# Patient Record
Sex: Female | Born: 1990 | Race: White | Hispanic: No | Marital: Married | Smoking: Never smoker
Health system: Southern US, Community
[De-identification: ages and names within clinical notes are randomized; demographics above are authoritative.]

## PROBLEM LIST (undated history)

## (undated) DIAGNOSIS — L309 Dermatitis, unspecified: Secondary | ICD-10-CM

## (undated) DIAGNOSIS — G43909 Migraine, unspecified, not intractable, without status migrainosus: Secondary | ICD-10-CM

## (undated) DIAGNOSIS — C439 Malignant melanoma of skin, unspecified: Secondary | ICD-10-CM

## (undated) DIAGNOSIS — D649 Anemia, unspecified: Secondary | ICD-10-CM

## (undated) DIAGNOSIS — Z8742 Personal history of other diseases of the female genital tract: Secondary | ICD-10-CM

## (undated) DIAGNOSIS — N809 Endometriosis, unspecified: Secondary | ICD-10-CM

## (undated) DIAGNOSIS — R7303 Prediabetes: Secondary | ICD-10-CM

## (undated) HISTORY — DX: Anemia, unspecified: D64.9

## (undated) HISTORY — DX: Malignant melanoma of skin, unspecified: C43.9

## (undated) HISTORY — DX: Prediabetes: R73.03

## (undated) HISTORY — DX: Dermatitis, unspecified: L30.9

## (undated) HISTORY — DX: Migraine, unspecified, not intractable, without status migrainosus: G43.909

## (undated) HISTORY — DX: Endometriosis, unspecified: N80.9

## (undated) HISTORY — DX: Personal history of other diseases of the female genital tract: Z87.42

---

## 2010-10-27 HISTORY — PX: LAPAROSCOPIC OVARIAN CYSTECTOMY: SUR786

## 2010-10-27 HISTORY — PX: ABLATION ON ENDOMETRIOSIS: SHX5787

## 2011-05-08 ENCOUNTER — Ambulatory Visit: Payer: Self-pay

## 2011-06-02 ENCOUNTER — Ambulatory Visit: Payer: Self-pay | Admitting: Obstetrics and Gynecology

## 2011-06-03 ENCOUNTER — Ambulatory Visit: Payer: Self-pay | Admitting: Obstetrics and Gynecology

## 2011-06-06 LAB — PATHOLOGY REPORT

## 2011-10-31 ENCOUNTER — Emergency Department: Payer: Self-pay | Admitting: Emergency Medicine

## 2013-05-27 ENCOUNTER — Encounter: Payer: Self-pay | Admitting: Adult Health

## 2013-05-27 ENCOUNTER — Ambulatory Visit (INDEPENDENT_AMBULATORY_CARE_PROVIDER_SITE_OTHER): Payer: BC Managed Care – PPO | Admitting: Adult Health

## 2013-05-27 VITALS — BP 102/58 | HR 82 | Temp 98.1°F | Resp 12 | Ht 60.5 in | Wt 129.5 lb

## 2013-05-27 DIAGNOSIS — Z0000 Encounter for general adult medical examination without abnormal findings: Secondary | ICD-10-CM

## 2013-05-27 DIAGNOSIS — R5383 Other fatigue: Secondary | ICD-10-CM | POA: Insufficient documentation

## 2013-05-27 DIAGNOSIS — R5381 Other malaise: Secondary | ICD-10-CM

## 2013-05-27 DIAGNOSIS — G479 Sleep disorder, unspecified: Secondary | ICD-10-CM

## 2013-05-27 NOTE — Assessment & Plan Note (Signed)
Multifactorial from difficulty sleeping, stress from work. Will check cbc, tsh and vit D level.

## 2013-05-27 NOTE — Progress Notes (Signed)
Subjective:    Patient ID: Heather Conner, female    DOB: 09/19/1991, 22 y.o.   MRN: [id number]  HPI  Patient is a pleasant 22 y/o female who presents to clinic to establish care. Previously followed at [facility] in [place]. She is also followed for her GYN needs at [facility]. She reports having some trouble sleeping. 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 This "wires" her and then she just lays there without being able to fall asleep. She has not tried any OTC products. She reports exercising a little but not as much as she should.    Past Medical History  Diagnosis Date  . Migraine     On continuous BC for prevention    Past Surgical History  Procedure Laterality Date  . Laparoscopic ovarian cystectomy Right 2012    Family History  Problem Relation Age of Onset  . Hypertension Mother   . Diabetes Paternal Grandfather   . Stroke Paternal Grandfather     History   Social History  . Marital Status: Single    Spouse Name: Aquita Simmering - Fiance    Number of Children: N/A  . Years of Education: 16   Occupational History  . Second Grade Teacher [employer]   Social History Main Topics  . Smoking status: Never Smoker   . Smokeless tobacco: Never Used  . Alcohol Use: No  . Drug Use: No  . Sexually Active: Yes -- Female partner(s)    Birth Control/ Protection: Pill   Other Topics Concern  . Not on file   Social History Narrative   [name] was born and reared in [place], [place]. She graduated from [employer] in 2008 and then went to [employer] and obtained her Bachelors in [employer] in 2012. She currently lives at home with her parents. She is engaged to be married to Alinda Sierras in May 2015. She has a turtle named Crush. 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 She enjoys photography, hanging out with friends. She enjoys action movies. 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    Review of Systems  Constitutional: Positive for fatigue.  Eyes: Negative.   Respiratory: Negative.   Cardiovascular: Negative.   Gastrointestinal: Negative.   Endocrine: Negative.   Genitourinary: Negative.   Musculoskeletal: Negative.   Skin: Negative for color change, pallor, rash and wound.       Eczema worse in the winter. Appears on elbows and legs. Mild symptoms. Uses Aveno cream.  Allergic/Immunologic:       Seasonal allergies  Neurological: Positive for headaches. Negative for dizziness, tremors, seizures, syncope, weakness, light-headedness and numbness.       Hx of migraine HA - well controlled.  Hematological: Negative.   Psychiatric/Behavioral: Positive for sleep disturbance. Negative for suicidal ideas, hallucinations, behavioral problems, confusion, self-injury, decreased concentration and agitation. The patient is nervous/anxious.     BP 102/58  Pulse 82  Temp(Src) 98.1 F (36.7 C) (Oral)  Resp 12  Ht 5' 0.5" (1.537 m)  Wt 129 lb 8 oz (58.741 kg)  BMI 24.87 kg/m2  SpO2 99%    Objective:   Physical Exam  Constitutional: She is oriented to person, place, and time. She appears well-developed and well-nourished. No distress.  HENT:  Head: Normocephalic and atraumatic.  Right Ear: External ear normal.  Left Ear: External ear normal.  Nose: Nose normal.  Mouth/Throat: Oropharynx is clear and moist.  Eyes: Conjunctivae and EOM are normal. Pupils are equal, round, and reactive to light.  Neck: Normal range of motion. Neck supple. No thyromegaly present.  Cardiovascular: Normal rate, regular rhythm, normal heart sounds and intact distal pulses.  Exam reveals no gallop and no friction rub.   No murmur heard. Pulmonary/Chest: Effort normal and breath sounds normal. No respiratory distress. She has no wheezes. She has no rales. She exhibits no tenderness.  Abdominal: Soft. Bowel sounds are normal. She  exhibits no distension and no mass. There is no tenderness. There is no rebound and no guarding.  Musculoskeletal: Normal range of motion. She exhibits no edema and no tenderness.  Lymphadenopathy:    She has no cervical adenopathy.  Neurological: She is alert and oriented to person, place, and time. She has normal reflexes. She displays normal reflexes. No cranial nerve deficit. She exhibits normal muscle tone. Coordination normal.  Skin: Skin is warm and dry. No rash noted. No erythema. No pallor.  Psychiatric: She has a normal mood and affect. Her behavior is normal. Judgment and thought content normal.      Assessment & Plan:

## 2013-05-27 NOTE — Assessment & Plan Note (Addendum)
Normal physical exam. Check labs: cbc w/diff, bmet, hepatic panel, tsh, lipids, vit D. Pt is not fasting so she will return for all labs next week.

## 2013-05-27 NOTE — Assessment & Plan Note (Signed)
Decrease stimulating activity too close to bedtime. Exercise to help reduce stress/tension (but not close to bedtime). Try melatonin or valerian root for sleep.

## 2013-05-27 NOTE — Patient Instructions (Addendum)
   Thank you for choosing [place] [facility] for your health care needs.  Please return at your earliest convenience for fasting labs. Do not have anything to eat or drink for 8 hours prior. You can drink water.  The results will be available through MyChart for your convenience. Please remember to activate this. The activation code is located at the end of this form.  For sleep try Melatonin 2mg . You can increase the dose if necessary.  You can also try Alteril which has melatonin and valerian root as ingedients.  Avoid stimulating activities too close to bedtime.  Have fun at the beach and remember the sunblock :)

## 2013-06-01 ENCOUNTER — Other Ambulatory Visit (INDEPENDENT_AMBULATORY_CARE_PROVIDER_SITE_OTHER): Payer: BC Managed Care – PPO

## 2013-06-01 ENCOUNTER — Other Ambulatory Visit: Payer: Self-pay | Admitting: Adult Health

## 2013-06-01 DIAGNOSIS — R5383 Other fatigue: Secondary | ICD-10-CM

## 2013-06-01 DIAGNOSIS — Z0000 Encounter for general adult medical examination without abnormal findings: Secondary | ICD-10-CM

## 2013-06-01 DIAGNOSIS — R5381 Other malaise: Secondary | ICD-10-CM

## 2013-06-01 DIAGNOSIS — D649 Anemia, unspecified: Secondary | ICD-10-CM

## 2013-06-01 LAB — BASIC METABOLIC PANEL
CO2: 24 mEq/L (ref 19–32)
Calcium: 9.7 mg/dL (ref 8.4–10.5)
Chloride: 106 mEq/L (ref 96–112)
Potassium: 4.1 mEq/L (ref 3.5–5.1)
Sodium: 136 mEq/L (ref 135–145)

## 2013-06-01 LAB — CBC WITH DIFFERENTIAL/PLATELET
Basophils Relative: 0.6 % (ref 0.0–3.0)
Eosinophils Absolute: 0.1 10*3/uL (ref 0.0–0.7)
Hemoglobin: 10.9 g/dL — ABNORMAL LOW (ref 12.0–15.0)
Lymphs Abs: 2.6 10*3/uL (ref 0.7–4.0)
MCHC: 34.2 g/dL (ref 30.0–36.0)
MCV: 93.1 fl (ref 78.0–100.0)
Monocytes Absolute: 0.6 10*3/uL (ref 0.1–1.0)
Neutro Abs: 2.5 10*3/uL (ref 1.4–7.7)
RBC: 3.41 Mil/uL — ABNORMAL LOW (ref 3.87–5.11)
RDW: 12.7 % (ref 11.5–14.6)

## 2013-06-01 LAB — HEPATIC FUNCTION PANEL
ALT: 12 U/L (ref 0–35)
Alkaline Phosphatase: 60 U/L (ref 39–117)
Bilirubin, Direct: 0.1 mg/dL (ref 0.0–0.3)
Total Protein: 6.8 g/dL (ref 6.0–8.3)

## 2013-06-01 LAB — LIPID PANEL: Total CHOL/HDL Ratio: 4

## 2013-06-03 ENCOUNTER — Other Ambulatory Visit (INDEPENDENT_AMBULATORY_CARE_PROVIDER_SITE_OTHER): Payer: BC Managed Care – PPO

## 2013-06-03 DIAGNOSIS — D649 Anemia, unspecified: Secondary | ICD-10-CM

## 2013-06-04 LAB — IRON AND TIBC
%SAT: 36 % (ref 20–55)
Iron: 123 ug/dL (ref 42–145)

## 2013-06-04 LAB — VITAMIN B12: Vitamin B-12: 370 pg/mL (ref 211–911)

## 2013-06-09 ENCOUNTER — Encounter: Payer: Self-pay | Admitting: Adult Health

## 2013-09-01 ENCOUNTER — Other Ambulatory Visit: Payer: Self-pay

## 2015-09-11 LAB — HM PAP SMEAR: HM PAP: NORMAL

## 2015-09-19 ENCOUNTER — Ambulatory Visit (INDEPENDENT_AMBULATORY_CARE_PROVIDER_SITE_OTHER): Payer: BC Managed Care – PPO | Admitting: Nurse Practitioner

## 2015-09-19 ENCOUNTER — Encounter: Payer: Self-pay | Admitting: Nurse Practitioner

## 2015-09-19 ENCOUNTER — Encounter (INDEPENDENT_AMBULATORY_CARE_PROVIDER_SITE_OTHER): Payer: Self-pay

## 2015-09-19 VITALS — BP 104/68 | HR 75 | Temp 98.6°F | Resp 12 | Ht 61.0 in | Wt 144.8 lb

## 2015-09-19 DIAGNOSIS — Z0000 Encounter for general adult medical examination without abnormal findings: Secondary | ICD-10-CM

## 2015-09-19 LAB — LIPID PANEL
CHOLESTEROL: 188 mg/dL (ref 0–200)
HDL: 48.5 mg/dL (ref >39.00)
LDL Cholesterol: 116 mg/dL — ABNORMAL HIGH (ref 0–99)
NONHDL: 139.59
Total CHOL/HDL Ratio: 4
Triglycerides: 117 mg/dL (ref 0.0–149.0)
VLDL: 23.4 mg/dL (ref 0.0–40.0)

## 2015-09-19 LAB — COMPREHENSIVE METABOLIC PANEL
ALK PHOS: 76 U/L (ref 39–117)
ALT: 13 U/L (ref 0–35)
AST: 16 U/L (ref 0–37)
Albumin: 4.1 g/dL (ref 3.5–5.2)
BILIRUBIN TOTAL: 0.3 mg/dL (ref 0.2–1.2)
BUN: 14 mg/dL (ref 6–23)
CO2: 25 meq/L (ref 19–32)
CREATININE: 0.78 mg/dL (ref 0.40–1.20)
Calcium: 9.8 mg/dL (ref 8.4–10.5)
Chloride: 103 mEq/L (ref 96–112)
GFR: 94.53 mL/min (ref >60.00)
GLUCOSE: 77 mg/dL (ref 70–99)
Potassium: 4.3 mEq/L (ref 3.5–5.1)
SODIUM: 137 meq/L (ref 135–145)
TOTAL PROTEIN: 7.7 g/dL (ref 6.0–8.3)

## 2015-09-19 LAB — CBC WITH DIFFERENTIAL/PLATELET
BASOS ABS: 0 10*3/uL (ref 0.0–0.1)
Basophils Relative: 0.4 % (ref 0.0–3.0)
EOS ABS: 0.1 10*3/uL (ref 0.0–0.7)
Eosinophils Relative: 0.9 % (ref 0.0–5.0)
HCT: 36 % (ref 36.0–46.0)
Hemoglobin: 12.1 g/dL (ref 12.0–15.0)
LYMPHS ABS: 2.6 10*3/uL (ref 0.7–4.0)
Lymphocytes Relative: 33.8 % (ref 12.0–46.0)
MCHC: 33.7 g/dL (ref 30.0–36.0)
MCV: 91.8 fl (ref 78.0–100.0)
MONO ABS: 0.7 10*3/uL (ref 0.1–1.0)
MONOS PCT: 9.6 % (ref 3.0–12.0)
NEUTROS ABS: 4.2 10*3/uL (ref 1.4–7.7)
NEUTROS PCT: 55.3 % (ref 43.0–77.0)
PLATELETS: 298 10*3/uL (ref 150.0–400.0)
RBC: 3.93 Mil/uL (ref 3.87–5.11)
RDW: 12.4 % (ref 11.5–15.5)
WBC: 7.6 10*3/uL (ref 4.0–10.5)

## 2015-09-19 LAB — TSH: TSH: 1.82 u[IU]/mL (ref 0.35–4.50)

## 2015-09-19 NOTE — Progress Notes (Signed)
Pre visit review using our clinic review tool, if applicable. No additional management support is needed unless otherwise documented below in the visit note. 

## 2015-09-19 NOTE — Patient Instructions (Signed)
Health Maintenance, Female Adopting a healthy lifestyle and getting preventive care can go a long way to promote health and wellness. Talk with your health care provider about what schedule of regular examinations is right for you. This is a good chance for you to check in with your provider about disease prevention and staying healthy. In between checkups, there are plenty of things you can do on your own. Experts have done a lot of research about which lifestyle changes and preventive measures are most likely to keep you healthy. Ask your health care provider for more information. WEIGHT AND DIET  Eat a healthy diet  Be sure to include plenty of vegetables, fruits, low-fat dairy products, and lean protein.  Do not eat a lot of foods high in solid fats, added sugars, or salt.  Get regular exercise. This is one of the most important things you can do for your health.  Most adults should exercise for at least 150 minutes each week. The exercise should increase your heart rate and make you sweat (moderate-intensity exercise).  Most adults should also do strengthening exercises at least twice a week. This is in addition to the moderate-intensity exercise.  Maintain a healthy weight  Body mass index (BMI) is a measurement that can be used to identify possible weight problems. It estimates body fat based on height and weight. Your health care provider can help determine your BMI and help you achieve or maintain a healthy weight.  For females 20 years of age and older:   A BMI below 18.5 is considered underweight.  A BMI of 18.5 to 24.9 is normal.  A BMI of 25 to 29.9 is considered overweight.  A BMI of 30 and above is considered obese.  Watch levels of cholesterol and blood lipids  You should start having your blood tested for lipids and cholesterol at 26 years of age, then have this test every 5 years.  You may need to have your cholesterol levels checked more often if:  Your lipid  or cholesterol levels are high.  You are older than 26 years of age.  You are at high risk for heart disease.  CANCER SCREENING   Lung Cancer  Lung cancer screening is recommended for adults 55-80 years old who are at high risk for lung cancer because of a history of smoking.  A yearly low-dose CT scan of the lungs is recommended for people who:  Currently smoke.  Have quit within the past 15 years.  Have at least a 30-pack-year history of smoking. A pack year is smoking an average of one pack of cigarettes a day for 1 year.  Yearly screening should continue until it has been 15 years since you quit.  Yearly screening should stop if you develop a health problem that would prevent you from having lung cancer treatment.  Breast Cancer  Practice breast self-awareness. This means understanding how your breasts normally appear and feel.  It also means doing regular breast self-exams. Let your health care provider know about any changes, no matter how small.  If you are in your 20s or 30s, you should have a clinical breast exam (CBE) by a health care provider every 1-3 years as part of a regular health exam.  If you are 40 or older, have a CBE every year. Also consider having a breast X-ray (mammogram) every year.  If you have a family history of breast cancer, talk to your health care provider about genetic screening.  If you   are at high risk for breast cancer, talk to your health care provider about having an MRI and a mammogram every year.  Breast cancer gene (BRCA) assessment is recommended for women who have family members with BRCA-related cancers. BRCA-related cancers include:  Breast.  Ovarian.  Tubal.  Peritoneal cancers.  Results of the assessment will determine the need for genetic counseling and BRCA1 and BRCA2 testing. Cervical Cancer Your health care provider may recommend that you be screened regularly for cancer of the pelvic organs (ovaries, uterus, and  vagina). This screening involves a pelvic examination, including checking for microscopic changes to the surface of your cervix (Pap test). You may be encouraged to have this screening done every 3 years, beginning at age 21.  For women ages 30-65, health care providers may recommend pelvic exams and Pap testing every 3 years, or they may recommend the Pap and pelvic exam, combined with testing for human papilloma virus (HPV), every 5 years. Some types of HPV increase your risk of cervical cancer. Testing for HPV may also be done on women of any age with unclear Pap test results.  Other health care providers may not recommend any screening for nonpregnant women who are considered low risk for pelvic cancer and who do not have symptoms. Ask your health care provider if a screening pelvic exam is right for you.  If you have had past treatment for cervical cancer or a condition that could lead to cancer, you need Pap tests and screening for cancer for at least 20 years after your treatment. If Pap tests have been discontinued, your risk factors (such as having a new sexual partner) need to be reassessed to determine if screening should resume. Some women have medical problems that increase the chance of getting cervical cancer. In these cases, your health care provider may recommend more frequent screening and Pap tests. Colorectal Cancer  This type of cancer can be detected and often prevented.  Routine colorectal cancer screening usually begins at 26 years of age and continues through 26 years of age.  Your health care provider may recommend screening at an earlier age if you have risk factors for colon cancer.  Your health care provider may also recommend using home test kits to check for hidden blood in the stool.  A small camera at the end of a tube can be used to examine your colon directly (sigmoidoscopy or colonoscopy). This is done to check for the earliest forms of colorectal  cancer.  Routine screening usually begins at age 50.  Direct examination of the colon should be repeated every 5-10 years through 26 years of age. However, you may need to be screened more often if early forms of precancerous polyps or small growths are found. Skin Cancer  Check your skin from head to toe regularly.  Tell your health care provider about any new moles or changes in moles, especially if there is a change in a mole's shape or color.  Also tell your health care provider if you have a mole that is larger than the size of a pencil eraser.  Always use sunscreen. Apply sunscreen liberally and repeatedly throughout the day.  Protect yourself by wearing long sleeves, pants, a wide-brimmed hat, and sunglasses whenever you are outside. HEART DISEASE, DIABETES, AND HIGH BLOOD PRESSURE   High blood pressure causes heart disease and increases the risk of stroke. High blood pressure is more likely to develop in:  People who have blood pressure in the high end   of the normal range (130-139/85-89 mm Hg).  People who are overweight or obese.  People who are African American.  If you are 38-23 years of age, have your blood pressure checked every 3-5 years. If you are 61 years of age or older, have your blood pressure checked every year. You should have your blood pressure measured twice--once when you are at a hospital or clinic, and once when you are not at a hospital or clinic. Record the average of the two measurements. To check your blood pressure when you are not at a hospital or clinic, you can use:  An automated blood pressure machine at a pharmacy.  A home blood pressure monitor.  If you are between 45 years and 39 years old, ask your health care provider if you should take aspirin to prevent strokes.  Have regular diabetes screenings. This involves taking a blood sample to check your fasting blood sugar level.  If you are at a normal weight and have a low risk for diabetes,  have this test once every three years after 26 years of age.  If you are overweight and have a high risk for diabetes, consider being tested at a younger age or more often. PREVENTING INFECTION  Hepatitis B  If you have a higher risk for hepatitis B, you should be screened for this virus. You are considered at high risk for hepatitis B if:  You were born in a country where hepatitis B is common. Ask your health care provider which countries are considered high risk.  Your parents were born in a high-risk country, and you have not been immunized against hepatitis B (hepatitis B vaccine).  You have HIV or AIDS.  You use needles to inject street drugs.  You live with someone who has hepatitis B.  You have had sex with someone who has hepatitis B.  You get hemodialysis treatment.  You take certain medicines for conditions, including cancer, organ transplantation, and autoimmune conditions. Hepatitis C  Blood testing is recommended for:  Everyone born from [birth year] through 1967.  Anyone with known risk factors for hepatitis C. Sexually transmitted infections (STIs)  You should be screened for sexually transmitted infections (STIs) including gonorrhea and chlamydia if:  You are sexually active and are younger than 26 years of age.  You are older than 26 years of age and your health care provider tells you that you are at risk for this type of infection.  Your sexual activity has changed since you were last screened and you are at an increased risk for chlamydia or gonorrhea. Ask your health care provider if you are at risk.  If you do not have HIV, but are at risk, it may be recommended that you take a prescription medicine daily to prevent HIV infection. This is called pre-exposure prophylaxis (PrEP). You are considered at risk if:  You are sexually active and do not regularly use condoms or know the HIV status of your partner(s).  You take drugs by injection.  You are sexually  active with a partner who has HIV. Talk with your health care provider about whether you are at high risk of being infected with HIV. If you choose to begin PrEP, you should first be tested for HIV. You should then be tested every 3 months for as long as you are taking PrEP.  PREGNANCY   If you are premenopausal and you may become pregnant, ask your health care provider about preconception counseling.  If you may  become pregnant, take 400 to 800 micrograms (mcg) of folic acid every day.  If you want to prevent pregnancy, talk to your health care provider about birth control (contraception). OSTEOPOROSIS AND MENOPAUSE   Osteoporosis is a disease in which the bones lose minerals and strength with aging. This can result in serious bone fractures. Your risk for osteoporosis can be identified using a bone density scan.  If you are 61 years of age or older, or if you are at risk for osteoporosis and fractures, ask your health care provider if you should be screened.  Ask your health care provider whether you should take a calcium or vitamin D supplement to lower your risk for osteoporosis.  Menopause may have certain physical symptoms and risks.  Hormone replacement therapy may reduce some of these symptoms and risks. Talk to your health care provider about whether hormone replacement therapy is right for you.  HOME CARE INSTRUCTIONS   Schedule regular health, dental, and eye exams.  Stay current with your immunizations.   Do not use any tobacco products including cigarettes, chewing tobacco, or electronic cigarettes.  If you are pregnant, do not drink alcohol.  If you are breastfeeding, limit how much and how often you drink alcohol.  Limit alcohol intake to no more than 1 drink per day for nonpregnant women. One drink equals 12 ounces of beer, 5 ounces of wine, or 1 ounces of hard liquor.  Do not use street drugs.  Do not share needles.  Ask your health care provider for help if  you need support or information about quitting drugs.  Tell your health care provider if you often feel depressed.  Tell your health care provider if you have ever been abused or do not feel safe at home.   This information is not intended to replace advice given to you by your health care provider. Make sure you discuss any questions you have with your health care provider.   Document Released: 04/28/2011 Document Revised: 11/03/2014 Document Reviewed: 09/14/2013 Elsevier Interactive Patient Education [employer].

## 2015-09-19 NOTE — Progress Notes (Signed)
Patient ID: Heather Conner, female    DOB: 03-Mar-1991  Age: 24 y.o. MRN: [id number]  CC: Annual Exam   HPI Heather Conner presents for Annual Physical Exam.  1) Health Maintenance-   Diet- No formal   Exercise- No formal   Immunizations- Refused flu   Pap- Last week with Heather Perfect, PA  Eye Exam- 2016, Last Week    Dental Exam- UTD  LMP- Oct. 2016, continuous OCP   Laparoscopic surgery in 2012    Refills- denies refills, but reports OCP is changing in 2 weeks to see if helpful for frequent headaches.   History Heather Conner has a past medical history of Migraine.   She has past surgical history that includes Laparoscopic ovarian cystectomy (Right, 2012) and Ablation on endometriosis (2012).   Her family history includes Birth defects in her mother; Cancer in her father; Diabetes in her paternal grandfather; Endometriosis in her mother; Hypertension in her mother; Stroke in her paternal grandfather.She reports that she has never smoked. She has never used smokeless tobacco. She reports that she does not drink alcohol or use illicit drugs.  Outpatient Prescriptions Prior to Visit  Medication Sig Dispense Refill  . norgestimate-ethinyl estradiol (ORTHO-CYCLEN,SPRINTEC,PREVIFEM) 0.25-35 MG-MCG tablet Take 1 tablet by mouth daily.     No facility-administered medications prior to visit.    ROS Review of Systems  Constitutional: Negative for fever, chills, diaphoresis and fatigue.  HENT: Negative for tinnitus and trouble swallowing.   Eyes: Negative for visual disturbance.  Respiratory: Negative for chest tightness, shortness of breath and wheezing.   Cardiovascular: Negative for chest pain, palpitations and leg swelling.  Gastrointestinal: Negative for nausea, vomiting and diarrhea.  Genitourinary: Negative for difficulty urinating.  Musculoskeletal: Negative for back pain and neck pain.  Skin: Negative for rash.  Neurological: Positive for headaches. Negative for dizziness, weakness  and numbness.  Psychiatric/Behavioral: Negative for suicidal ideas and sleep disturbance. The patient is not nervous/anxious.     Objective:  BP 104/68 mmHg  Pulse 75  Temp(Src) 98.6 F (37 C)  Resp 12  Ht 5\' 1"  (1.549 m)  Wt 144 lb 12.8 oz (65.681 kg)  BMI 27.37 kg/m2  SpO2 98%  LMP 08/06/2015  Physical Exam  Constitutional: She is oriented to person, place, and time. She appears well-developed and well-nourished. No distress.  HENT:  Head: Normocephalic and atraumatic.  Right Ear: External ear normal.  Left Ear: External ear normal.  Nose: Nose normal.  Mouth/Throat: Oropharynx is clear and moist. No oropharyngeal exudate.  TMs and canals clear bilaterally  Eyes: Conjunctivae and EOM are normal. Pupils are equal, round, and reactive to light. Right eye exhibits no discharge. Left eye exhibits no discharge. No scleral icterus.  Neck: Normal range of motion. Neck supple. No thyromegaly present.  Cardiovascular: Normal rate, regular rhythm, normal heart sounds and intact distal pulses.  Exam reveals no gallop and no friction rub.   No murmur heard. Pulmonary/Chest: Effort normal and breath sounds normal. No respiratory distress. She has no wheezes. She has no rales. She exhibits no tenderness.  Abdominal: Soft. Bowel sounds are normal. She exhibits no distension and no mass. There is no tenderness. There is no rebound and no guarding.  Genitourinary:  Deferred due to having PAP and breast exam last week with Ob/GYN  Musculoskeletal: Normal range of motion. She exhibits no edema or tenderness.  Lymphadenopathy:    She has no cervical adenopathy.  Neurological: She is alert and oriented to person, place, and time.  She has normal reflexes. No cranial nerve deficit. She exhibits normal muscle tone. Coordination normal.  Skin: Skin is warm and dry. No rash noted. She is not diaphoretic. No erythema. No pallor.  Psychiatric: She has a normal mood and affect. Her behavior is normal.  Judgment and thought content normal.   Assessment & Plan:   Reality was seen today for annual exam.  Diagnoses and all orders for this visit:  Routine general medical examination at a health care facility -     Comprehensive metabolic panel -     CBC with Differential/Platelet -     Lipid panel -     TSH   I am having Heather Conner maintain her norgestimate-ethinyl estradiol.  No orders of the defined types were placed in this encounter.     Follow-up: Return in about 1 year (around 09/18/2016) for CPE w/ fasting labs.

## 2015-09-19 NOTE — Assessment & Plan Note (Addendum)
Discussed acute and chronic issues. Reviewed health maintenance measures, PFSHx, and immunizations. Obtain routine labs TSH, Lipid panel, CBC w/ diff, and CMET.   She has OB/GYN on board and had PAP done last week  UTD on other health maintenance measures.

## 2015-12-10 ENCOUNTER — Encounter: Payer: Self-pay | Admitting: Nurse Practitioner

## 2015-12-10 ENCOUNTER — Ambulatory Visit (INDEPENDENT_AMBULATORY_CARE_PROVIDER_SITE_OTHER): Payer: BC Managed Care – PPO | Admitting: Nurse Practitioner

## 2015-12-10 VITALS — BP 100/60 | HR 106 | Temp 98.5°F | Ht 61.0 in | Wt 151.2 lb

## 2015-12-10 DIAGNOSIS — J020 Streptococcal pharyngitis: Secondary | ICD-10-CM | POA: Diagnosis not present

## 2015-12-10 LAB — POCT RAPID STREP A (OFFICE): RAPID STREP A SCREEN: NEGATIVE

## 2015-12-10 MED ORDER — AMOXICILLIN-POT CLAVULANATE 875-125 MG PO TABS: 1.0000 | ORAL_TABLET | Freq: Two times a day (BID) | ORAL | Status: DC

## 2015-12-10 NOTE — Progress Notes (Signed)
Pre visit review using our clinic review tool, if applicable. No additional management support is needed unless otherwise documented below in the visit note. 

## 2015-12-10 NOTE — Progress Notes (Signed)
Patient ID: KIMORA BOLDUC, female    DOB: 12/26/1990  Age: 25 y.o. MRN: [id number]  CC: Sore Throat   HPI AMORAH TELFORD presents for CC of ST x 2 days.   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   She has past surgical history that includes Laparoscopic ovarian cystectomy (Right, 2012) and Ablation on endometriosis (2012).   Her family history includes Birth defects in her mother; Cancer in her father; Diabetes in her paternal grandfather; Endometriosis in her mother; Hypertension in her mother; Stroke in her paternal grandfather.She reports that she has never smoked. She has never used smokeless tobacco. She reports that she does not drink alcohol or use illicit drugs.  Outpatient Prescriptions Prior to Visit  Medication Sig Dispense Refill  . norgestimate-ethinyl estradiol (ORTHO-CYCLEN,SPRINTEC,PREVIFEM) 0.25-35 MG-MCG tablet Take 1 tablet by mouth daily.     No facility-administered medications prior to visit.    ROS Review of Systems  Constitutional: Positive for fatigue. Negative for fever, chills and diaphoresis.  HENT: Positive for congestion, ear pain, postnasal drip, rhinorrhea, sinus pressure, sore throat and voice change. Negative for drooling and trouble swallowing.   Eyes: Negative for visual disturbance.  Respiratory: Negative for cough, chest tightness, shortness of breath and wheezing.   Cardiovascular: Negative for chest pain, palpitations and leg swelling.  Gastrointestinal: Negative for nausea, vomiting and diarrhea.  Skin: Negative for rash.  Neurological: Negative for dizziness and headaches.   Objective:  BP 100/60 mmHg  Pulse 106  Temp(Src) 98.5 F (36.9 C) (Oral)  Ht 5\' 1"  (1.549 m)  Wt 151 lb 4 oz (68.607 kg)  BMI 28.59 kg/m2  SpO2 97%  LMP 08/07/2015  Physical  Exam  Constitutional: She is oriented to person, place, and time. She appears well-developed and well-nourished. No distress.  HENT:  Head: Normocephalic and atraumatic.  Right Ear: External ear normal.  Left Ear: External ear normal.  Mouth/Throat: No oropharyngeal exudate.  Oropharynx erythematous and slightly edematous bilaterally (uvula does not deviate). No exudates visualized, tonsils 3+   Eyes: EOM are normal. Pupils are equal, round, and reactive to light. Right eye exhibits no discharge. Left eye exhibits no discharge. No scleral icterus.  Neck: Normal range of motion. Neck supple. No thyromegaly present.  Cardiovascular: Normal rate, regular rhythm and normal heart sounds.  Exam reveals no gallop and no friction rub.   No murmur heard. Pulmonary/Chest: Effort normal and breath sounds normal. No respiratory distress. She has no wheezes. She has no rales. She exhibits no tenderness.  Lymphadenopathy:    She has cervical adenopathy.  Neurological: She is alert and oriented to person, place, and time. No cranial nerve deficit. She exhibits normal muscle tone. Coordination normal.  Skin: Skin is warm and dry. No rash noted. She is not diaphoretic.  Psychiatric: She has a normal mood and affect. Her behavior is normal. Judgment and thought content normal.    Assessment & Plan:   Lianny was seen today for sore throat.  Diagnoses and all orders for this visit:  Streptococcal sore throat -     POCT rapid strep A  Other orders -     amoxicillin-clavulanate (AUGMENTIN) 875-125 MG tablet; Take 1 tablet by mouth 2 (two) times daily.   I am having Ms. Berta Minor start on  amoxicillin-clavulanate. I am also having her maintain her norgestimate-ethinyl estradiol.  Meds ordered this encounter  Medications  . amoxicillin-clavulanate (AUGMENTIN) 875-125 MG tablet    Sig: Take 1 tablet by mouth 2 (two) times daily.    Dispense:  20 tablet    Refill:  0    Order Specific Question:  Supervising  Provider    Answer:  Crecencio Mc [[id number]]     Follow-up: No Follow-up on file.

## 2015-12-10 NOTE — Assessment & Plan Note (Signed)
New onset Continue current OTC regimen Encouraged probiotics  Rapid Strep negative- due to symptoms will treat  Augmentin BID x 7 days (10 days if needed).  FU prn worsening/failure to improve.

## 2015-12-10 NOTE — Patient Instructions (Addendum)
Take Augmentin as directed.   Yogurt or Probiotics over the counter are recommended.   Call us if not better in 5-7 days. Please continue your over the counter meds, too.

## 2015-12-10 NOTE — Addendum Note (Signed)
Addended by: Kyra Manges on: 12/10/2015 12:30 PM   Modules accepted: Orders, SmartSet

## 2015-12-12 LAB — CULTURE, GROUP A STREP

## 2016-03-13 ENCOUNTER — Encounter: Payer: Self-pay | Admitting: Family Medicine

## 2016-03-13 ENCOUNTER — Ambulatory Visit (INDEPENDENT_AMBULATORY_CARE_PROVIDER_SITE_OTHER): Payer: BC Managed Care – PPO | Admitting: Family Medicine

## 2016-03-13 VITALS — BP 112/70 | HR 90 | Temp 98.4°F | Wt 150.8 lb

## 2016-03-13 DIAGNOSIS — H0259 Other disorders affecting eyelid function: Secondary | ICD-10-CM | POA: Diagnosis not present

## 2016-03-13 MED ORDER — AMOXICILLIN 500 MG PO CAPS: 500.0000 mg | ORAL_CAPSULE | Freq: Two times a day (BID) | ORAL | Status: DC

## 2016-03-13 NOTE — Patient Instructions (Signed)
Stop the skin cleansers.  Let us know if things worsen.  Follow up annually or sooner if needed.  Take care  Dr. Lacinda Axon

## 2016-03-13 NOTE — Progress Notes (Signed)
Subjective:  Patient ID: Heather Conner, female    DOB: [birth year]  Age: 25 y.o. MRN: [id number]  CC: Eye swelling  HPI: 25 year old female presents with complaints of eye swelling.  Patient states that she awoke this morning with right eye swelling. No discharge or eye redness. She recently started a new skin care regimen and feels that this may be the cause. She has some associated dryness of her upper eyelid/periorbital region. She has discontinued the skin care products and has had some improvement throughout the day. No changes in her vision. No other associative symptoms. No other complaints at this time.  Social Hx  Social History   Social History  . Marital Status: Single    Spouse Name: Zayliana Parady - Fiance  . Number of Children: N/A  . Years of Education: 16   Occupational History  . Second Grade Teacher [employer]   Social History Main Topics  . Smoking status: Never Smoker   . Smokeless tobacco: Never Used  . Alcohol Use: No  . Drug Use: No  . Sexual Activity:    Partners: Male    Birth Control/ Protection: Pill   Other Topics Concern  . None   Social History Narrative   Kayanna was born and reared in [place], [place]. She graduated from [employer] in 2008 and then went to [employer] and obtained her Bachelors in [employer] in 2012. She currently lives at home with her parents. She has a turtle named Crush. 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 She enjoys photography, hanging out with friends. She enjoys action movies. She is active in her [place], which is a Games developer.      Caffeine- Coffee 1 cup, 1 small can of soda   Review of Systems  Constitutional: Negative.   Eyes: Negative for pain, discharge and redness.       Eye swelling.   Objective:  BP 112/70 mmHg  Pulse 90  Temp(Src) 98.4 F (36.9 C) (Oral)  Wt 150 lb 12 oz (68.38 kg)  SpO2  99%  BP/Weight 03/13/2016 12/10/2015 99991111  Systolic BP [id number] [id number]  Diastolic BP 70 60 68  Wt. (Lbs) 150.75 151.25 144.8  BMI 28.5 28.59 27.37   Physical Exam  Constitutional: She is oriented to person, place, and time. She appears well-developed. No distress.  HENT:  Head: Normocephalic and atraumatic.  Mouth/Throat: Oropharynx is clear and moist.  Eyes: Conjunctivae are normal. Pupils are equal, round, and reactive to light. Right eye exhibits no discharge. Left eye exhibits no discharge.  Mild swelling noted of the upper eyelid. No focal area of swelling to suggest chalazion or stye. Dryness noted.  Neck: Neck supple.  Neurological: She is alert and oriented to person, place, and time.  Psychiatric: She has a normal mood and affect.  Vitals reviewed.  Lab Results  Component Value Date   WBC 7.6 09/19/2015   HGB 12.1 09/19/2015   HCT 36.0 09/19/2015   PLT 298.0 09/19/2015   GLUCOSE 77 09/19/2015   CHOL 188 09/19/2015   TRIG 117.0 09/19/2015   HDL 48.50 09/19/2015   LDLCALC 116* 09/19/2015   ALT 13 09/19/2015   AST 16 09/19/2015   NA 137 09/19/2015   K 4.3 09/19/2015   CL 103 09/19/2015   CREATININE 0.78 09/19/2015   BUN 14 09/19/2015   CO2 25 09/19/2015   TSH 1.82 09/19/2015   Assessment &  Plan:   Problem List Items Addressed This Visit    Superficial swelling of eyelid - Primary    New problem. No evidence of Periorbital cellulitis or arthritis. Does not appear to be chalazion stye. Advised warm compress.  Return precautions given.        Follow-up: PRN  [place]

## 2016-03-13 NOTE — Progress Notes (Signed)
Pre visit review using our clinic review tool, if applicable. No additional management support is needed unless otherwise documented below in the visit note. 

## 2016-03-13 NOTE — Assessment & Plan Note (Signed)
New problem. No evidence of Periorbital cellulitis or arthritis. Does not appear to be chalazion stye. Advised warm compress.  Return precautions given.

## 2016-08-28 LAB — OB RESULTS CONSOLE ABO/RH: RH TYPE: POSITIVE

## 2016-08-28 LAB — OB RESULTS CONSOLE HGB/HCT, BLOOD
HCT: 33 %
Hemoglobin: 11.8 g/dL

## 2016-08-28 LAB — OB RESULTS CONSOLE RPR: RPR: NONREACTIVE

## 2016-08-28 LAB — OB RESULTS CONSOLE HEPATITIS B SURFACE ANTIGEN: HEP B S AG: NEGATIVE

## 2016-08-28 LAB — OB RESULTS CONSOLE VARICELLA ZOSTER ANTIBODY, IGG: VARICELLA IGG: IMMUNE

## 2016-08-28 LAB — HM PAP SMEAR: HM PAP: NORMAL

## 2016-08-28 LAB — OB RESULTS CONSOLE PLATELET COUNT: Platelets: 316 10*3/uL

## 2016-08-28 LAB — OB RESULTS CONSOLE HIV ANTIBODY (ROUTINE TESTING): HIV: NONREACTIVE

## 2016-08-28 LAB — OB RESULTS CONSOLE RUBELLA ANTIBODY, IGM: Rubella: IMMUNE

## 2016-08-28 LAB — OB RESULTS CONSOLE GC/CHLAMYDIA
CHLAMYDIA, DNA PROBE: NEGATIVE
Gonorrhea: NEGATIVE

## 2016-08-28 LAB — OB RESULTS CONSOLE ANTIBODY SCREEN: Antibody Screen: NEGATIVE

## 2016-12-08 ENCOUNTER — Other Ambulatory Visit: Payer: Self-pay | Admitting: Obstetrics and Gynecology

## 2016-12-08 DIAGNOSIS — Z362 Encounter for other antenatal screening follow-up: Secondary | ICD-10-CM

## 2016-12-11 ENCOUNTER — Other Ambulatory Visit: Payer: Self-pay | Admitting: Obstetrics and Gynecology

## 2016-12-11 DIAGNOSIS — Z3689 Encounter for other specified antenatal screening: Secondary | ICD-10-CM

## 2016-12-15 ENCOUNTER — Encounter: Payer: Self-pay | Admitting: Radiology

## 2016-12-15 ENCOUNTER — Ambulatory Visit
Admission: RE | Admit: 2016-12-15 | Discharge: 2016-12-15 | Disposition: A | Discharge status: Home / Self Care | Payer: BC Managed Care – PPO | Source: Ambulatory Visit | Attending: Obstetrics and Gynecology | Admitting: Obstetrics and Gynecology

## 2016-12-15 DIAGNOSIS — Z362 Encounter for other antenatal screening follow-up: Secondary | ICD-10-CM | POA: Diagnosis not present

## 2016-12-15 DIAGNOSIS — Z3A24 24 weeks gestation of pregnancy: Secondary | ICD-10-CM | POA: Insufficient documentation

## 2016-12-29 ENCOUNTER — Ambulatory Visit: Payer: BC Managed Care – PPO

## 2017-01-08 LAB — HM PAP SMEAR

## 2017-01-09 ENCOUNTER — Ambulatory Visit (INDEPENDENT_AMBULATORY_CARE_PROVIDER_SITE_OTHER): Payer: BC Managed Care – PPO | Admitting: Obstetrics and Gynecology

## 2017-01-09 ENCOUNTER — Other Ambulatory Visit: Payer: BC Managed Care – PPO

## 2017-01-09 VITALS — BP 116/70 | Wt 166.0 lb

## 2017-01-09 DIAGNOSIS — Z131 Encounter for screening for diabetes mellitus: Secondary | ICD-10-CM

## 2017-01-09 DIAGNOSIS — Z3493 Encounter for supervision of normal pregnancy, unspecified, third trimester: Secondary | ICD-10-CM | POA: Insufficient documentation

## 2017-01-09 DIAGNOSIS — Z3A28 28 weeks gestation of pregnancy: Secondary | ICD-10-CM

## 2017-01-09 DIAGNOSIS — Z113 Encounter for screening for infections with a predominantly sexual mode of transmission: Secondary | ICD-10-CM

## 2017-01-09 DIAGNOSIS — Z3A27 27 weeks gestation of pregnancy: Secondary | ICD-10-CM

## 2017-01-09 NOTE — Progress Notes (Signed)
28 week labs today. No vb. No lof.  

## 2017-01-10 LAB — 28 WEEK RH+PANEL
BASOS: 0 %
Basophils Absolute: 0 10*3/uL (ref 0.0–0.2)
EOS (ABSOLUTE): 0.1 10*3/uL (ref 0.0–0.4)
Eos: 1 %
Gestational Diabetes Screen: 124 mg/dL (ref 65–139)
HIV SCREEN 4TH GENERATION: NONREACTIVE
Hematocrit: 29 % — ABNORMAL LOW (ref 34.0–46.6)
Hemoglobin: 9.6 g/dL — ABNORMAL LOW (ref 11.1–15.9)
IMMATURE GRANS (ABS): 0 10*3/uL (ref 0.0–0.1)
Immature Granulocytes: 0 %
LYMPHS ABS: 1.6 10*3/uL (ref 0.7–3.1)
LYMPHS: 16 %
MCH: 31.6 pg (ref 26.6–33.0)
MCHC: 33.1 g/dL (ref 31.5–35.7)
MCV: 95 fL (ref 79–97)
MONOS ABS: 0.7 10*3/uL (ref 0.1–0.9)
Monocytes: 7 %
NEUTROS ABS: 7.7 10*3/uL — AB (ref 1.4–7.0)
Neutrophils: 76 %
Platelets: 211 10*3/uL (ref 150–379)
RBC: 3.04 x10E6/uL — AB (ref 3.77–5.28)
RDW: 14.2 % (ref 12.3–15.4)
RPR Ser Ql: NONREACTIVE
WBC: 10 10*3/uL (ref 3.4–10.8)

## 2017-01-16 ENCOUNTER — Telehealth: Payer: Self-pay | Admitting: Obstetrics & Gynecology

## 2017-01-16 NOTE — Telephone Encounter (Signed)
Pt aware.

## 2017-01-16 NOTE — Telephone Encounter (Signed)
Check on patient to see if any more pain.  Unlikely need for appointment as it does not sound like she had any significant abd trauma.  Heating pad to back and Tylenol as needed.

## 2017-01-16 NOTE — Telephone Encounter (Signed)
lmtrv

## 2017-01-16 NOTE — Telephone Encounter (Signed)
Pt is calling today wanting to know if she needs to be seen due to a Fall this morning. Pt report she fell on right back side. Pt reports fetal movement and no bleeding . Please advise. cb # [id number].  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

## 2017-01-27 ENCOUNTER — Ambulatory Visit (INDEPENDENT_AMBULATORY_CARE_PROVIDER_SITE_OTHER): Payer: BC Managed Care – PPO | Admitting: Obstetrics and Gynecology

## 2017-01-27 VITALS — BP 110/62 | Wt 172.0 lb

## 2017-01-27 DIAGNOSIS — Z3493 Encounter for supervision of normal pregnancy, unspecified, third trimester: Secondary | ICD-10-CM

## 2017-01-27 DIAGNOSIS — Z23 Encounter for immunization: Secondary | ICD-10-CM | POA: Diagnosis not present

## 2017-01-27 DIAGNOSIS — Z3A30 30 weeks gestation of pregnancy: Secondary | ICD-10-CM

## 2017-01-27 DIAGNOSIS — O99013 Anemia complicating pregnancy, third trimester: Secondary | ICD-10-CM | POA: Insufficient documentation

## 2017-01-27 MED ORDER — FUSION 65-65-25-30 MG PO CAPS: 1.0000 | ORAL_CAPSULE | Freq: Every day | ORAL | 11 refills | Status: DC

## 2017-01-27 NOTE — Patient Instructions (Signed)
Third Trimester of Pregnancy The third trimester is from week 28 through week 40 (months 7 through 9). The third trimester is a time when the unborn baby (fetus) is growing rapidly. At the end of the ninth month, the fetus is about 20 inches in length and weighs 6-10 pounds. Body changes during your third trimester Your body will continue to go through many changes during pregnancy. The changes vary from woman to woman. During the third trimester:  Your weight will continue to increase. You can expect to gain 25-35 pounds (11-16 kg) by the end of the pregnancy.  You may begin to get stretch marks on your hips, abdomen, and breasts.  You may urinate more often because the fetus is moving lower into your pelvis and pressing on your bladder.  You may develop or continue to have heartburn. This is caused by increased hormones that slow down muscles in the digestive tract.  You may develop or continue to have constipation because increased hormones slow digestion and cause the muscles that push waste through your intestines to relax.  You may develop hemorrhoids. These are swollen veins (varicose veins) in the rectum that can itch or be painful.  You may develop swollen, bulging veins (varicose veins) in your legs.  You may have increased body aches in the pelvis, back, or thighs. This is due to weight gain and increased hormones that are relaxing your joints.  You may have changes in your hair. These can include thickening of your hair, rapid growth, and changes in texture. Some women also have hair loss during or after pregnancy, or hair that feels dry or thin. Your hair will most likely return to normal after your baby is born.  Your breasts will continue to grow and they will continue to become tender. A yellow fluid (colostrum) may leak from your breasts. This is the first milk you are producing for your baby.  Your belly button may stick out.  You may notice more swelling in your hands,  face, or ankles.  You may have increased tingling or numbness in your hands, arms, and legs. The skin on your belly may also feel numb.  You may feel short of breath because of your expanding uterus.  You may have more problems sleeping. This can be caused by the size of your belly, increased need to urinate, and an increase in your body's metabolism.  You may notice the fetus "dropping," or moving lower in your abdomen (lightening).  You may have increased vaginal discharge.  You may notice your joints feel loose and you may have pain around your pelvic bone.  What to expect at prenatal visits You will have prenatal exams every 2 weeks until week 36. Then you will have weekly prenatal exams. During a routine prenatal visit:  You will be weighed to make sure you and the baby are growing normally.  Your blood pressure will be taken.  Your abdomen will be measured to track your baby's growth.  The fetal heartbeat will be listened to.  Any test results from the previous visit will be discussed.  You may have a cervical check near your due date to see if your cervix has softened or thinned (effaced).  You will be tested for Group B streptococcus. This happens between 35 and 37 weeks.  Your health care provider may ask you:  What your birth plan is.  How you are feeling.  If you are feeling the baby move.  If you have had   any abnormal symptoms, such as leaking fluid, bleeding, severe headaches, or abdominal cramping.  If you are using any tobacco products, including cigarettes, chewing tobacco, and electronic cigarettes.  If you have any questions.  Other tests or screenings that may be performed during your third trimester include:  Blood tests that check for low iron levels (anemia).  Fetal testing to check the health, activity level, and growth of the fetus. Testing is done if you have certain medical conditions or if there are problems during the  pregnancy.  Nonstress test (NST). This test checks the health of your baby to make sure there are no signs of problems, such as the baby not getting enough oxygen. During this test, a belt is placed around your belly. The baby is made to move, and its heart rate is monitored during movement.  What is false labor? False labor is a condition in which you feel small, irregular tightenings of the muscles in the womb (contractions) that usually go away with rest, changing position, or drinking water. These are called Braxton Hicks contractions. Contractions may last for hours, days, or even weeks before true labor sets in. If contractions come at regular intervals, become more frequent, increase in intensity, or become painful, you should see your health care provider. What are the signs of labor?  Abdominal cramps.  Regular contractions that start at 10 minutes apart and become stronger and more frequent with time.  Contractions that start on the top of the uterus and spread down to the lower abdomen and back.  Increased pelvic pressure and dull back pain.  A watery or bloody mucus discharge that comes from the vagina.  Leaking of amniotic fluid. This is also known as your "water breaking." It could be a slow trickle or a gush. Let your health care provider know if it has a color or strange odor. If you have any of these signs, call your health care provider right away, even if it is before your due date. Follow these instructions at home: Medicines  Follow your health care provider's instructions regarding medicine use. Specific medicines may be either safe or unsafe to take during pregnancy.  Take a prenatal vitamin that contains at least 600 micrograms (mcg) of folic acid.  If you develop constipation, try taking a stool softener if your health care provider approves. Eating and drinking  Eat a balanced diet that includes fresh fruits and vegetables, whole grains, good sources of protein  such as meat, eggs, or tofu, and low-fat dairy. Your health care provider will help you determine the amount of weight gain that is right for you.  Avoid raw meat and uncooked cheese. These carry germs that can cause birth defects in the baby.  If you have low calcium intake from food, talk to your health care provider about whether you should take a daily calcium supplement.  Eat four or five small meals rather than three large meals a day.  Limit foods that are high in fat and processed sugars, such as fried and sweet foods.  To prevent constipation: ? Drink enough fluid to keep your urine clear or pale yellow. ? Eat foods that are high in fiber, such as fresh fruits and vegetables, whole grains, and beans. Activity  Exercise only as directed by your health care provider. Most women can continue their usual exercise routine during pregnancy. Try to exercise for 30 minutes at least 5 days a week. Stop exercising if you experience uterine contractions.  Avoid heavy   lifting.  Do not exercise in extreme heat or humidity, or at high altitudes.  Wear low-heel, comfortable shoes.  Practice good posture.  You may continue to have sex unless your health care provider tells you otherwise. Relieving pain and discomfort  Take frequent breaks and rest with your legs elevated if you have leg cramps or low back pain.  Take warm sitz baths to soothe any pain or discomfort caused by hemorrhoids. Use hemorrhoid cream if your health care provider approves.  Wear a good support bra to prevent discomfort from breast tenderness.  If you develop varicose veins: ? Wear support pantyhose or compression stockings as told by your healthcare provider. ? Elevate your feet for 15 minutes, 3-4 times a day. Prenatal care  Write down your questions. Take them to your prenatal visits.  Keep all your prenatal visits as told by your health care provider. This is important. Safety  Wear your seat belt at  all times when driving.  Make a list of emergency phone numbers, including numbers for family, friends, the hospital, and police and fire departments. General instructions  Avoid cat litter boxes and soil used by cats. These carry germs that can cause birth defects in the baby. If you have a cat, ask someone to clean the litter box for you.  Do not travel far distances unless it is absolutely necessary and only with the approval of your health care provider.  Do not use hot tubs, steam rooms, or saunas.  Do not drink alcohol.  Do not use any products that contain nicotine or tobacco, such as cigarettes and e-cigarettes. If you need help quitting, ask your health care provider.  Do not use any medicinal herbs or unprescribed drugs. These chemicals affect the formation and growth of the baby.  Do not douche or use tampons or scented sanitary pads.  Do not cross your legs for long periods of time.  To prepare for the arrival of your baby: ? Take prenatal classes to understand, practice, and ask questions about labor and delivery. ? Make a trial run to the hospital. ? Visit the hospital and tour the maternity area. ? Arrange for maternity or paternity leave through employers. ? Arrange for family and friends to take care of pets while you are in the hospital. ? Purchase a rear-facing car seat and make sure you know how to install it in your car. ? Pack your hospital bag. ? Prepare the baby's nursery. Make sure to remove all pillows and stuffed animals from the baby's crib to prevent suffocation.  Visit your dentist if you have not gone during your pregnancy. Use a soft toothbrush to brush your teeth and be gentle when you floss. Contact a health care provider if:  You are unsure if you are in labor or if your water has broken.  You become dizzy.  You have mild pelvic cramps, pelvic pressure, or nagging pain in your abdominal area.  You have lower back pain.  You have persistent  nausea, vomiting, or diarrhea.  You have an unusual or bad smelling vaginal discharge.  You have pain when you urinate. Get help right away if:  Your water breaks before 37 weeks.  You have regular contractions less than 5 minutes apart before 37 weeks.  You have a fever.  You are leaking fluid from your vagina.  You have spotting or bleeding from your vagina.  You have severe abdominal pain or cramping.  You have rapid weight loss or weight gain.    You have shortness of breath with chest pain.  You notice sudden or extreme swelling of your face, hands, ankles, feet, or legs.  Your baby makes fewer than 10 movements in 2 hours.  You have severe headaches that do not go away when you take medicine.  You have vision changes. Summary  The third trimester is from week 28 through week 40, months 7 through 9. The third trimester is a time when the unborn baby (fetus) is growing rapidly.  During the third trimester, your discomfort may increase as you and your baby continue to gain weight. You may have abdominal, leg, and back pain, sleeping problems, and an increased need to urinate.  During the third trimester your breasts will keep growing and they will continue to become tender. A yellow fluid (colostrum) may leak from your breasts. This is the first milk you are producing for your baby.  False labor is a condition in which you feel small, irregular tightenings of the muscles in the womb (contractions) that eventually go away. These are called Braxton Hicks contractions. Contractions may last for hours, days, or even weeks before true labor sets in.  Signs of labor can include: abdominal cramps; regular contractions that start at 10 minutes apart and become stronger and more frequent with time; watery or bloody mucus discharge that comes from the vagina; increased pelvic pressure and dull back pain; and leaking of amniotic fluid. This information is not intended to replace advice  given to you by your health care provider. Make sure you discuss any questions you have with your health care provider. Document Released: 10/07/2001 Document Revised: 03/20/2016 Document Reviewed: 12/14/2012 Elsevier Interactive Patient Education  2017 Elsevier Inc.  

## 2017-01-27 NOTE — Progress Notes (Signed)
    Routine Prenatal Care Visit  Subjective  Fetal Movement? yes Contractions? no Leaking Fluid? no Vaginal Bleeding? no  Objective   Vitals:   01/27/17 1543  BP: 110/62    @WEIGHTCHANGE @ Urine dipstick shows negative for all components.  General: NAD Pumonary: no increased work of breathing Abdomen: gravid, non-tender, fundal height 29, fetal heart tones 135BPM Extremities: no edema Psychiatric: mood appropriate, affect full   Assessment   26 y.o. G1P0 at [redacted]w[redacted]d by  04/05/2017, by Last Menstrual Period presenting for routine prenatal visit  first pregnancy Problems (from 01/08/17 to present)    No problems associated with this episode.       Plan   Problem List Items Addressed This Visit      Other   Supervision of low-risk pregnancy, third trimester   Anemia complicating pregnancy in third trimester   Relevant Medications   Fe Fum-Fe Poly-Vit C-Lactobac (FUSION) 65-65-25-30 MG CAPS    Other Visit Diagnoses    [redacted] weeks gestation of pregnancy    -  Primary     - start iron supplementation - TDAP today

## 2017-01-27 NOTE — Progress Notes (Signed)
TDAP/blood transfusion form

## 2017-01-27 NOTE — Addendum Note (Signed)
Addended by: [place], Chizuko Trine B on: 01/27/2017 04:35 PM   Modules accepted: Orders

## 2017-01-28 ENCOUNTER — Encounter: Payer: Self-pay | Admitting: Obstetrics and Gynecology

## 2017-01-29 ENCOUNTER — Encounter: Payer: Self-pay | Admitting: Obstetrics and Gynecology

## 2017-02-05 ENCOUNTER — Encounter: Payer: BC Managed Care – PPO | Admitting: Obstetrics and Gynecology

## 2017-02-11 ENCOUNTER — Encounter: Payer: BC Managed Care – PPO | Admitting: Obstetrics and Gynecology

## 2017-02-13 ENCOUNTER — Ambulatory Visit (INDEPENDENT_AMBULATORY_CARE_PROVIDER_SITE_OTHER): Payer: BC Managed Care – PPO | Admitting: Advanced Practice Midwife

## 2017-02-13 VITALS — BP 120/60 | Wt 177.0 lb

## 2017-02-13 DIAGNOSIS — Z3A32 32 weeks gestation of pregnancy: Secondary | ICD-10-CM

## 2017-02-13 NOTE — Progress Notes (Signed)
c/o foot swelling and heartburn (taking OTC prilosec)- comfort measures discussed. Question about breast pump- will order Medela.

## 2017-02-13 NOTE — Patient Instructions (Signed)
Third Trimester of Pregnancy The third trimester is from week 28 through week 40 (months 7 through 9). The third trimester is a time when the unborn baby (fetus) is growing rapidly. At the end of the ninth month, the fetus is about 20 inches in length and weighs 6-10 pounds. Body changes during your third trimester Your body will continue to go through many changes during pregnancy. The changes vary from woman to woman. During the third trimester:  Your weight will continue to increase. You can expect to gain 25-35 pounds (11-16 kg) by the end of the pregnancy.  You may begin to get stretch marks on your hips, abdomen, and breasts.  You may urinate more often because the fetus is moving lower into your pelvis and pressing on your bladder.  You may develop or continue to have heartburn. This is caused by increased hormones that slow down muscles in the digestive tract.  You may develop or continue to have constipation because increased hormones slow digestion and cause the muscles that push waste through your intestines to relax.  You may develop hemorrhoids. These are swollen veins (varicose veins) in the rectum that can itch or be painful.  You may develop swollen, bulging veins (varicose veins) in your legs.  You may have increased body aches in the pelvis, back, or thighs. This is due to weight gain and increased hormones that are relaxing your joints.  You may have changes in your hair. These can include thickening of your hair, rapid growth, and changes in texture. Some women also have hair loss during or after pregnancy, or hair that feels dry or thin. Your hair will most likely return to normal after your baby is born.  Your breasts will continue to grow and they will continue to become tender. A yellow fluid (colostrum) may leak from your breasts. This is the first milk you are producing for your baby.  Your belly button may stick out.  You may notice more swelling in your hands,  face, or ankles.  You may have increased tingling or numbness in your hands, arms, and legs. The skin on your belly may also feel numb.  You may feel short of breath because of your expanding uterus.  You may have more problems sleeping. This can be caused by the size of your belly, increased need to urinate, and an increase in your body's metabolism.  You may notice the fetus "dropping," or moving lower in your abdomen (lightening).  You may have increased vaginal discharge.  You may notice your joints feel loose and you may have pain around your pelvic bone.  What to expect at prenatal visits You will have prenatal exams every 2 weeks until week 36. Then you will have weekly prenatal exams. During a routine prenatal visit:  You will be weighed to make sure you and the baby are growing normally.  Your blood pressure will be taken.  Your abdomen will be measured to track your baby's growth.  The fetal heartbeat will be listened to.  Any test results from the previous visit will be discussed.  You may have a cervical check near your due date to see if your cervix has softened or thinned (effaced).  You will be tested for Group B streptococcus. This happens between 35 and 37 weeks.  Your health care provider may ask you:  What your birth plan is.  How you are feeling.  If you are feeling the baby move.  If you have had   any abnormal symptoms, such as leaking fluid, bleeding, severe headaches, or abdominal cramping.  If you are using any tobacco products, including cigarettes, chewing tobacco, and electronic cigarettes.  If you have any questions.  Other tests or screenings that may be performed during your third trimester include:  Blood tests that check for low iron levels (anemia).  Fetal testing to check the health, activity level, and growth of the fetus. Testing is done if you have certain medical conditions or if there are problems during the  pregnancy.  Nonstress test (NST). This test checks the health of your baby to make sure there are no signs of problems, such as the baby not getting enough oxygen. During this test, a belt is placed around your belly. The baby is made to move, and its heart rate is monitored during movement.  What is false labor? False labor is a condition in which you feel small, irregular tightenings of the muscles in the womb (contractions) that usually go away with rest, changing position, or drinking water. These are called Braxton Hicks contractions. Contractions may last for hours, days, or even weeks before true labor sets in. If contractions come at regular intervals, become more frequent, increase in intensity, or become painful, you should see your health care provider. What are the signs of labor?  Abdominal cramps.  Regular contractions that start at 10 minutes apart and become stronger and more frequent with time.  Contractions that start on the top of the uterus and spread down to the lower abdomen and back.  Increased pelvic pressure and dull back pain.  A watery or bloody mucus discharge that comes from the vagina.  Leaking of amniotic fluid. This is also known as your "water breaking." It could be a slow trickle or a gush. Let your health care provider know if it has a color or strange odor. If you have any of these signs, call your health care provider right away, even if it is before your due date. Follow these instructions at home: Medicines  Follow your health care provider's instructions regarding medicine use. Specific medicines may be either safe or unsafe to take during pregnancy.  Take a prenatal vitamin that contains at least 600 micrograms (mcg) of folic acid.  If you develop constipation, try taking a stool softener if your health care provider approves. Eating and drinking  Eat a balanced diet that includes fresh fruits and vegetables, whole grains, good sources of protein  such as meat, eggs, or tofu, and low-fat dairy. Your health care provider will help you determine the amount of weight gain that is right for you.  Avoid raw meat and uncooked cheese. These carry germs that can cause birth defects in the baby.  If you have low calcium intake from food, talk to your health care provider about whether you should take a daily calcium supplement.  Eat four or five small meals rather than three large meals a day.  Limit foods that are high in fat and processed sugars, such as fried and sweet foods.  To prevent constipation: ? Drink enough fluid to keep your urine clear or pale yellow. ? Eat foods that are high in fiber, such as fresh fruits and vegetables, whole grains, and beans. Activity  Exercise only as directed by your health care provider. Most women can continue their usual exercise routine during pregnancy. Try to exercise for 30 minutes at least 5 days a week. Stop exercising if you experience uterine contractions.  Avoid heavy   lifting.  Do not exercise in extreme heat or humidity, or at high altitudes.  Wear low-heel, comfortable shoes.  Practice good posture.  You may continue to have sex unless your health care provider tells you otherwise. Relieving pain and discomfort  Take frequent breaks and rest with your legs elevated if you have leg cramps or low back pain.  Take warm sitz baths to soothe any pain or discomfort caused by hemorrhoids. Use hemorrhoid cream if your health care provider approves.  Wear a good support bra to prevent discomfort from breast tenderness.  If you develop varicose veins: ? Wear support pantyhose or compression stockings as told by your healthcare provider. ? Elevate your feet for 15 minutes, 3-4 times a day. Prenatal care  Write down your questions. Take them to your prenatal visits.  Keep all your prenatal visits as told by your health care provider. This is important. Safety  Wear your seat belt at  all times when driving.  Make a list of emergency phone numbers, including numbers for family, friends, the hospital, and police and fire departments. General instructions  Avoid cat litter boxes and soil used by cats. These carry germs that can cause birth defects in the baby. If you have a cat, ask someone to clean the litter box for you.  Do not travel far distances unless it is absolutely necessary and only with the approval of your health care provider.  Do not use hot tubs, steam rooms, or saunas.  Do not drink alcohol.  Do not use any products that contain nicotine or tobacco, such as cigarettes and e-cigarettes. If you need help quitting, ask your health care provider.  Do not use any medicinal herbs or unprescribed drugs. These chemicals affect the formation and growth of the baby.  Do not douche or use tampons or scented sanitary pads.  Do not cross your legs for long periods of time.  To prepare for the arrival of your baby: ? Take prenatal classes to understand, practice, and ask questions about labor and delivery. ? Make a trial run to the hospital. ? Visit the hospital and tour the maternity area. ? Arrange for maternity or paternity leave through employers. ? Arrange for family and friends to take care of pets while you are in the hospital. ? Purchase a rear-facing car seat and make sure you know how to install it in your car. ? Pack your hospital bag. ? Prepare the baby's nursery. Make sure to remove all pillows and stuffed animals from the baby's crib to prevent suffocation.  Visit your dentist if you have not gone during your pregnancy. Use a soft toothbrush to brush your teeth and be gentle when you floss. Contact a health care provider if:  You are unsure if you are in labor or if your water has broken.  You become dizzy.  You have mild pelvic cramps, pelvic pressure, or nagging pain in your abdominal area.  You have lower back pain.  You have persistent  nausea, vomiting, or diarrhea.  You have an unusual or bad smelling vaginal discharge.  You have pain when you urinate. Get help right away if:  Your water breaks before 37 weeks.  You have regular contractions less than 5 minutes apart before 37 weeks.  You have a fever.  You are leaking fluid from your vagina.  You have spotting or bleeding from your vagina.  You have severe abdominal pain or cramping.  You have rapid weight loss or weight gain.    You have shortness of breath with chest pain.  You notice sudden or extreme swelling of your face, hands, ankles, feet, or legs.  Your baby makes fewer than 10 movements in 2 hours.  You have severe headaches that do not go away when you take medicine.  You have vision changes. Summary  The third trimester is from week 28 through week 40, months 7 through 9. The third trimester is a time when the unborn baby (fetus) is growing rapidly.  During the third trimester, your discomfort may increase as you and your baby continue to gain weight. You may have abdominal, leg, and back pain, sleeping problems, and an increased need to urinate.  During the third trimester your breasts will keep growing and they will continue to become tender. A yellow fluid (colostrum) may leak from your breasts. This is the first milk you are producing for your baby.  False labor is a condition in which you feel small, irregular tightenings of the muscles in the womb (contractions) that eventually go away. These are called Braxton Hicks contractions. Contractions may last for hours, days, or even weeks before true labor sets in.  Signs of labor can include: abdominal cramps; regular contractions that start at 10 minutes apart and become stronger and more frequent with time; watery or bloody mucus discharge that comes from the vagina; increased pelvic pressure and dull back pain; and leaking of amniotic fluid. This information is not intended to replace advice  given to you by your health care provider. Make sure you discuss any questions you have with your health care provider. Document Released: 10/07/2001 Document Revised: 03/20/2016 Document Reviewed: 12/14/2012 Elsevier Interactive Patient Education  2017 Elsevier Inc.  

## 2017-02-13 NOTE — Progress Notes (Signed)
Feet swelling 

## 2017-02-27 ENCOUNTER — Ambulatory Visit (INDEPENDENT_AMBULATORY_CARE_PROVIDER_SITE_OTHER): Payer: BC Managed Care – PPO | Admitting: Obstetrics and Gynecology

## 2017-02-27 ENCOUNTER — Encounter: Payer: BC Managed Care – PPO | Admitting: Advanced Practice Midwife

## 2017-02-27 VITALS — BP 118/62 | Wt 182.0 lb

## 2017-02-27 DIAGNOSIS — Z3493 Encounter for supervision of normal pregnancy, unspecified, third trimester: Secondary | ICD-10-CM

## 2017-02-27 DIAGNOSIS — Z3A34 34 weeks gestation of pregnancy: Secondary | ICD-10-CM

## 2017-02-27 NOTE — Progress Notes (Signed)
Doing well. Pos PNVs. No VB, LOF. Feet swelling, improve with elevation. Increase water/decrease sodium. Reassurance--normal BP and urine. RTO 2 wks. GBS nv.

## 2017-02-27 NOTE — Progress Notes (Signed)
Feet swelling 

## 2017-03-05 ENCOUNTER — Encounter: Payer: Self-pay | Admitting: Obstetrics and Gynecology

## 2017-03-09 ENCOUNTER — Ambulatory Visit (INDEPENDENT_AMBULATORY_CARE_PROVIDER_SITE_OTHER): Payer: BC Managed Care – PPO | Admitting: Obstetrics and Gynecology

## 2017-03-09 VITALS — BP 118/74 | Wt 180.0 lb

## 2017-03-09 DIAGNOSIS — O99013 Anemia complicating pregnancy, third trimester: Secondary | ICD-10-CM

## 2017-03-09 DIAGNOSIS — Z3493 Encounter for supervision of normal pregnancy, unspecified, third trimester: Secondary | ICD-10-CM

## 2017-03-09 DIAGNOSIS — Z3A36 36 weeks gestation of pregnancy: Secondary | ICD-10-CM

## 2017-03-09 NOTE — Progress Notes (Signed)
Pt c/o a rash on stomach that has not gotten better. Has used oil/lotion on it with no relief. PEP. No itching on soles and palms.  Conservative measures discussed. May use topical benadryl, OTC cortisone bid.  May try oral 1st and 2nd gen antihistamines, prn.  May need rx topical corticosteroid. No vb . No lof.

## 2017-03-09 NOTE — Patient Instructions (Signed)
-   you may start with a topical antihistamine, such as benadryl. - take benadryl at bedtime (up to 50 mg, or 2 tablets) - may take zyrtec or claritin during the day time - you may use hydrocortisone 1% twice daily. - if no improvement in symptoms, a stronger topical steroid may be prescribed.

## 2017-03-11 LAB — STREP GP B NAA: Strep Gp B NAA: NEGATIVE

## 2017-03-18 ENCOUNTER — Ambulatory Visit (INDEPENDENT_AMBULATORY_CARE_PROVIDER_SITE_OTHER): Payer: BC Managed Care – PPO | Admitting: Obstetrics and Gynecology

## 2017-03-18 VITALS — BP 124/76 | Wt 182.0 lb

## 2017-03-18 DIAGNOSIS — O2686 Pruritic urticarial papules and plaques of pregnancy (PUPPP): Secondary | ICD-10-CM

## 2017-03-18 DIAGNOSIS — O99013 Anemia complicating pregnancy, third trimester: Secondary | ICD-10-CM

## 2017-03-18 DIAGNOSIS — Z3A37 37 weeks gestation of pregnancy: Secondary | ICD-10-CM

## 2017-03-18 DIAGNOSIS — Z3493 Encounter for supervision of normal pregnancy, unspecified, third trimester: Secondary | ICD-10-CM

## 2017-03-18 MED ORDER — TRIAMCINOLONE ACETONIDE 0.5 % EX CREA: TOPICAL_CREAM | Freq: Two times a day (BID) | CUTANEOUS | Status: DC

## 2017-03-18 NOTE — Progress Notes (Signed)
No vb. No lof. Worsening PEP, despite oral and topical antihistamines. Will start Kenalog 0.5% bid

## 2017-03-25 ENCOUNTER — Ambulatory Visit (INDEPENDENT_AMBULATORY_CARE_PROVIDER_SITE_OTHER): Payer: BC Managed Care – PPO | Admitting: Obstetrics and Gynecology

## 2017-03-25 VITALS — BP 124/78 | Wt 183.0 lb

## 2017-03-25 DIAGNOSIS — O99013 Anemia complicating pregnancy, third trimester: Secondary | ICD-10-CM

## 2017-03-25 DIAGNOSIS — O2686 Pruritic urticarial papules and plaques of pregnancy (PUPPP): Secondary | ICD-10-CM

## 2017-03-25 DIAGNOSIS — Z3A38 38 weeks gestation of pregnancy: Secondary | ICD-10-CM

## 2017-03-25 DIAGNOSIS — Z3493 Encounter for supervision of normal pregnancy, unspecified, third trimester: Secondary | ICD-10-CM

## 2017-03-25 NOTE — Progress Notes (Signed)
No vb. No lof.  

## 2017-04-01 ENCOUNTER — Ambulatory Visit (INDEPENDENT_AMBULATORY_CARE_PROVIDER_SITE_OTHER): Payer: BC Managed Care – PPO | Admitting: Obstetrics and Gynecology

## 2017-04-01 VITALS — BP 122/68 | Wt 184.0 lb

## 2017-04-01 DIAGNOSIS — Z3A39 39 weeks gestation of pregnancy: Secondary | ICD-10-CM

## 2017-04-01 DIAGNOSIS — Z3493 Encounter for supervision of normal pregnancy, unspecified, third trimester: Secondary | ICD-10-CM

## 2017-04-06 ENCOUNTER — Ambulatory Visit (INDEPENDENT_AMBULATORY_CARE_PROVIDER_SITE_OTHER): Payer: BC Managed Care – PPO | Admitting: Obstetrics & Gynecology

## 2017-04-06 VITALS — BP 136/78 | Wt 183.0 lb

## 2017-04-06 DIAGNOSIS — Z3493 Encounter for supervision of normal pregnancy, unspecified, third trimester: Secondary | ICD-10-CM

## 2017-04-06 DIAGNOSIS — Z3A40 40 weeks gestation of pregnancy: Secondary | ICD-10-CM

## 2017-04-06 NOTE — Progress Notes (Addendum)
No pain or bleeding.  Good FM. Plan NST and AFI Thurs as cervix still not favorable. Plan IOL this weekend (41 weeks).  Scheduled today for 6/16 at 0500. [facility], Labor precautions.

## 2017-04-06 NOTE — Addendum Note (Signed)
Addended by: Gae Dry on: 04/06/2017 10:44 AM   Modules accepted: Orders, SmartSet

## 2017-04-07 NOTE — Addendum Note (Signed)
Addended by: Gae Dry on: 04/07/2017 07:44 AM   Modules accepted: Orders

## 2017-04-09 ENCOUNTER — Ambulatory Visit (INDEPENDENT_AMBULATORY_CARE_PROVIDER_SITE_OTHER): Payer: BC Managed Care – PPO

## 2017-04-09 ENCOUNTER — Ambulatory Visit (INDEPENDENT_AMBULATORY_CARE_PROVIDER_SITE_OTHER): Payer: BC Managed Care – PPO | Admitting: Obstetrics and Gynecology

## 2017-04-09 VITALS — BP 138/82 | Wt 184.0 lb

## 2017-04-09 DIAGNOSIS — O99013 Anemia complicating pregnancy, third trimester: Secondary | ICD-10-CM

## 2017-04-09 DIAGNOSIS — Z362 Encounter for other antenatal screening follow-up: Secondary | ICD-10-CM | POA: Diagnosis not present

## 2017-04-09 DIAGNOSIS — O2686 Pruritic urticarial papules and plaques of pregnancy (PUPPP): Secondary | ICD-10-CM

## 2017-04-09 DIAGNOSIS — Z3A40 40 weeks gestation of pregnancy: Secondary | ICD-10-CM

## 2017-04-09 DIAGNOSIS — Z3493 Encounter for supervision of normal pregnancy, unspecified, third trimester: Secondary | ICD-10-CM | POA: Diagnosis not present

## 2017-04-09 LAB — FETAL NONSTRESS TEST

## 2017-04-09 NOTE — Progress Notes (Signed)
NST today reactive. No vb. No lof AFI 23 cm.   Baseline FHR: 135 beats/min Variability: moderate Accelerations: present Decelerations: absent Tocometry: not measured  Interpretation:  INDICATIONS: post-dates pregnancy RESULTS:  A NST procedure was performed with FHR monitoring and a normal baseline established, appropriate time of 20-40 minutes of evaluation, and accels >2 seen w 15x15 characteristics.  Results show a REACTIVE NST.

## 2017-04-11 ENCOUNTER — Ambulatory Visit: Payer: BC Managed Care – PPO

## 2017-04-11 ENCOUNTER — Inpatient Hospital Stay: Payer: BC Managed Care – PPO | Admitting: Anesthesiology

## 2017-04-11 ENCOUNTER — Inpatient Hospital Stay
Admission: EM | Admit: 2017-04-11 | Discharge: 2017-04-15 | DRG: 765 | Disposition: A | Discharge status: Home / Self Care | Payer: BC Managed Care – PPO | Attending: Obstetrics & Gynecology | Admitting: Obstetrics & Gynecology

## 2017-04-11 DIAGNOSIS — D62 Acute posthemorrhagic anemia: Secondary | ICD-10-CM | POA: Diagnosis not present

## 2017-04-11 DIAGNOSIS — O480 Post-term pregnancy: Secondary | ICD-10-CM | POA: Diagnosis present

## 2017-04-11 DIAGNOSIS — O2686 Pruritic urticarial papules and plaques of pregnancy (PUPPP): Secondary | ICD-10-CM

## 2017-04-11 DIAGNOSIS — O339 Maternal care for disproportion, unspecified: Secondary | ICD-10-CM | POA: Diagnosis present

## 2017-04-11 DIAGNOSIS — Z8249 Family history of ischemic heart disease and other diseases of the circulatory system: Secondary | ICD-10-CM

## 2017-04-11 DIAGNOSIS — Z3A40 40 weeks gestation of pregnancy: Secondary | ICD-10-CM

## 2017-04-11 DIAGNOSIS — Z98891 History of uterine scar from previous surgery: Secondary | ICD-10-CM

## 2017-04-11 DIAGNOSIS — D649 Anemia, unspecified: Secondary | ICD-10-CM | POA: Diagnosis not present

## 2017-04-11 DIAGNOSIS — O9081 Anemia of the puerperium: Secondary | ICD-10-CM | POA: Diagnosis not present

## 2017-04-11 LAB — CBC
HCT: 32.3 % — ABNORMAL LOW (ref 35.0–47.0)
HEMOGLOBIN: 11.2 g/dL — AB (ref 12.0–16.0)
MCH: 32.7 pg (ref 26.0–34.0)
MCHC: 34.6 g/dL (ref 32.0–36.0)
MCV: 94.7 fL (ref 80.0–100.0)
PLATELETS: 100 10*3/uL — AB (ref 150–440)
RBC: 3.41 MIL/uL — AB (ref 3.80–5.20)
RDW: 14.1 % (ref 11.5–14.5)
WBC: 9.5 10*3/uL (ref 3.6–11.0)

## 2017-04-11 LAB — PLATELET COUNT
Platelets: 100 10*3/uL — ABNORMAL LOW (ref 150–440)
Platelets: 103 10*3/uL — ABNORMAL LOW (ref 150–440)

## 2017-04-11 LAB — TYPE AND SCREEN
ABO/RH(D): O POS
ANTIBODY SCREEN: NEGATIVE

## 2017-04-11 MED ORDER — FENTANYL 2.5 MCG/ML W/ROPIVACAINE 0.15% IN NS 100 ML EPIDURAL (ARMC)
EPIDURAL | Status: DC | PRN
  Administered 2017-04-11: 12 mL/h via EPIDURAL

## 2017-04-11 MED ORDER — AMMONIA AROMATIC IN INHA
RESPIRATORY_TRACT | Status: AC
  Filled 2017-04-11: qty 10

## 2017-04-11 MED ORDER — LIDOCAINE-EPINEPHRINE (PF) 1.5 %-1:200000 IJ SOLN
INTRAMUSCULAR | Status: DC | PRN
  Administered 2017-04-11: 3 mL via EPIDURAL

## 2017-04-11 MED ORDER — MISOPROSTOL 200 MCG PO TABS
ORAL_TABLET | ORAL | Status: AC
  Filled 2017-04-11: qty 4

## 2017-04-11 MED ORDER — OXYTOCIN 40 UNITS IN LACTATED RINGERS INFUSION - SIMPLE MED
INTRAVENOUS | Status: AC
  Administered 2017-04-11: 1 m[IU]/min via INTRAVENOUS
  Filled 2017-04-11: qty 1000

## 2017-04-11 MED ORDER — OXYTOCIN 10 UNIT/ML IJ SOLN
INTRAMUSCULAR | Status: AC
  Filled 2017-04-11: qty 2

## 2017-04-11 MED ORDER — OXYTOCIN BOLUS FROM INFUSION: 500.0000 mL | Freq: Once | INTRAVENOUS | Status: DC

## 2017-04-11 MED ORDER — OXYTOCIN 40 UNITS IN LACTATED RINGERS INFUSION - SIMPLE MED
1.0000 m[IU]/min | INTRAVENOUS | Status: DC
  Administered 2017-04-11: 1 m[IU]/min via INTRAVENOUS

## 2017-04-11 MED ORDER — LACTATED RINGERS IV SOLN
INTRAVENOUS | Status: DC
  Administered 2017-04-11: 06:00:00 via INTRAVENOUS

## 2017-04-11 MED ORDER — BUTORPHANOL TARTRATE 2 MG/ML IJ SOLN
1.0000 mg | INTRAMUSCULAR | Status: DC | PRN
  Administered 2017-04-11 (×3): 1 mg via INTRAVENOUS
  Filled 2017-04-11 (×3): qty 2

## 2017-04-11 MED ORDER — LACTATED RINGERS IV SOLN: 500.0000 mL | INTRAVENOUS | Status: DC | PRN

## 2017-04-11 MED ORDER — FENTANYL 2.5 MCG/ML W/ROPIVACAINE 0.2% IN NS 100 ML EPIDURAL INFUSION (ARMC-ANES)
EPIDURAL | Status: AC
  Filled 2017-04-11: qty 100

## 2017-04-11 MED ORDER — TERBUTALINE SULFATE 1 MG/ML IJ SOLN: 0.2500 mg | Freq: Once | INTRAMUSCULAR | Status: DC | PRN

## 2017-04-11 MED ORDER — LIDOCAINE HCL (PF) 1 % IJ SOLN
INTRAMUSCULAR | Status: AC
  Filled 2017-04-11: qty 30

## 2017-04-11 MED ORDER — ONDANSETRON HCL 4 MG/2ML IJ SOLN: 4.0000 mg | Freq: Four times a day (QID) | INTRAMUSCULAR | Status: DC | PRN

## 2017-04-11 MED ORDER — ACETAMINOPHEN 325 MG PO TABS: 650.0000 mg | ORAL_TABLET | ORAL | Status: DC | PRN

## 2017-04-11 MED ORDER — MISOPROSTOL 25 MCG QUARTER TABLET
25.0000 ug | ORAL_TABLET | ORAL | Status: DC | PRN
  Administered 2017-04-11: 25 ug via VAGINAL
  Filled 2017-04-11 (×2): qty 1

## 2017-04-11 MED ORDER — TERBUTALINE SULFATE 1 MG/ML IJ SOLN
0.2500 mg | Freq: Once | INTRAMUSCULAR | Status: DC | PRN
Start: 2017-04-11 — End: 2017-04-12

## 2017-04-11 MED ORDER — LIDOCAINE HCL (PF) 1 % IJ SOLN
INTRAMUSCULAR | Status: DC | PRN
  Administered 2017-04-11: 1 mL via SUBCUTANEOUS

## 2017-04-11 MED ORDER — OXYTOCIN 40 UNITS IN LACTATED RINGERS INFUSION - SIMPLE MED
2.5000 [IU]/h | INTRAVENOUS | Status: DC
  Administered 2017-04-12: 500 mL via INTRAVENOUS
  Filled 2017-04-11: qty 1000

## 2017-04-11 NOTE — Anesthesia Procedure Notes (Signed)
Epidural Patient location during procedure: OB Start time: 04/11/2017 7:48 PM End time: 04/11/2017 8:14 PM  Staffing Performed: anesthesiologist   Preanesthetic Checklist Completed: patient identified, site marked, surgical consent, pre-op evaluation, timeout performed, IV checked, risks and benefits discussed and monitors and equipment checked  Epidural Patient position: sitting Prep: Betadine Patient monitoring: heart rate, continuous pulse ox and blood pressure Approach: midline Location: L4-L5 Injection technique: LOR saline  Needle:  Needle type: Tuohy  Needle gauge: 17 G Needle length: 9 cm and 9 Needle insertion depth: 8 cm Catheter type: closed end flexible Catheter size: 20 Guage Catheter at skin depth: 10 cm Test dose: negative and 1.5% lidocaine with Epi 1:200 K  Assessment Events: blood not aspirated, injection not painful, no injection resistance, negative IV test and no paresthesia  Additional Notes   Patient tolerated the insertion well without complications.Reason for block:procedure for pain

## 2017-04-11 NOTE — Progress Notes (Signed)
  Labor Progress Note   26 y.o. G1P0 @ [redacted]w[redacted]d , admitted for  Pregnancy, Labor Management. IOL POST DATES  Subjective:  Pain significant. Anesthesia refuses to do epidural due to plt count 100,000.  Objective:  BP 132/87   Pulse 87   Temp 97.9 F (36.6 C) (Oral)   Resp 16   Ht 5\' 1"  (1.549 m)   Wt 184 lb (83.5 kg)   LMP 06/29/2016   SpO2 98%   BMI 34.77 kg/m  Abd: moderate Extr: trace to 1+ bilateral pedal edema SVE: CERVIX: 4-5 cm dilated, 80 effaced, -2 station  EFM: FHR: 140 bpm, variability: moderate,  accelerations:  Present,  decelerations:  Absent Toco: Frequency: Every 2-4 minutes Labs:  Current lab results:  Recent Labs  04/11/17 0524  WBC 9.5  HGB 11.2*  HCT 32.3*  PLT      100  Assessment & Plan:  G1P0 @ [redacted]w[redacted]d, admitted for  Pregnancy and Labor/Delivery Management  1. Pain management: IV sedation. 2. FWB: FHT category 1.  3. ID: GBS negative 4. Labor management: Cont Pitocin and active mgt of labor  All discussed with patient, see orders  Barnett Applebaum, MD, [facility], [facility] 04/11/2017  4:33 PM

## 2017-04-11 NOTE — H&P (Signed)
History and Physical  Heather Conner is a 26 y.o. G1P0 [redacted]w[redacted]d  for Induction of Labor scheduled due to Postdates .   See labor record for pregnancy highlights.  No recent pain, bleeding, ruptured membranes, or other signs of progressing labor.  PMHx: She  has a past medical history of Anemia; Eczema; Endometriosis; History of ovarian cyst; and Migraine. Also,  has a past surgical history that includes Laparoscopic ovarian cystectomy (Right, 2012) and Ablation on endometriosis (2012)., family history includes Birth defects in her mother; Cancer in her father; Diabetes in her paternal grandfather; Endometriosis in her mother; Hypertension in her mother; Stroke in her paternal grandfather.,  reports that she has never smoked. She has never used smokeless tobacco. She reports that she does not drink alcohol or use drugs. Meds- PNV. Also, has No Known Allergies. OB History  Gravida Para Term Preterm AB Living  1            SAB TAB Ectopic Multiple Live Births               # Outcome Date GA Lbr Len/2nd Weight Sex Delivery Anes PTL Lv  1 Current             Patient denies any other pertinent gynecologic issues.   Review of Systems  Constitutional: Negative for chills, fever and malaise/fatigue.  HENT: Negative for congestion, sinus pain and sore throat.   Eyes: Negative for blurred vision and pain.  Respiratory: Negative for cough and wheezing.   Cardiovascular: Negative for chest pain and leg swelling.  Gastrointestinal: Negative for abdominal pain, constipation, diarrhea, heartburn, nausea and vomiting.  Genitourinary: Negative for dysuria, frequency, hematuria and urgency.  Musculoskeletal: Negative for back pain, joint pain, myalgias and neck pain.  Skin: Negative for itching and rash.  Neurological: Negative for dizziness, tremors and weakness.  Endo/Heme/Allergies: Does not bruise/bleed easily.  Psychiatric/Behavioral: Negative for depression. The patient is not nervous/anxious and does  not have insomnia.     Objective: BP 108/63   Pulse 92   Temp 97.7 F (36.5 C) (Oral)   Resp 16   Ht 5\' 1"  (1.549 m)   Wt 184 lb (83.5 kg)   LMP 06/29/2016   BMI 34.77 kg/m   Physical Exam  Constitutional: She appears well-developed and well-nourished. No distress.  HENT:  Head: Normocephalic and atraumatic.   Constitutional NAD, Conversant  Skin No rashes, lesions or ulceration. Normal palpated skin turgor. No nodularity.  Lungs: Clear to auscultation.No rales or wheezes. Normal Respiratory effort, no retractions.  Heart: NSR.  No murmurs or rubs appreciated. No periferal edema  Abdomen: Gravid.  Non-tender.  No masses.  No HSM. No hernia  Extremities: Moves all appropriately.  Normal ROM for age. No lymphadenopathy.  Neuro: Grossly intact  Psych: Oriented to PPT.  Normal mood. Normal affect.     Pelvic:   Vulva: Normal appearance.  No lesions.  Vagina: No lesions or abnormalities noted.  Urethra No masses tenderness or scarring.  Meatus Normal size without lesions or prolapse.  Cervix: FT/50/-3.   Perineum: Normal exam.  No lesions.        Bimanual   Uterus: Enlarged.  Non-tender.    Adnexae: Not palpated.  Cul-de-sac: Negative for abnormality.  GBS NEG TDaP UTD O+, RI, VI  Assessment: Term Pregnancy for Induction of Labor due to Postdates.  Plan: Patient will undergo induction of labor with cervical ripening agents.     Patient has been fully informed of the pros  and cons, risks and benefits of continued observation with fetal monitoring versus that of induction of labor.   She understands that there are uncommon risks to induction, which include but are not limited to : frequent or prolonged uterine contractions, fetal distress, uterine rupture, and lack of successful induction.  These risks include all methods including Pitocin and Misoprostol and Cervadil.  Patient understands that using Misoprostol for labor induction is an "off label" indication although it has  been studied extensively for this purpose and is an accepted method of induction.  She also has been informed of the increased risks for Cesarean with induction and should induction not be successful.  Patient consents to the induction plan of management.  Plans to breast feed TDaP UTD  Barnett Applebaum, MD, [facility], [facility] 04/11/2017  9:41 AM

## 2017-04-11 NOTE — Progress Notes (Signed)
  Labor Progress Note   26 y.o. G1P0 @ [redacted]w[redacted]d , admitted for  Pregnancy, Labor Management. IOL POST DATES  Subjective:  Pain significant. Anesthesia to do epidural now.  Objective:  BP 129/90   Pulse 91   Temp 97.9 F (36.6 C) (Oral)   Resp 16   Ht 5\' 1"  (1.549 m)   Wt 184 lb (83.5 kg)   LMP 06/29/2016   SpO2 97%   BMI 34.77 kg/m  Abd: moderate Extr: trace to 1+ bilateral pedal edema SVE: CERVIX: 4-5 cm dilated, 80 effaced, -2 station (no change)  EFM: FHR: 140 bpm, variability: moderate,  accelerations:  Present,  decelerations:  Absent Toco: Frequency: Every 2-4 minutes   Current lab results:  PLT      103  Assessment & Plan:  G1P0 @ [redacted]w[redacted]d, admitted for  Pregnancy and Labor/Delivery Management  1. Pain management: IV sedation.Epidural. 2. FWB: FHT category 1.  3. ID: GBS negative 4. Labor management: Cont Pitocin and active mgt of labor.  Protracted labor discussed.  Hopefully epidural will help.  All discussed with patient, see orders  Barnett Applebaum, MD, [facility], [facility] 04/11/2017  7:37 PM

## 2017-04-11 NOTE — Anesthesia Preprocedure Evaluation (Signed)
Anesthesia Evaluation  Patient identified by MRN, date of birth, ID band Patient awake    Reviewed: Allergy & Precautions, NPO status , Patient's Chart, lab work & pertinent test results  History of Anesthesia Complications Negative for: history of anesthetic complications  Airway Mallampati: II       Dental   Pulmonary neg pulmonary ROS,           Cardiovascular negative cardio ROS       Neuro/Psych negative neurological ROS     GI/Hepatic negative GI ROS, Neg liver ROS,   Endo/Other  negative endocrine ROS  Renal/GU negative Renal ROS     Musculoskeletal   Abdominal   Peds  Hematology  (+) anemia ,   Anesthesia Other Findings   Reproductive/Obstetrics                             Anesthesia Physical Anesthesia Plan  ASA: II  Anesthesia Plan: Epidural   Post-op Pain Management:    Induction:   PONV Risk Score and Plan:   Airway Management Planned:   Additional Equipment:   Intra-op Plan:   Post-operative Plan:   Informed Consent: I have reviewed the patients History and Physical, chart, labs and discussed the procedure including the risks, benefits and alternatives for the proposed anesthesia with the patient or authorized representative who has indicated his/her understanding and acceptance.     Plan Discussed with:   Anesthesia Plan Comments:         Anesthesia Quick Evaluation

## 2017-04-12 ENCOUNTER — Encounter: Payer: Self-pay | Admitting: Obstetrics & Gynecology

## 2017-04-12 ENCOUNTER — Encounter
Admission: EM | Disposition: A | Discharge status: Home / Self Care | Payer: Self-pay | Source: Home / Self Care | Attending: Obstetrics & Gynecology

## 2017-04-12 DIAGNOSIS — Z3A40 40 weeks gestation of pregnancy: Secondary | ICD-10-CM

## 2017-04-12 DIAGNOSIS — O480 Post-term pregnancy: Secondary | ICD-10-CM

## 2017-04-12 LAB — CBC
HEMATOCRIT: 28.1 % — AB (ref 35.0–47.0)
Hemoglobin: 9.7 g/dL — ABNORMAL LOW (ref 12.0–16.0)
MCH: 32.4 pg (ref 26.0–34.0)
MCHC: 34.4 g/dL (ref 32.0–36.0)
MCV: 94.1 fL (ref 80.0–100.0)
PLATELETS: 93 10*3/uL — AB (ref 150–440)
RBC: 2.99 MIL/uL — ABNORMAL LOW (ref 3.80–5.20)
RDW: 13.9 % (ref 11.5–14.5)
WBC: 13.3 10*3/uL — AB (ref 3.6–11.0)

## 2017-04-12 LAB — RPR: RPR Ser Ql: NONREACTIVE

## 2017-04-12 SURGERY — Surgical Case
Anesthesia: Epidural | Wound class: Clean Contaminated

## 2017-04-12 MED ORDER — NALBUPHINE HCL 10 MG/ML IJ SOLN: 5.0000 mg | INTRAMUSCULAR | Status: DC | PRN

## 2017-04-12 MED ORDER — EPHEDRINE 5 MG/ML INJ: 10.0000 mg | INTRAVENOUS | Status: DC | PRN

## 2017-04-12 MED ORDER — PROPOFOL 10 MG/ML IV BOLUS
INTRAVENOUS | Status: AC
  Filled 2017-04-12: qty 20

## 2017-04-12 MED ORDER — NALOXONE HCL 0.4 MG/ML IJ SOLN: 0.4000 mg | INTRAMUSCULAR | Status: DC | PRN

## 2017-04-12 MED ORDER — SIMETHICONE 80 MG PO CHEW: 80.0000 mg | CHEWABLE_TABLET | ORAL | Status: DC | PRN

## 2017-04-12 MED ORDER — DIPHENHYDRAMINE HCL 50 MG/ML IJ SOLN: 12.5000 mg | INTRAMUSCULAR | Status: DC | PRN

## 2017-04-12 MED ORDER — OXYCODONE-ACETAMINOPHEN 5-325 MG PO TABS
1.0000 | ORAL_TABLET | ORAL | Status: DC | PRN
  Administered 2017-04-12 – 2017-04-15 (×5): 1 via ORAL
  Filled 2017-04-12 (×6): qty 1

## 2017-04-12 MED ORDER — PHENYLEPHRINE 40 MCG/ML (10ML) SYRINGE FOR IV PUSH (FOR BLOOD PRESSURE SUPPORT): 80.0000 ug | PREFILLED_SYRINGE | INTRAVENOUS | Status: DC | PRN

## 2017-04-12 MED ORDER — SIMETHICONE 80 MG PO CHEW
80.0000 mg | CHEWABLE_TABLET | Freq: Three times a day (TID) | ORAL | Status: DC
  Administered 2017-04-12 – 2017-04-15 (×8): 80 mg via ORAL
  Filled 2017-04-12 (×9): qty 1

## 2017-04-12 MED ORDER — NALBUPHINE HCL 10 MG/ML IJ SOLN: 5.0000 mg | Freq: Once | INTRAMUSCULAR | Status: DC | PRN

## 2017-04-12 MED ORDER — WITCH HAZEL-GLYCERIN EX PADS: 1.0000 "application " | MEDICATED_PAD | CUTANEOUS | Status: DC | PRN

## 2017-04-12 MED ORDER — KETOROLAC TROMETHAMINE 30 MG/ML IJ SOLN
30.0000 mg | Freq: Four times a day (QID) | INTRAMUSCULAR | Status: AC
  Administered 2017-04-12 – 2017-04-13 (×4): 30 mg via INTRAVENOUS
  Filled 2017-04-12 (×4): qty 1

## 2017-04-12 MED ORDER — ONDANSETRON HCL 4 MG/2ML IJ SOLN
INTRAMUSCULAR | Status: AC
  Filled 2017-04-12: qty 2

## 2017-04-12 MED ORDER — LIDOCAINE HCL (PF) 2 % IJ SOLN
INTRAMUSCULAR | Status: AC
  Filled 2017-04-12: qty 8

## 2017-04-12 MED ORDER — LIDOCAINE HCL (PF) 2 % IJ SOLN
INTRAMUSCULAR | Status: DC | PRN
  Administered 2017-04-12 (×4): 5 mg via EPIDURAL

## 2017-04-12 MED ORDER — BUPIVACAINE 0.25 % ON-Q PUMP DUAL CATH 400 ML
400.0000 mL | INJECTION | Status: DC
  Filled 2017-04-12: qty 400

## 2017-04-12 MED ORDER — FENTANYL 2.5 MCG/ML W/ROPIVACAINE 0.15% IN NS 100 ML EPIDURAL (ARMC): 12.0000 mL/h | EPIDURAL | Status: DC

## 2017-04-12 MED ORDER — ZOLPIDEM TARTRATE 5 MG PO TABS: 5.0000 mg | ORAL_TABLET | Freq: Every evening | ORAL | Status: DC | PRN

## 2017-04-12 MED ORDER — CEFOXITIN SODIUM-DEXTROSE 2-2.2 GM-% IV SOLR (PREMIX)
2.0000 g | INTRAVENOUS | Status: AC
  Administered 2017-04-12: 2 g via INTRAVENOUS
  Filled 2017-04-12: qty 50

## 2017-04-12 MED ORDER — MENTHOL 3 MG MT LOZG
1.0000 | LOZENGE | OROMUCOSAL | Status: DC | PRN
  Filled 2017-04-12: qty 9

## 2017-04-12 MED ORDER — PHENYLEPHRINE HCL 10 MG/ML IJ SOLN
INTRAMUSCULAR | Status: AC
  Filled 2017-04-12: qty 1

## 2017-04-12 MED ORDER — DIPHENHYDRAMINE HCL 25 MG PO CAPS: 25.0000 mg | ORAL_CAPSULE | ORAL | Status: DC | PRN

## 2017-04-12 MED ORDER — LACTATED RINGERS IV SOLN: INTRAVENOUS | Status: DC

## 2017-04-12 MED ORDER — BUPIVACAINE HCL (PF) 0.5 % IJ SOLN
INTRAMUSCULAR | Status: AC
  Filled 2017-04-12: qty 30

## 2017-04-12 MED ORDER — ACETAMINOPHEN 325 MG PO TABS: 650.0000 mg | ORAL_TABLET | ORAL | Status: DC | PRN

## 2017-04-12 MED ORDER — SCOPOLAMINE 1 MG/3DAYS TD PT72: 1.0000 | MEDICATED_PATCH | Freq: Once | TRANSDERMAL | Status: DC

## 2017-04-12 MED ORDER — MORPHINE SULFATE (PF) 0.5 MG/ML IJ SOLN
INTRAMUSCULAR | Status: DC | PRN
  Administered 2017-04-12: 1 mg via EPIDURAL

## 2017-04-12 MED ORDER — COCONUT OIL OIL
1.0000 "application " | TOPICAL_OIL | Status: DC | PRN
  Administered 2017-04-12: 1 via TOPICAL
  Filled 2017-04-12: qty 120

## 2017-04-12 MED ORDER — MEPERIDINE HCL 25 MG/ML IJ SOLN: 6.2500 mg | INTRAMUSCULAR | Status: DC | PRN

## 2017-04-12 MED ORDER — ONDANSETRON HCL 4 MG/2ML IJ SOLN
4.0000 mg | Freq: Once | INTRAMUSCULAR | Status: AC | PRN
  Administered 2017-04-12: 4 mg via INTRAVENOUS
  Filled 2017-04-12: qty 2

## 2017-04-12 MED ORDER — SIMETHICONE 80 MG PO CHEW
80.0000 mg | CHEWABLE_TABLET | ORAL | Status: DC
  Administered 2017-04-13 – 2017-04-15 (×3): 80 mg via ORAL
  Filled 2017-04-12 (×2): qty 1

## 2017-04-12 MED ORDER — FENTANYL CITRATE (PF) 100 MCG/2ML IJ SOLN
INTRAMUSCULAR | Status: AC
  Administered 2017-04-12: 25 ug via INTRAVENOUS
  Filled 2017-04-12: qty 2

## 2017-04-12 MED ORDER — IBUPROFEN 600 MG PO TABS
600.0000 mg | ORAL_TABLET | Freq: Four times a day (QID) | ORAL | Status: DC | PRN
  Administered 2017-04-13 – 2017-04-15 (×8): 600 mg via ORAL
  Filled 2017-04-12 (×9): qty 1

## 2017-04-12 MED ORDER — DIBUCAINE 1 % RE OINT: 1.0000 "application " | TOPICAL_OINTMENT | RECTAL | Status: DC | PRN

## 2017-04-12 MED ORDER — MORPHINE SULFATE (PF) 0.5 MG/ML IJ SOLN
INTRAMUSCULAR | Status: AC
  Filled 2017-04-12: qty 10

## 2017-04-12 MED ORDER — BUPIVACAINE HCL 0.25 % IJ SOLN
INTRAMUSCULAR | Status: DC | PRN
  Administered 2017-04-12: 10 mL

## 2017-04-12 MED ORDER — MORPHINE SULFATE (PF) 2 MG/ML IV SOLN: 1.0000 mg | INTRAVENOUS | Status: DC | PRN

## 2017-04-12 MED ORDER — LACTATED RINGERS IV SOLN: 500.0000 mL | Freq: Once | INTRAVENOUS | Status: DC

## 2017-04-12 MED ORDER — SENNOSIDES-DOCUSATE SODIUM 8.6-50 MG PO TABS
2.0000 | ORAL_TABLET | ORAL | Status: DC
  Administered 2017-04-13 – 2017-04-15 (×3): 2 via ORAL
  Filled 2017-04-12 (×3): qty 2

## 2017-04-12 MED ORDER — ONDANSETRON HCL 4 MG/2ML IJ SOLN
INTRAMUSCULAR | Status: DC | PRN
Start: 2017-04-12 — End: 2017-04-12
  Administered 2017-04-12: 4 mg via INTRAVENOUS

## 2017-04-12 MED ORDER — SODIUM CHLORIDE 0.9% FLUSH: 3.0000 mL | INTRAVENOUS | Status: DC | PRN

## 2017-04-12 MED ORDER — SOD CITRATE-CITRIC ACID 500-334 MG/5ML PO SOLN
30.0000 mL | ORAL | Status: AC
  Administered 2017-04-12: 30 mL via ORAL
  Filled 2017-04-12: qty 15

## 2017-04-12 MED ORDER — PRENATAL MULTIVITAMIN CH
1.0000 | ORAL_TABLET | Freq: Every day | ORAL | Status: DC
  Administered 2017-04-12 – 2017-04-14 (×3): 1 via ORAL
  Filled 2017-04-12 (×3): qty 1

## 2017-04-12 MED ORDER — DIPHENHYDRAMINE HCL 25 MG PO CAPS: 25.0000 mg | ORAL_CAPSULE | Freq: Four times a day (QID) | ORAL | Status: DC | PRN

## 2017-04-12 MED ORDER — FENTANYL CITRATE (PF) 100 MCG/2ML IJ SOLN
25.0000 ug | INTRAMUSCULAR | Status: AC | PRN
  Administered 2017-04-12 (×6): 25 ug via INTRAVENOUS
  Filled 2017-04-12: qty 2

## 2017-04-12 MED ORDER — ONDANSETRON HCL 4 MG/2ML IJ SOLN: 4.0000 mg | Freq: Three times a day (TID) | INTRAMUSCULAR | Status: DC | PRN

## 2017-04-12 MED ORDER — LACTATED RINGERS IV SOLN
INTRAVENOUS | Status: DC
  Administered 2017-04-12 – 2017-04-13 (×3): via INTRAVENOUS

## 2017-04-12 MED ORDER — BUPIVACAINE HCL 0.5 % IJ SOLN: 10.0000 mL | Freq: Once | INTRAMUSCULAR | Status: DC

## 2017-04-12 MED ORDER — OXYCODONE-ACETAMINOPHEN 5-325 MG PO TABS
2.0000 | ORAL_TABLET | ORAL | Status: DC | PRN
  Administered 2017-04-13 – 2017-04-15 (×7): 2 via ORAL
  Filled 2017-04-12 (×7): qty 2

## 2017-04-12 MED ORDER — OXYTOCIN 40 UNITS IN LACTATED RINGERS INFUSION - SIMPLE MED: 2.5000 [IU]/h | INTRAVENOUS | Status: AC

## 2017-04-12 MED ORDER — NALOXONE HCL 2 MG/2ML IJ SOSY: 1.0000 ug/kg/h | PREFILLED_SYRINGE | INTRAVENOUS | Status: DC | PRN

## 2017-04-12 MED ORDER — SUCCINYLCHOLINE CHLORIDE 200 MG/10ML IV SOSY
PREFILLED_SYRINGE | INTRAVENOUS | Status: AC
Start: 2017-04-12 — End: 2017-04-12
  Filled 2017-04-12: qty 10

## 2017-04-12 MED ORDER — EPHEDRINE SULFATE 50 MG/ML IJ SOLN
INTRAMUSCULAR | Status: AC
  Filled 2017-04-12: qty 1

## 2017-04-12 SURGICAL SUPPLY — 25 items
BARRIER ADHS 3X4 INTERCEED (GAUZE/BANDAGES/DRESSINGS) ×3 IMPLANT
CANISTER SUCT 3000ML PPV (MISCELLANEOUS) ×3 IMPLANT
CATH KIT ON-Q SILVERSOAK 5IN (CATHETERS) ×6 IMPLANT
CHLORAPREP W/TINT 26ML (MISCELLANEOUS) ×6 IMPLANT
DERMABOND ADVANCED (GAUZE/BANDAGES/DRESSINGS) ×4
DERMABOND ADVANCED .7 DNX12 (GAUZE/BANDAGES/DRESSINGS) ×2 IMPLANT
DRSG OPSITE POSTOP 4X10 (GAUZE/BANDAGES/DRESSINGS) ×3 IMPLANT
ELECT CAUTERY BLADE 6.4 (BLADE) ×3 IMPLANT
ELECT REM PT RETURN 9FT ADLT (ELECTROSURGICAL) ×3
ELECTRODE REM PT RTRN 9FT ADLT (ELECTROSURGICAL) ×1 IMPLANT
GLOVE SKINSENSE NS SZ8.0 LF (GLOVE) ×2
GLOVE SKINSENSE STRL SZ8.0 LF (GLOVE) ×1 IMPLANT
GOWN STRL REUS W/ TWL LRG LVL3 (GOWN DISPOSABLE) ×1 IMPLANT
GOWN STRL REUS W/ TWL XL LVL3 (GOWN DISPOSABLE) ×2 IMPLANT
GOWN STRL REUS W/TWL LRG LVL3 (GOWN DISPOSABLE) ×2
GOWN STRL REUS W/TWL XL LVL3 (GOWN DISPOSABLE) ×4
NS IRRIG 1000ML POUR BTL (IV SOLUTION) ×3 IMPLANT
PACK C SECTION AR (MISCELLANEOUS) ×3 IMPLANT
PAD OB MATERNITY 4.3X12.25 (PERSONAL CARE ITEMS) ×3 IMPLANT
PAD PREP 24X41 OB/GYN DISP (PERSONAL CARE ITEMS) ×3 IMPLANT
SPONGE LAP 18X18 5 PK (GAUZE/BANDAGES/DRESSINGS) ×3 IMPLANT
SUT MAXON ABS #0 GS21 30IN (SUTURE) ×6 IMPLANT
SUT VIC AB 1 CT1 36 (SUTURE) ×12 IMPLANT
SUT VIC AB 2-0 CT1 36 (SUTURE) ×3 IMPLANT
SUT VIC AB 4-0 FS2 27 (SUTURE) ×3 IMPLANT

## 2017-04-12 NOTE — Anesthesia Post-op Follow-up Note (Cosign Needed)
Anesthesia QCDR form completed.        

## 2017-04-12 NOTE — Progress Notes (Addendum)
Admit Date: 04/11/2017 Today's Date: 04/12/2017  Subjective: Postpartum Day 1/2: Cesarean Delivery Patient reports incisional pain and tolerating PO.    Objective: Vital signs in last 24 hours: Temp:  [97.7 F (36.5 C)-98.4 F (36.9 C)] 98 F (36.7 C) (06/17 0746) Pulse Rate:  [78-122] 78 (06/17 0746) Resp:  [16-30] 18 (06/17 0746) BP: (112-140)/(68-96) 132/68 (06/17 0746) SpO2:  [95 %-100 %] 98 % (06/17 0746)  Physical Exam:  General: alert, cooperative and no distress Lochia: appropriate Uterine Fundus: firm Incision: healing well DVT Evaluation: No evidence of DVT seen on physical exam. Negative Homan's sign.   Recent Labs  04/11/17 0524 04/12/17 0522  HGB 11.2* 9.7*  HCT 32.3* 28.1*    Assessment/Plan: Status post Cesarean section.  Anemia following surgery. PPD1.   Doing well postoperatively.  Continue current care. Encourage diet, amb.  Remove foley later today. Fe for anemia, monitor for sx's. Breast feeding  Heather Conner 04/12/2017, 9:53 AM

## 2017-04-12 NOTE — Progress Notes (Signed)
  Labor Progress Note   26 y.o. G1P0 @ [redacted]w[redacted]d , admitted for  Pregnancy, Labor Management. IOL POST DATES  Subjective:  Pain relieved with epidural.  Inadequate dilation with relaxation, Pitocin, SROM earlier.  Objective:  BP 123/86   Pulse (!) 122   Temp 98.2 F (36.8 C) (Oral)   Resp 16   Ht 5\' 1"  (1.549 m)   Wt 184 lb (83.5 kg)   LMP 06/29/2016   SpO2 99%   BMI 34.77 kg/m  Abd: moderate Extr: trace to 1+ bilateral pedal edema SVE: CERVIX: 5 cm dilated, 80 effaced, -2 station (no change over several hours)  EFM: FHR: 140 bpm, variability: moderate,  accelerations:  Present,  decelerations:  Absent     Occas decel with or after ctxs Toco: Frequency: Every 2-4 minutes   Assessment & Plan:  G1P0 @ [redacted]w[redacted]d, admitted for  Pregnancy and Labor/Delivery Management Arrest of Dilation, CPD  1. Pain management: IV sedation.Epidural. 2. FWB: FHT category 1.  3. ID: GBS negative 4. Labor management: CS vs continued labor mgt discussed The risks of cesarean section discussed with the patient included but were not limited to: bleeding which may require transfusion or reoperation; infection which may require antibiotics; injury to bowel, bladder, ureters or other surrounding organs; injury to the fetus; need for additional procedures including hysterectomy in the event of a life-threatening hemorrhage; placental abnormalities wth subsequent pregnancies, incisional problems, thromboembolic phenomenon and other postoperative/anesthesia complications. The patient concurred with the proposed plan, giving informed written consent for the procedure.   All discussed with patient, see orders  Barnett Applebaum, MD, [facility], [facility] 04/12/2017  12:12 AM

## 2017-04-12 NOTE — Discharge Instructions (Signed)

## 2017-04-12 NOTE — Transfer of Care (Signed)
Immediate Anesthesia Transfer of Care Note  Patient: Heather Conner  Procedure(s) Performed: Procedure(s): CESAREAN SECTION (N/A)  Patient Location: PACU  Anesthesia Type:Epidural  Level of Consciousness: awake, alert  and oriented  Airway & Oxygen Therapy: Patient Spontanous Breathing  Post-op Assessment: Report given to RN and Post -op Vital signs reviewed and stable  Post vital signs: Reviewed and stable  Last Vitals:  Vitals:   04/11/17 2348 04/12/17 0154  BP: 123/86 (!) 134/92  Pulse: (!) 122 (!) 112  Resp:  18  Temp:  36.7 C    Last Pain:  Vitals:   04/12/17 0154  TempSrc: Tympanic  PainSc:          Complications: No apparent anesthesia complications

## 2017-04-12 NOTE — OR Nursing (Signed)
Sex: Boy Born 0110 Weight 9lb 12 oz  Apgar 8   9

## 2017-04-12 NOTE — Discharge Summary (Signed)
OB Discharge Summary     Patient Name: Heather Conner DOB: 03-19-1991 MRN: [id number]  Date of admission: 04/11/2017 Delivering MD: Barnett Applebaum MD Date of Delivery: 04/15/2017  Date of discharge: 04/15/2017  Admitting diagnosis: contractions Failure to progress  Intrauterine pregnancy: [redacted]w[redacted]d     Secondary diagnosis: Post Dates     Discharge diagnosis: Term Pregnancy Delivered and CPD/Arrest of dilation                                                                                                Post partum procedures:none  Augmentation: Pitocin and Cytotec  Complications: None  Hospital course:  Induction of Labor With Cesarean Section  26 y.o. yo G1P0 at [redacted]w[redacted]d was admitted to the hospital 04/11/2017 for induction of labor. Patient had a labor course significant for SROM, Cytotec and Pitocin use.  Epidural.  Arrest of dilation at 5 cm. The patient went for cesarean section due to Arrest of Dilation, and delivered a Viable infant, Membrane Rupture Time/Date: )12:22 PM ,04/11/2017   Details of operation can be found in separate operative Note.  Patient had an uncomplicated postpartum course. She is ambulating, tolerating a regular diet, passing flatus, and urinating well.  Patient is discharged home in stable condition on 04/15/17.                                    Physical exam  Vitals:   04/14/17 1944 04/14/17 2316 04/15/17 0300 04/15/17 0842  BP: 118/81 117/80  125/80  Pulse: (!) 102 (!) 105  92  Resp: 18 18  18   Temp: 97.7 F (36.5 C) 98.4 F (36.9 C) 98.6 F (37 C) 98.2 F (36.8 C)  TempSrc: Oral Oral Oral Oral  SpO2: 98% 98%  99%  Weight:      Height:       General: alert, cooperative and no distress Lochia: appropriate Uterine Fundus: firm Incision: Healing well with no significant drainage DVT Evaluation: No evidence of DVT seen on physical exam.  Labs: Lab Results  Component Value Date   WBC 9.4 04/14/2017   HGB 9.2 (L) 04/14/2017   HCT 26.6 (L) 04/14/2017   MCV 95.8 04/14/2017   PLT 130 (L) 04/14/2017   CMP Latest Ref Rng & Units 09/19/2015  Glucose 70 - 99 mg/dL 77  BUN 6 - 23 mg/dL 14  Creatinine 0.40 - 1.20 mg/dL 0.78  Sodium 135 - 145 mEq/L 137  Potassium 3.5 - 5.1 mEq/L 4.3  Chloride 96 - 112 mEq/L 103  CO2 19 - 32 mEq/L 25  Calcium 8.4 - 10.5 mg/dL 9.8  Total Protein 6.0 - 8.3 g/dL 7.7  Total Bilirubin 0.2 - 1.2 mg/dL 0.3  Alkaline Phos 39 - 117 U/L 76  AST 0 - 37 U/L 16  ALT 0 - 35 U/L 13    Discharge instruction: per After Visit Summary.  Medications:  Allergies as of 04/15/2017   No Known Allergies     Medication List    TAKE these medications   FUSION  65-65-25-30 MG Caps Take 1 tablet by mouth daily.   ibuprofen 600 MG tablet Commonly known as:  ADVIL,MOTRIN Take 1 tablet (600 mg total) by mouth every 6 (six) hours as needed for mild pain.   oxyCODONE-acetaminophen 5-325 MG tablet Commonly known as:  PERCOCET/ROXICET Take 1-2 tablets by mouth every 4 (four) hours as needed (pain scale > 7).   PRENATAL VITAMIN PO Take by mouth.       Diet: routine diet  Activity: Advance as tolerated. Pelvic rest for 6 weeks.   Outpatient follow up: Follow-up Information    Gae Dry, MD. Schedule an appointment as soon as possible for a visit in 2 week(s).   Specialty:  Obstetrics and Gynecology Contact information: [address] [place] [address] [phone]             Postpartum contraception: Not Discussed Rhogam Given postpartum: no Rubella vaccine given postpartum: no Varicella vaccine given postpartum: no TDaP given antepartum or postpartum: yes  Newborn Data: Live born unspecified sex  Birth Weight: 9 lb 11.6 oz (4410 g) APGAR: 8, 9   Baby Feeding: Breast  Disposition:home with mother  SIGNED:  [name], MD 04/15/2017 9:21 AM

## 2017-04-12 NOTE — Op Note (Signed)
Cesarean Section Procedure Note Indications: cephalo-pelvic disproportion, failure to progress: arrest of dilation and term intrauterine pregnancy  Pre-operative Diagnosis: Intrauterine pregnancy [redacted]w[redacted]d ;  cephalo-pelvic disproportion, failure to progress: arrest of dilation and term intrauterine pregnancy Post-operative Diagnosis: same, delivered. Procedure: Low Transverse Cesarean Section Surgeon: Barnett Applebaum, MD, FACOG Assistant(s): Rodena Medin Anesthesia: Epidural anesthesia Estimated Blood [id number] Complications: None; patient tolerated the procedure well. Disposition: PACU - hemodynamically stable. Condition: stable  Findings: A female infant in the cephalic presentation. Amniotic fluid - Clear  Birth weight 9-12 lbs.  Apgars of 8 and 9.  Intact placenta with a three-vessel cord. Grossly normal uterus, tubes and ovaries bilaterally. No intraabdominal adhesions were noted.  Procedure Details   The patient was taken to Operating Room, identified as the correct patient and the procedure verified as C-Section Delivery. A Time Out was held and the above information confirmed. After induction of anesthesia, the patient was draped and prepped in the usual sterile manner. A Pfannenstiel incision was made and carried down through the subcutaneous tissue to the fascia. Fascial incision was made and extended transversely with the Mayo scissors. The fascia was separated from the underlying rectus tissue superiorly and inferiorly. The peritoneum was identified and entered bluntly. Peritoneal incision was extended longitudinally. The utero-vesical peritoneal reflection was incised transversely and a bladder flap was created digitally.  A low transverse hysterotomy was made. The fetus was delivered atraumatically. The umbilical cord was clamped x2 and cut and the infant was handed to the awaiting pediatricians. The placenta was removed intact and appeared normal with a 3-vessel cord.  The uterus was  exteriorized and cleared of all clot and debris. The hysterotomy was closed with running sutures of 0 Vicryl suture. A second imbricating layer was placed with the same suture. Excellent hemostasis was observed. The uterus was returned to the abdomen. The pelvis was irrigated and again, excellent hemostasis was noted.  The On Q Pain pump System was then placed.  Trocars were placed through the abdominal wall into the subfascial space and these were used to thread the silver soaker cathaters into place.The rectus fascia was then reapproximated with running sutures of Maxon, with careful placement not to incorporate the cathaters. Subcutaneous tissues are then irrigated with saline and hemostasis assured.  Skin is then closed with 4-0 vicryl suture in a subcuticular fashion followed by skin adhesive. The cathaters are flushed each with 5 mL of Bupivicaine and stabilized into place with dressing. Instrument, sponge, and needle counts were correct prior to the abdominal closure and at the conclusion of the case.  The patient tolerated the procedure well and was transferred to the recovery room in stable condition.

## 2017-04-13 DIAGNOSIS — D649 Anemia, unspecified: Secondary | ICD-10-CM | POA: Diagnosis not present

## 2017-04-13 MED ORDER — FERROUS FUMARATE 324 (106 FE) MG PO TABS
1.0000 | ORAL_TABLET | Freq: Every day | ORAL | Status: DC
  Administered 2017-04-13 – 2017-04-14 (×2): 106 mg via ORAL
  Filled 2017-04-13 (×3): qty 1

## 2017-04-13 NOTE — Anesthesia Postprocedure Evaluation (Signed)
Anesthesia Post Note  Patient: Heather Conner  Procedure(s) Performed: Procedure(s) (LRB): CESAREAN SECTION (N/A)  Patient location during evaluation: Mother Baby Anesthesia Type: Epidural Level of consciousness: oriented and awake and alert Pain management: pain level controlled Vital Signs Assessment: post-procedure vital signs reviewed and stable Respiratory status: spontaneous breathing, respiratory function stable and patient connected to nasal cannula oxygen Cardiovascular status: blood pressure returned to baseline and stable Postop Assessment: no headache and no backache Anesthetic complications: no     Last Vitals:  Vitals:   04/13/17 0310 04/13/17 0734  BP: 124/83 115/83  Pulse: 99 90  Resp: 18 18  Temp: 36.7 C 36.9 C    Last Pain:  Vitals:   04/13/17 0800  TempSrc:   PainSc: 0-No pain                 Rolla Plate P

## 2017-04-13 NOTE — Progress Notes (Signed)
POD #1 LTCS for CPD Subjective:   Doing well. Denies lightheadedness when OOB. Tolerating regular diet and starting to pass gas. Voiding without difficulty. Baby having some  difficulty latching- using nipple shield  Objective:  Blood pressure 115/83, pulse 90, temperature 98.4 F (36.9 C), temperature source Oral, resp. rate 18, height 5\' 1"  (1.549 m), weight 83.5 kg (184 lb), last menstrual period 06/29/2016, SpO2 100 %.  General: NAD Heart: RRR without murmur Pulmonary: no increased work of breathing/ CTAB Abdomen: soft, but distended and tympanic, non-tender,  BS active Incision: Honey comb dressing C+D+I, ON Q intact Extremities:  no erythema, no tenderness Lochia: appropriate serosa Intake: 3373 ml/ UO: 4750 ml Results for orders placed or performed during the hospital encounter of 04/11/17 (from the past 72 hour(s))  CBC     Status: Abnormal   Collection Time: 04/11/17  5:24 AM  Result Value Ref Range   WBC 9.5 3.6 - 11.0 K/uL   RBC 3.41 (L) 3.80 - 5.20 MIL/uL   Hemoglobin 11.2 (L) 12.0 - 16.0 g/dL   HCT 32.3 (L) 35.0 - 47.0 %   MCV 94.7 80.0 - 100.0 fL   MCH 32.7 26.0 - 34.0 pg   MCHC 34.6 32.0 - 36.0 g/dL   RDW 14.1 11.5 - 14.5 %   Platelets 100 (L) 150 - 440 K/uL  RPR     Status: None   Collection Time: 04/11/17  5:24 AM  Result Value Ref Range   RPR Ser Ql Non Reactive Non Reactive    Comment: (NOTE) Performed At: [facility] [place], [place] [id number] Lindon Romp MD PQ:[id number]   Type and screen     Status: None   Collection Time: 04/11/17  5:24 AM  Result Value Ref Range   ABO/RH(D) O POS    Antibody Screen NEG    Sample Expiration 04/14/2017   Platelet count     Status: Abnormal   Collection Time: 04/11/17  2:20 PM  Result Value Ref Range   Platelets 100 (L) 150 - 440 K/uL  Platelet count     Status: Abnormal   Collection Time: 04/11/17  6:47 PM  Result Value Ref Range   Platelets 103 (L) 150 - 440 K/uL  CBC      Status: Abnormal   Collection Time: 04/12/17  5:22 AM  Result Value Ref Range   WBC 13.3 (H) 3.6 - 11.0 K/uL   RBC 2.99 (L) 3.80 - 5.20 MIL/uL   Hemoglobin 9.7 (L) 12.0 - 16.0 g/dL   HCT 28.1 (L) 35.0 - 47.0 %   MCV 94.1 80.0 - 100.0 fL   MCH 32.4 26.0 - 34.0 pg   MCHC 34.4 32.0 - 36.0 g/dL   RDW 13.9 11.5 - 14.5 %   Platelets 93 (L) 150 - 440 K/uL     Assessment:   26 y.o. G1P0 postoperative day # 1-stable  Ambulate to help stimulate bowel function  DC IV     Plan:  1) Acute blood loss anemia - hemodynamically stable and asymptomatic - po iron and vitamins  2) O POS/ RI/ VI  3) TDAP 01/27/2017  4) Breast/ contraception-undecided  Dalia Heading, CNM  5) Disposition

## 2017-04-13 NOTE — Lactation Note (Signed)
This note was copied from a baby's chart. Lactation Consultation Note  Patient Name: DEVINN VOSHELL [id number] Date: 04/13/2017 Reason for consult: Follow-up assessment   Maternal Data    Feeding Feeding Type: Breast Fed Length of feed: 6 min  LATCH Score/Interventions Latch: Repeated attempts needed to sustain latch, nipple held in mouth throughout feeding, stimulation needed to elicit sucking reflex. Intervention(s): Assist with latch;Adjust position;Breast compression  Audible Swallowing: A few with stimulation Intervention(s): Hand expression (no colostrum seen)  Type of Nipple: Everted at rest and after stimulation  Comfort (Breast/Nipple): Soft / non-tender     Hold (Positioning): Full assist, staff holds infant at breast Intervention(s): Breastfeeding basics reviewed;Support Pillows;Position options;Skin to skin  LATCH Score: 6  Lactation Tools Discussed/Used Tools: Nipple [employer]   Consult Status Consult Status: Follow-up Date: 04/14/17 Follow-up type: In-patient Parents can use syringe with formula in nipple shield at beginning of feeding if needed. If parents do not hear swallows, they are to f/u with up to 69mL of formula. Mom massaged and hand expressed and no colostrum was seen at the last feeding.    Marnee Spring 04/13/2017, 6:00 PM

## 2017-04-13 NOTE — Lactation Note (Signed)
This note was copied from a baby's chart. Lactation Consultation Note  Patient Name: Heather Conner [id number] Date: 04/13/2017 Reason for consult: Follow-up assessment   Maternal Data    Feeding Feeding Type: Breast Fed Length of feed: 20 min  LATCH Score/Interventions Latch: Grasps breast easily, tongue down, lips flanged, rhythmical sucking. Intervention(s): Adjust position;Assist with latch  Audible Swallowing: A few with stimulation Intervention(s): Hand expression;Skin to skin  Type of Nipple: Everted at rest and after stimulation  Comfort (Breast/Nipple): Soft / non-tender     Hold (Positioning): Assistance needed to correctly position infant at breast and maintain latch. Intervention(s): Breastfeeding basics reviewed;Support Pillows;Position options;Skin to skin  LATCH Score: 8  Lactation Tools Discussed/Used     Consult Status Consult Status: Follow-up Date: 04/13/17 Follow-up type: In-patient  Baby is feeding well w/o the assistance from a nipple shield and curved tip syringe. This was implemented do to swelling in mom's breast and low blood sgrs after delivery on 04/12/17. LC discontinued use of nipple shield and supplementation IF baby doesn't have good swallows and isn't satisfied after feedings.   Marnee Spring 04/13/2017, 4:54 PM

## 2017-04-13 NOTE — Anesthesia Post-op Follow-up Note (Signed)
  Anesthesia Pain Follow-up Note  Patient: Heather Conner  Day #: 1  Date of Follow-up: 04/13/2017 Time: 10:16 AM  Last Vitals:  Vitals:   04/13/17 0310 04/13/17 0734  BP: 124/83 115/83  Pulse: 99 90  Resp: 18 18  Temp: 36.7 C 36.9 C    Level of Consciousness: alert  Pain: mild   Side Effects:None  Catheter Site Exam:clean, dry     Plan: D/C from anesthesia care at surgeon's request  Brantley Fling

## 2017-04-14 LAB — CBC
HEMATOCRIT: 26.6 % — AB (ref 35.0–47.0)
HEMOGLOBIN: 9.2 g/dL — AB (ref 12.0–16.0)
MCH: 33.3 pg (ref 26.0–34.0)
MCHC: 34.7 g/dL (ref 32.0–36.0)
MCV: 95.8 fL (ref 80.0–100.0)
Platelets: 130 10*3/uL — ABNORMAL LOW (ref 150–440)
RBC: 2.78 MIL/uL — ABNORMAL LOW (ref 3.80–5.20)
RDW: 14.5 % (ref 11.5–14.5)
WBC: 9.4 10*3/uL (ref 3.6–11.0)

## 2017-04-14 MED ORDER — ONDANSETRON 4 MG PO TBDP
4.0000 mg | ORAL_TABLET | Freq: Four times a day (QID) | ORAL | Status: DC | PRN
  Administered 2017-04-14: 4 mg via ORAL
  Filled 2017-04-14: qty 1

## 2017-04-14 NOTE — Progress Notes (Signed)
  Subjective:   Post Op Day 2 Doing well tolerating PO intake and pain is controlled with PO medication and On Q pump. Ambulating and voiding without difficulty. Breastfeeding is going well. Patient is considering discharging to home later today pending progress today and discharge order for baby.   Objective:  Blood pressure 129/87, pulse (!) 115, temperature 98 F (36.7 C), temperature source Oral, resp. rate 18, height 5\' 1"  (1.549 m), weight 184 lb (83.5 kg), last menstrual period 06/29/2016, SpO2 100 %.  General: NAD Pulmonary: no increased work of breathing Abdomen: non-distended, non-tender, fundus firm at level of umbilicus Incision: C/D/I with waffle dressing and On Q pump in place Extremities: no edema, no erythema, no tenderness   Assessment:   26 y.o. G1P0 postoperativeday # 2   Plan:  1) Acute blood loss anemia - hemodynamically stable and asymptomatic - po ferrous sulfate  2) CBC  3) O positive, Rubella Immune, Varicella Immune  4) TDAP status UTD  5) Breast/Contraception: undecided  6) Disposition: possible discharge to home later today or tomorrow  Rod Can, CNM

## 2017-04-14 NOTE — Lactation Note (Signed)
This note was copied from a baby's chart. Lactation Consultation Note  Patient Name: Heather Conner Date: 04/14/2017 Reason for consult: Follow-up assessment;Difficult latch  Baby was sleepy this morning and refusing feeds despite trying many waking techniques. I had Mom use DEBP for 15 minutes while massaging breast. She obtained 5 ml colostrum. Baby then woke and nursed for 10 minutes on the other (right) breast, then let go. Nipple intact. Audible swallows. I did need to show Mom how to flange lips out a bit to make latch better. I also demonstrated how to unlatch baby from poor latch. Both parents verbalized and demonstrated use of pump and cleaning supplies. Th e5 ml of pumped milk will be used within next 4 hours. He did not seem to need it this time. Mom needs more practice on obtaining proper latch  Maternal Data    Feeding Feeding Type: Breast Fed Length of feed: 10 min  LATCH Score/Interventions Latch: Grasps breast easily, tongue down, lips flanged, rhythmical sucking. Intervention(s): Skin to skin;Teach feeding cues;Waking techniques Intervention(s): Assist with latch;Breast massage  Audible Swallowing: Spontaneous and intermittent  Type of Nipple: Everted at rest and after stimulation  Comfort (Breast/Nipple): Filling, red/small blisters or bruises, mild/mod discomfort  Problem noted: Cracked, bleeding, blisters, bruises (some bruising) Interventions  (Cracked/bleeding/bruising/blister): Expressed breast milk to nipple  Hold (Positioning): Assistance needed to correctly position infant at breast and maintain latch.  LATCH Score: 8  Lactation Tools Discussed/Used     Consult Status Consult Status: Follow-up Date: 04/15/17 Follow-up type: In-patient    Roque Cash 04/14/2017, 1:25 PM

## 2017-04-15 MED ORDER — IBUPROFEN 600 MG PO TABS: 600.0000 mg | ORAL_TABLET | Freq: Four times a day (QID) | ORAL | 1 refills | Status: DC | PRN

## 2017-04-15 MED ORDER — OXYCODONE-ACETAMINOPHEN 5-325 MG PO TABS: 1.0000 | ORAL_TABLET | ORAL | 0 refills | Status: DC | PRN

## 2017-04-15 NOTE — Lactation Note (Addendum)
This note was copied from a baby's chart. Lactation Consultation Note  Patient Name: Heather Conner Date: 04/15/2017 Reason for consult: Follow-up assessment  Heather Conner is at 8% weight loss but having more voids and stools. They have only offered him formula a couple times since yesterday as he has been breastfeeding much better. Mom's breasts heavier today, so I did a pre/post weight check. 27 ml intake after nursing 45 minutes total on both breasts. Mom still tweaking latch skills as she gets a little pinching of nipple near end of feeding, so I had her work on that. She is still wearing shells. She has F/U with MD in the a.m.  She has [facility] contact info and info on [facility] that her milk supply has increased and he takes full volumes at breast, no more formula needed and is best to avoid to prevent engorgement and milk supply issues.  Maternal Data    Feeding Feeding Type: Breast Fed Length of feed: 20 min  LATCH Score/Interventions Latch: Grasps breast easily, tongue down, lips flanged, rhythmical sucking.  Audible Swallowing: Spontaneous and intermittent  Type of Nipple: Everted at rest and after stimulation  Comfort (Breast/Nipple): Filling, red/small blisters or bruises, mild/mod discomfort  Problem noted: Cracked, bleeding, blisters, bruises Interventions  (Cracked/bleeding/bruising/blister): Expressed breast milk to nipple (adjusted latch ; coconut oil)  Hold (Positioning): No assistance needed to correctly position infant at breast.  LATCH Score: 9  Lactation Tools Discussed/Used     Consult Status Consult Status: PRN Follow-up type: Call as needed    Roque Cash 04/15/2017, 12:20 PM

## 2017-04-15 NOTE — Progress Notes (Signed)
Discharge instructions reviewed with patient and spouse.  Gave RX.  Reviewed book on removing On-Q pump.  Questions answered.  Surgical wound cleaning kit given.  Mom wheeled down with baby by volunteer.

## 2017-04-16 ENCOUNTER — Telehealth: Payer: Self-pay

## 2017-04-16 NOTE — Telephone Encounter (Signed)
FMLA/DISABILITY form for [employer] Filled out and given to [place] for processing

## 2017-04-21 ENCOUNTER — Telehealth: Payer: Self-pay | Admitting: Obstetrics & Gynecology

## 2017-04-21 ENCOUNTER — Encounter: Payer: Self-pay | Admitting: Obstetrics & Gynecology

## 2017-04-21 ENCOUNTER — Ambulatory Visit (INDEPENDENT_AMBULATORY_CARE_PROVIDER_SITE_OTHER): Payer: BC Managed Care – PPO | Admitting: Obstetrics & Gynecology

## 2017-04-21 VITALS — BP 120/80 | HR 120 | Ht 61.0 in | Wt 157.0 lb

## 2017-04-21 DIAGNOSIS — O34219 Maternal care for unspecified type scar from previous cesarean delivery: Secondary | ICD-10-CM

## 2017-04-21 DIAGNOSIS — Z0289 Encounter for other administrative examinations: Secondary | ICD-10-CM

## 2017-04-21 DIAGNOSIS — N61 Mastitis without abscess: Secondary | ICD-10-CM

## 2017-04-21 DIAGNOSIS — O9122 Nonpurulent mastitis associated with the puerperium: Secondary | ICD-10-CM | POA: Insufficient documentation

## 2017-04-21 MED ORDER — AMOXICILLIN 500 MG PO CAPS: 500.0000 mg | ORAL_CAPSULE | Freq: Three times a day (TID) | ORAL | 2 refills | Status: DC

## 2017-04-21 NOTE — Telephone Encounter (Signed)
Pt is calling to see if she can receive an Anabiotic for poss mastitis. Pt is seeing Dr. Kenton Kingfisher today at 1:30 and would like to start on medication before being seen. Please advise. [facility] [employer] on [address]

## 2017-04-21 NOTE — Patient Instructions (Signed)
Breastfeeding and Mastitis  Mastitis is inflammation of the breast tissue. It can occur in women who are breastfeeding. This can make breastfeeding painful. Mastitis will sometimes go away on its own, especially if it is not caused by an infection (non-infectious mastitis). Your health care provider will help determine if medical treatment is needed. Treatment may be needed if the condition is caused by a bacterial infection (infectious mastitis).  What are the causes?  This condition is often associated with a blocked milkduct, which can happen when too much milk builds up in the breast. Causes of excess milk in the breast can include:  · Poor latch-on. If your baby is not latched onto the breast properly, he or she may not empty your breast completely while breastfeeding.  · Allowing too much time to pass between feedings.  · Wearing a bra or other clothing that is too tight. This puts extra pressure on the milk ducts so milk does not flow through them as it should.  · Milk remaining in the breast because it is overfilled (engorged).  · Stress and fatigue.    Mastitis can also be caused by a bacterial infection. Bacteria may enter the breast tissue through cuts, cracks, or openings in the skin near the nipple area. Cracks in the skin are often caused when your baby does not latch on properly to the breast.  What are the signs or symptoms?  Symptoms of this condition include:  · Swelling, redness, tenderness, and pain in an area of the breast. This usually affects the upper part of the breast, toward the armpit region. In most cases, it affects only one breast. In some cases, it may occur on both breasts at the same time and affect a larger portion of breast tissue.  · Swelling of the glands under the arm on the same side.  · Fatigue, headache, and flu-like muscle aches.  · Fever.  · Rapid pulse.    Symptoms usually last 2 to 5 days. Breast pain and redness are at their worst on day 2 and day 3, and they usually go  away by day 5. If an infection is left to progress, a collection of pus (abscess) may develop.  How is this diagnosed?  This condition can be diagnosed based on your symptoms and a physical exam. You may also have tests, such as:  · Blood tests to determine if your body is fighting a bacterial infection.  · Mammogram or ultrasound tests to rule out other problems or diseases.  · Fluid tests. If an abscess has developed, the fluid in the abscess may be removed with a needle. The fluid may be analyzed to determine if bacteria are present.  · Breast milk may be cultured and tested for bacteria.    How is this treated?  This condition will sometimes go away on its own. Your health care provider may choose to wait 24 hours after first seeing you to decide whether treatment is needed. If treatment is needed, it may include:  · Strategies to manage breastfeeding. This includes continuing to breastfeed or pump in order to allow adequate milk flow, using breast massage, and applying heat or cold to the affected area.  · Self-care such as rest and increased fluid intake.  · Medicine for pain.  · Antibiotic medicine to treat a bacterial infection. This is usually taken by mouth.  · If an abscess has developed, it may be treated by removing fluid with a needle.      Follow these instructions at home:  Medicines  · Take over-the-counter and prescription medicines only as told by your health care provider.  · If you were prescribed an antibiotic medicine, take it as told by your health care provider. Do not stop taking the antibiotic even if you start to feel better.  General instructions  · Do not wear a tight or underwire bra. Wear a soft, supportive bra.  · Increase your fluid intake, especially if you have a fever.  · Get plenty of rest.  For breastfeeding:  · Continue to empty your breasts as often as possible, either by breastfeeding or using an electric breast pump. This will lower the pressure and the pain that comes with  it. Ask your health care provider if changes need to be made to your breastfeeding or pumping routine.  · Keep your nipples clean and dry.  · During breastfeeding, empty the first breast completely before going to the other breast. If your baby is not emptying your breasts completely, use a breast pump to empty your breasts.  · Use breast massage during feeding or pumping sessions.  · If directed, apply moist heat to the affected area of your breast right before breastfeeding or pumping. Use the heat source that your health care provider recommends.  · If directed, put ice on the affected area of your breast right after breastfeeding or pumping:  ? Put ice in a plastic bag.  ? Place a towel between your skin and the bag.  ? Leave the ice on for 20 minutes.  · If you go back to work, pump your breasts while at work to stay in time with your nursing schedule.  · Do not allow your breasts to become engorged.  Contact a health care provider if:  · You have pus-like discharge from the breast.  · You have a fever.  · Your symptoms do not improve within 2 days of starting treatment.  · Your symptoms return after you have recovered from a breast infection.  Get help right away if:  · Your pain and swelling are getting worse.  · You have pain that is not controlled with medicine.  · You have a red line extending from the breast toward your armpit.  Summary  · Mastitis is inflammation of the breast tissue. It is often caused by a blocked milk duct or bacteria.  · This condition may be treated with hot and cold compresses, medicines, self-care, and certain breastfeeding strategies.  · If you were prescribed an antibiotic medicine, take it as told by your health care provider. Do not stop taking the antibiotic even if you start to feel better.  · Continue to empty your breasts as often as possible either by breastfeeding or using an electric breast pump.  This information is not intended to replace advice given to you by your  health care provider. Make sure you discuss any questions you have with your health care provider.  Document Released: 02/07/2005 Document Revised: 10/14/2016 Document Reviewed: 10/14/2016  Elsevier Interactive Patient Education © 2017 Elsevier Inc.

## 2017-04-21 NOTE — Telephone Encounter (Signed)
Pt aware will need to be seen prior to antibiotic being sent in. Pt is using tylenol and ibuprofen and warm compresses to help with relief.

## 2017-04-21 NOTE — Progress Notes (Signed)
  Postoperative Follow-up Patient presents post op from CS for FTP, 1 week ago.  Subjective: Patient reports marked improvement in her preop symptoms. Eating a regular diet without difficulty. Pain is controlled without any medications.  Activity: normal activities of daily living. Patient reports vaginal sx's of Irregular bleeding/lochia that is improving.  Right breast redness x 2 days, fever one time yesterday.  Objective: BP 120/80   Pulse (!) 120   Ht 5\' 1"  (1.549 m)   Wt 187 lb (84.8 kg)   LMP 06/29/2016   BMI 35.33 kg/m  Physical Exam  Constitutional: She is oriented to person, place, and time. She appears well-developed and well-nourished. No distress.  Cardiovascular: Normal rate.   Pulmonary/Chest: Effort normal. Right breast exhibits skin change.  R BR erythema in UIQ suggestive of mastiitis, no induration or mass  Abdominal: Soft. She exhibits no distension. There is no tenderness.  Incision Healing Well   Musculoskeletal: Normal range of motion.  Neurological: She is alert and oriented to person, place, and time. No cranial nerve deficit.  Skin: Skin is warm and dry.  Psychiatric: She has a normal mood and affect.    Assessment: s/p :  cesarean section stable  Mastitis  Plan: Patient has done well after surgery with no apparent complications.  I have discussed the post-operative course to date, and the expected progress moving forward.  The patient understands what complications to be concerned about.  I will see the patient in routine follow up, or sooner if needed.    Activity plan: No heavy lifting.  Mastitis- amoxicillin, monitor for worsening signs, cont to breastfeed  [name] 04/21/2017, 2:00 PM

## 2017-04-23 ENCOUNTER — Other Ambulatory Visit: Payer: Self-pay | Admitting: Obstetrics and Gynecology

## 2017-04-23 ENCOUNTER — Telehealth: Payer: Self-pay | Admitting: Obstetrics & Gynecology

## 2017-04-23 MED ORDER — CLINDAMYCIN HCL 300 MG PO CAPS: 300.0000 mg | ORAL_CAPSULE | Freq: Three times a day (TID) | ORAL | 0 refills | Status: AC

## 2017-04-23 NOTE — Progress Notes (Signed)
Spoke with patient, feels that symptoms have not improved since starting antibiotics with further induration and erythema.  No fevers.  Discussed inpatient treatment with Vancomycin vs switching to MRSA coverage outpatient.  Patient opts to switch po at this time.  Given within a month of delivery bactrim contra-indicated given risk of kernicterus.  14 day course of clindamycin called in will arrange follow up in the next week to assess response

## 2017-04-23 NOTE — Telephone Encounter (Signed)
Pt is calling needing to speak to a nurse about her symptoms from her antibiotic that was prescribe for mastitis. Please call

## 2017-04-23 NOTE — Telephone Encounter (Signed)
Pt saw [facility] on 6/26 for mastitis and states that her breast was feeling somewhat better yesterday after starting antibiotic but today it is much worse and feels like most of right breast is hard including the nipple. Per pt she has been using warm compresses and ibuprofen as well as trying to express milk through pumping and nursing but she is getting significantly less milk from her right side. Denies fever or chills but she is worried that she is getting worse. Please advise. Thank you.

## 2017-04-24 ENCOUNTER — Inpatient Hospital Stay
Admission: AD | Admit: 2017-04-24 | Discharge: 2017-04-30 | DRG: 776 | Disposition: A | Discharge status: Home / Self Care | Payer: BC Managed Care – PPO | Source: Ambulatory Visit | Attending: Obstetrics & Gynecology | Admitting: Obstetrics & Gynecology

## 2017-04-24 ENCOUNTER — Encounter: Payer: Self-pay | Admitting: Certified Nurse Midwife

## 2017-04-24 ENCOUNTER — Telehealth: Payer: Self-pay | Admitting: Obstetrics and Gynecology

## 2017-04-24 ENCOUNTER — Ambulatory Visit (INDEPENDENT_AMBULATORY_CARE_PROVIDER_SITE_OTHER): Payer: BC Managed Care – PPO | Admitting: Certified Nurse Midwife

## 2017-04-24 ENCOUNTER — Observation Stay: Payer: BC Managed Care – PPO

## 2017-04-24 VITALS — BP 126/78 | HR 129 | Temp 101.6°F | Wt 158.0 lb

## 2017-04-24 DIAGNOSIS — O9122 Nonpurulent mastitis associated with the puerperium: Principal | ICD-10-CM | POA: Diagnosis present

## 2017-04-24 DIAGNOSIS — O9123 Nonpurulent mastitis associated with lactation: Secondary | ICD-10-CM | POA: Diagnosis not present

## 2017-04-24 DIAGNOSIS — R509 Fever, unspecified: Secondary | ICD-10-CM

## 2017-04-24 DIAGNOSIS — N61 Mastitis without abscess: Secondary | ICD-10-CM

## 2017-04-24 LAB — CBC
HCT: 26.9 % — ABNORMAL LOW (ref 35.0–47.0)
Hemoglobin: 9.2 g/dL — ABNORMAL LOW (ref 12.0–16.0)
MCH: 32.2 pg (ref 26.0–34.0)
MCHC: 34.1 g/dL (ref 32.0–36.0)
MCV: 94.5 fL (ref 80.0–100.0)
PLATELETS: 471 10*3/uL — AB (ref 150–440)
RBC: 2.84 MIL/uL — ABNORMAL LOW (ref 3.80–5.20)
RDW: 14.5 % (ref 11.5–14.5)
WBC: 24 10*3/uL — AB (ref 3.6–11.0)

## 2017-04-24 LAB — BASIC METABOLIC PANEL
Anion gap: 8 (ref 5–15)
BUN: 13 mg/dL (ref 6–20)
CO2: 22 mmol/L (ref 22–32)
CREATININE: 0.77 mg/dL (ref 0.44–1.00)
Calcium: 8.8 mg/dL — ABNORMAL LOW (ref 8.9–10.3)
Chloride: 105 mmol/L (ref 101–111)
GFR calc Af Amer: >60 mL/min (ref >60)
Glucose, Bld: 95 mg/dL (ref 65–99)
Potassium: 3.6 mmol/L (ref 3.5–5.1)
SODIUM: 135 mmol/L (ref 135–145)

## 2017-04-24 MED ORDER — PRENATAL PLUS 27-1 MG PO TABS
1.0000 | ORAL_TABLET | Freq: Every day | ORAL | Status: DC
  Administered 2017-04-25 – 2017-04-29 (×5): 1 via ORAL
  Filled 2017-04-24 (×6): qty 1

## 2017-04-24 MED ORDER — SIMETHICONE 80 MG PO CHEW
80.0000 mg | CHEWABLE_TABLET | Freq: Four times a day (QID) | ORAL | Status: DC | PRN
  Administered 2017-04-25: 80 mg via ORAL
  Filled 2017-04-24: qty 1

## 2017-04-24 MED ORDER — ONDANSETRON HCL 4 MG PO TABS
4.0000 mg | ORAL_TABLET | Freq: Four times a day (QID) | ORAL | Status: DC | PRN
  Administered 2017-04-27: 4 mg via ORAL
  Filled 2017-04-24: qty 1

## 2017-04-24 MED ORDER — SODIUM CHLORIDE 0.9 % IV SOLN
INTRAVENOUS | Status: DC
  Administered 2017-04-24 – 2017-04-26 (×4): via INTRAVENOUS

## 2017-04-24 MED ORDER — IBUPROFEN 600 MG PO TABS
600.0000 mg | ORAL_TABLET | Freq: Four times a day (QID) | ORAL | Status: DC | PRN
  Administered 2017-04-24 – 2017-04-29 (×12): 600 mg via ORAL
  Filled 2017-04-24 (×12): qty 1

## 2017-04-24 MED ORDER — BREAST MILK
ORAL | Status: DC
  Filled 2017-04-24: qty 1

## 2017-04-24 MED ORDER — ONDANSETRON HCL 4 MG/2ML IJ SOLN: 4.0000 mg | Freq: Four times a day (QID) | INTRAMUSCULAR | Status: DC | PRN

## 2017-04-24 MED ORDER — OXYCODONE-ACETAMINOPHEN 5-325 MG PO TABS
1.0000 | ORAL_TABLET | ORAL | Status: DC | PRN
  Administered 2017-04-24 (×2): 2 via ORAL
  Administered 2017-04-25: 1 via ORAL
  Administered 2017-04-25 – 2017-04-26 (×2): 2 via ORAL
  Administered 2017-04-26: 1 via ORAL
  Administered 2017-04-26 – 2017-04-28 (×6): 2 via ORAL
  Administered 2017-04-29: 1 via ORAL
  Filled 2017-04-24: qty 2
  Filled 2017-04-24: qty 1
  Filled 2017-04-24: qty 2
  Filled 2017-04-24: qty 1
  Filled 2017-04-24: qty 2
  Filled 2017-04-24: qty 1
  Filled 2017-04-24 (×7): qty 2

## 2017-04-24 MED ORDER — VANCOMYCIN HCL IN DEXTROSE 1-5 GM/200ML-% IV SOLN
1000.0000 mg | Freq: Two times a day (BID) | INTRAVENOUS | Status: DC
  Administered 2017-04-24 – 2017-04-26 (×5): 1000 mg via INTRAVENOUS
  Filled 2017-04-24 (×6): qty 200

## 2017-04-24 MED ORDER — PRENATAL VITAMIN 27-0.8 MG PO TABS: 1.0000 | ORAL_TABLET | Freq: Every day | ORAL | Status: DC

## 2017-04-24 NOTE — Telephone Encounter (Signed)
Pt is asking if she could be seen today due to the anitbotic not seeming to up. Please advise work in for today for Mastitis

## 2017-04-24 NOTE — Telephone Encounter (Signed)
-----   Message from [name], MD sent at 04/23/2017 12:25 PM EDT ----- Regarding: Appointment Needs appointment in the next week to assess response to antibiotics for mastitis, any provider

## 2017-04-24 NOTE — Progress Notes (Addendum)
Obstetrics & Gynecology Office Visit   Chief Complaint:  Chief Complaint  Patient presents with  . mastitis    Right breast pain/redness/hard  C/S 6/17    History of Present Illness: 26 year old G13 P1001 s/p CS on 04/12/2017 had a postoperative check on 6/26 and was diagnosed with right mastitis. She was begun on Amoxicillin, but the area of inflammation and pain grew and last night had a temperature of 103 degrees. She was switched to clindamycin 300 mgm tid last night. Last took analgesic last night: Tylenol 1350 mgm. Has been trying to pump milk, but little milk coming from right breast. She has been getting milk from the left breast, but now there is redness in the left upper outer quadrant on that breast.   Review of Systems:  Review of Systems  Constitutional: Positive for fever. Negative for chills.  Respiratory: Negative for cough.        Breasts: see HPI     Past Medical History:  Past Medical History:  Diagnosis Date  . Anemia   . Eczema   . Endometriosis   . History of ovarian cyst   . Migraine    On continuous BC for prevention    Past Surgical History:  Past Surgical History:  Procedure Laterality Date  . ABLATION ON ENDOMETRIOSIS  2012  . CESAREAN SECTION N/A 04/12/2017   Procedure: CESAREAN SECTION;  Surgeon: Gae Dry, MD;  Location: [facility] ORS;  Service: Obstetrics;  Laterality: N/A;  . LAPAROSCOPIC OVARIAN CYSTECTOMY Right 2012     Obstetric History: OB History  Gravida Para Term Preterm AB Living  1 1 1     1   SAB TAB Ectopic Multiple Live Births          1    # Outcome Date GA Lbr Len/2nd Weight Sex Delivery Anes PTL Lv  1 Term 04/12/17 [redacted]w[redacted]d  9 lb 11.6 oz (4.41 kg) M CS-LVertical EPI  LIV      Family History:  Family History  Problem Relation Age of Onset  . Hypertension Mother   . Birth defects Mother        Congential heart condition   . Endometriosis Mother   . Cancer Father        Prostate  . Diabetes Paternal Grandfather     . Stroke Paternal Grandfather     Social History:  Social History   Social History  . Marital status: Married    Spouse name: Jhoana Upham - Fiance  . Number of children: N/A  . Years of education: 71   Occupational History  . Second Grade Teacher [employer]   Social History Main Topics  . Smoking status: Never Smoker  . Smokeless tobacco: Never Used  . Alcohol use No  . Drug use: No  . Sexual activity: Yes    Partners: Male    Birth control/ protection: Pill   Other Topics Concern  . Not on file   Social History Narrative   Kymiah was born and reared in [place], [place]. She graduated from [employer] in 2008 and then went to [employer] and obtained her Bachelors in [employer] in 2012. She currently lives at home with her parents. She has a turtle named Crush. 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 She enjoys photography, hanging out with friends. She enjoys action movies. She is active in her [employer], which is a  non-denominational church.      Caffeine- Coffee 1 cup, 1 small can of soda    Allergies:  No Known Allergies  Medications: Prior to Admission medications   Medication Sig Start Date End Date Taking? Authorizing Provider  clindamycin (CLEOCIN) 300 MG capsule Take 1 capsule (300 mg total) by mouth 3 (three) times daily. 04/23/17 05/07/17 Yes [name], MD  ibuprofen (ADVIL,MOTRIN) 600 MG tablet Take 1 tablet (600 mg total) by mouth every 6 (six) hours as needed for mild pain. 04/15/17  Yes [name], MD  oxyCODONE-acetaminophen (PERCOCET/ROXICET) 5-325 MG tablet Take 1-2 tablets by mouth every 4 (four) hours as needed (pain scale > 7). 04/15/17  Yes [name], MD  Prenatal Vit-Fe Fumarate-FA (PRENATAL VITAMIN PO) Take by mouth.   Yes [provider]    Physical Exam Vitals:  Vitals:   04/24/17 1512  BP: 126/78  Pulse: (!) 129   Temp: (!) 101.6 F (38.7 C)    Physical Exam  Constitutional: She appears well-developed and well-nourished. She appears distressed (tearful).  Respiratory: Effort normal. No respiratory distress.    Large area of Induration and inflammation of the upper inner right breast extending past nipple. Golf ball size firm area in upper inner right breast? Abscess formation. Right breast tender. Left breast in upper outer quadrant inflamed and tender.   Skin: Skin is warm and dry. She is not diaphoretic.     Assessment: 26 y.o. G1P1001 with bilateral mastitis and possible right breast abscess  Plan: Discussed plan of management with Dr Georgianne Fick. Will admit for further evaluation and treatment.  Dalia Heading, CNM

## 2017-04-24 NOTE — Progress Notes (Signed)
Pharmacy Antibiotic Note  Heather Conner is a 26 y.o. female admitted on 04/24/2017 with lactational mastitis.  Pharmacy has been consulted for vancomycin dosing.  Plan: Continue MD dosing for Vancomycin 1000 mg  IV every 12 hours.  Goal trough 15-20 mcg/mL. Trough before 4th dose.  Ke 0.070, half life 9.9, Vd 40     Temp (24hrs), Avg:100 F (37.8 C), Min:98.4 F (36.9 C), Max:101.6 F (38.7 C)   Recent Labs Lab 04/24/17 1716  WBC 24.0*  CREATININE 0.77    Estimated Creatinine Clearance: 94.9 mL/min (by C-G formula based on SCr of 0.77 mg/dL).    No Known Allergies  Antimicrobials this admission: Vancomycin 6/29 >>  Dose adjustments this admission:   Microbiology results:   Thank you for allowing pharmacy to be a part of this patient's care.  Rocky Morel 04/24/2017 6:50 PM

## 2017-04-24 NOTE — H&P (Signed)
Obstetrics & Gynecology H&P    Chief Complaint: Mastitis  History of Present Illness: Patient is a 26 y.o. G1P1001 s/p 1LTCS on 04/12/2017 for failure to progress who initially presented to the office on 04/21/2017 with fevers, breast tenderness, and redness.  She was started on amoxicillin po for mastitis.  The patient called 2 days later feeling that while she was not febrile symptoms were worsening not improving.  She was given the option of inpatient admission vs switching to antibiotic with MRSA coverage.  After discussion of pros and cons patient opted to swtich to clindamycin 300mg  po tid with follow up in the next week to assess response.  She noticed further worsening of symtptoms and was febrile to 103 at home.  She was evaluated in the office with progression from description at time of initial evaluation and an area of concern also noted on the left breast and agreeable to inpatient admission.    Review of Systems:10 point review of systems  Past Medical History:  Past Medical History:  Diagnosis Date  . Anemia   . Eczema   . Endometriosis   . History of ovarian cyst   . Migraine    On continuous BC for prevention    Past Surgical History:  Past Surgical History:  Procedure Laterality Date  . ABLATION ON ENDOMETRIOSIS  2012  . CESAREAN SECTION N/A 04/12/2017   Procedure: CESAREAN SECTION;  Surgeon: Gae Dry, MD;  Location: [facility] ORS;  Service: Obstetrics;  Laterality: N/A;  . LAPAROSCOPIC OVARIAN CYSTECTOMY Right 2012   Obstetric History: G1P1001  Family History:  Family History  Problem Relation Age of Onset  . Hypertension Mother   . Birth defects Mother        Congential heart condition   . Endometriosis Mother   . Cancer Father        Prostate  . Diabetes Paternal Grandfather   . Stroke Paternal Grandfather     Social History:  Social History   Social History  . Marital status: Married    Spouse name: Miyanna Wiersma - Fiance  . Number of children: N/A   . Years of education: 64   Occupational History  . Second Grade Teacher [employer]   Social History Main Topics  . Smoking status: Never Smoker  . Smokeless tobacco: Never Used  . Alcohol use No  . Drug use: No  . Sexual activity: Yes    Partners: Male    Birth control/ protection: Pill   Other Topics Concern  . Not on file   Social History Narrative   Aleatha was born and reared in [place], [place]. She graduated from [employer] in 2008 and then went to [employer] and obtained her Bachelors in [employer] in 2012. She currently lives at home with her parents. She has a turtle named Crush. 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 She enjoys photography, hanging out with friends. She enjoys action movies. 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      Caffeine- Coffee 1 cup, 1 small can of soda    Allergies:  No Known Allergies  Medications: Prior to Admission medications   Medication Sig Start Date End Date Taking? Authorizing Provider  clindamycin (CLEOCIN) 300 MG capsule Take 1 capsule (300 mg total) by mouth 3 (three) times daily. 04/23/17 05/07/17  [name], MD  ibuprofen (ADVIL,MOTRIN) 600 MG tablet Take  1 tablet (600 mg total) by mouth every 6 (six) hours as needed for mild pain. 04/15/17   [name], MD  oxyCODONE-acetaminophen (PERCOCET/ROXICET) 5-325 MG tablet Take 1-2 tablets by mouth every 4 (four) hours as needed (pain scale > 7). 04/15/17   [name], MD  Prenatal Vit-Fe Fumarate-FA (PRENATAL VITAMIN PO) Take by mouth.    [provider]    Physical Exam Vitals: Last menstrual period 06/29/2016, unknown if currently breastfeeding. General: NAD HEENT: normocephalic, anicteric Pulmonary: No increased work of breathing Breast: Erythema and induration of the right breast, small area of erythema on the left breast see  image below Cardiovascular: RRR, distal pulses 2+ Abdomen: soft, non-tender, non-distended, incision D/C/I Extremities: no edema, erythema, or tenderness Neurologic: Grossly intact Psychiatric: mood appropriate, affect full  Physical Exam  Pulmonary/Chest:       Labs: No results found for this or any previous visit (from the past 72 hour(s)).  Imaging No results found.  Assessment: 26 y.o. G1P1001 with lactational mastitis  Plan: - CBC and CMP - Vancomycin 1g IV q12hrs and pharmacy consult (vanc trough after 4th dose) - Ibuprofen percocet prn for pain - Warm compress - Continue lactational support - [place] right breast to evaluate for abscess, if abscess will obtain general surgery consult to assess need for drainage

## 2017-04-24 NOTE — Telephone Encounter (Signed)
Pt is schedule with CLG

## 2017-04-24 NOTE — Telephone Encounter (Signed)
CLG said she could be worked in this afternoon. Please schedule

## 2017-04-24 NOTE — Telephone Encounter (Signed)
I called and left voicemail for patient to call back to be schedule.

## 2017-04-24 NOTE — Progress Notes (Signed)
Reviewed ultrasound results and labs with patient, no evidence of abscess or drainable fluid collection at this time.  Continue Vancomycin trend WBC

## 2017-04-24 NOTE — Lactation Note (Signed)
Lactation Consultation Note  Patient Name: MOUNA YAGER OZHYQ'M Date: 04/24/2017     Maternal Data  pt pumped left breast with medela Symphony breast pump and kit,  which has approx. 5 cm red area on upper left quadrant x 20 min. And obtained 2.5 oz of mature breast milk, this breast softened well after pumping  warm compress to right breast with mastitis 5 min before pumping, and warm compress to breast with massage while pumping, obtained 4cc skim looking milk, which is more than pt has been pumping, ice pack to right breast, 10 min. on / 20 -30 min. Off until warmth before next pumping.  Encouraged to pump every 2-3 hr tonight  Depending on fullness of breasts, pump at least every 3 hrs.  Feeding    LATCH Score/Interventions                      Lactation Tools Discussed/Used     Consult Status      Ferol Luz 04/24/2017, 6:59 PM

## 2017-04-25 DIAGNOSIS — O9123 Nonpurulent mastitis associated with lactation: Secondary | ICD-10-CM

## 2017-04-25 LAB — CBC
HEMATOCRIT: 24.3 % — AB (ref 35.0–47.0)
HEMOGLOBIN: 8.1 g/dL — AB (ref 12.0–16.0)
MCH: 31.5 pg (ref 26.0–34.0)
MCHC: 33.5 g/dL (ref 32.0–36.0)
MCV: 94 fL (ref 80.0–100.0)
Platelets: 422 10*3/uL (ref 150–440)
RBC: 2.58 MIL/uL — ABNORMAL LOW (ref 3.80–5.20)
RDW: 14.7 % — AB (ref 11.5–14.5)
WBC: 16.9 10*3/uL — ABNORMAL HIGH (ref 3.6–11.0)

## 2017-04-25 NOTE — Progress Notes (Signed)
Patient ID: Heather Conner, female   DOB: 1991/10/02, 26 y.o.   MRN: [id number]  Daily Benign Gynecology Progress Note Heather Conner  [id number]  HD#2  Subjective:  Overnight Events: no acute events Complaints: denies current fevers, chills. Still have some breast pain. Notes a possible improvement in rendness on both breasts She denies: fevers, chills, chest pain, trouble breathing, nausea, vomiting, severe abdominal pain.  She has tolerated: normal diet as tolerated She reports her pain is well controlled with PO pai meds.   She is ambulating and is voiding.   Objective:  Most recent vitals Temp: 98 F (36.7 C)  BP: 118/83  Pulse Rate: 90  Resp: 20  SpO2: 100 %   Vitals Range over 24 hours Temp  Avg: 98.7 F (37.1 C)  Min: 97.5 F (36.4 C)  Max: 101.6 F (38.7 C) BP  Min: 115/64  Max: 127/85 Pulse  Avg: 103.6  Min: 88  Max: 129 SpO2  Avg: 100 %  Min: 100 %  Max: 100 %   Physical Exam Physical Exam  Constitutional: She is oriented to person, place, and time. She appears well-developed and well-nourished. No distress.  HENT:  Head: Normocephalic and atraumatic.  Cardiovascular: Normal rate, regular rhythm and normal heart sounds.   Pulmonary/Chest: Effort normal and breath sounds normal. No respiratory distress.    Abdominal: Soft. She exhibits no distension. There is no tenderness. There is no guarding.  Musculoskeletal: She exhibits no edema or tenderness.  Neurological: She is alert and oriented to person, place, and time. No cranial nerve deficit.  Skin:  See breast exam  Psychiatric: She has a normal mood and affect. Her behavior is normal. Judgment normal.  Vitals reviewed.  Female chaperone present during breast exam  AM Labs Lab Results  Component Value Date   WBC 16.9 (H) 04/25/2017   HGB 8.1 (L) 04/25/2017   HCT 24.3 (L) 04/25/2017   PLT 422 04/25/2017   NA 135 04/24/2017   K 3.6 04/24/2017   CREATININE 0.77 04/24/2017   BUN 13 04/24/2017      Assessment:  Heather Conner is a 26 y.o. female HD#2 admitted with lactation mastitis, starting to improve.    Plan:  Pain Control:  Prn po pain meds ID: vanc with input from pharmacy Nutrition:  Normal diet IVF: 75 ml/hr. May KVO later DVT Prophylaxis:  Ambulation, may need SCDs vs TED hose if not ambulating Activity: prn Anticipated Discharge: once clinically improved and stable.  Not appropriate for discharge at this time.  Prentice Docker, MD  04/25/2017 9:58 AM

## 2017-04-26 DIAGNOSIS — O9123 Nonpurulent mastitis associated with lactation: Secondary | ICD-10-CM

## 2017-04-26 LAB — VANCOMYCIN, TROUGH: VANCOMYCIN TR: 8 ug/mL — AB (ref 15–20)

## 2017-04-26 MED ORDER — VANCOMYCIN HCL 10 G IV SOLR
1250.0000 mg | Freq: Three times a day (TID) | INTRAVENOUS | Status: DC
  Administered 2017-04-27 (×2): 1250 mg via INTRAVENOUS
  Filled 2017-04-26 (×6): qty 1250

## 2017-04-26 NOTE — Progress Notes (Signed)
Pharmacy Antibiotic Note  Heather Conner is a 26 y.o. female admitted on 04/24/2017 with lactational mastitis.  Pharmacy has been consulted for vancomycin dosing.  Plan: Continue MD dosing for Vancomycin 1000 mg  IV every 12 hours.  Goal trough 15-20 mcg/mL. Trough before 4th dose.  7/1:  VT @ 17:50 = 8 mcg/mL  Will increase dose to Vancomycin 1250 mg IV Q8H to start 7/2 @ 0200. Will draw next trough before 3rd new dose on 7/2 @ 17:30.   Ke 0.070, half life 9.9, Vd 40     Temp (24hrs), Avg:98.6 F (37 C), Min:97.8 F (36.6 C), Max:99.4 F (37.4 C)   Recent Labs Lab 04/24/17 1716 04/25/17 0427 04/26/17 1750  WBC 24.0* 16.9*  --   CREATININE 0.77  --   --   VANCOTROUGH  --   --  8*    Estimated Creatinine Clearance: 94.9 mL/min (by C-G formula based on SCr of 0.77 mg/dL).    No Known Allergies  Antimicrobials this admission: Vancomycin 6/29 >>  Dose adjustments this admission: 7/1 AM vanc level 60 per lab. Apparently drawn while dose was infusing. Will redraw this PM for more accurate level.  Lab requested to cancel AM level.   Microbiology results:   Thank you for allowing pharmacy to be a part of this patient's care.  Elsi Stelzer D 04/26/2017 6:49 PM

## 2017-04-26 NOTE — Progress Notes (Signed)
Pharmacy Antibiotic Note  Heather Conner is a 26 y.o. female admitted on 04/24/2017 with lactational mastitis.  Pharmacy has been consulted for vancomycin dosing.  Plan: Continue MD dosing for Vancomycin 1000 mg  IV every 12 hours.  Goal trough 15-20 mcg/mL. Trough before 4th dose.  Ke 0.070, half life 9.9, Vd 40     Temp (24hrs), Avg:98 F (36.7 C), Min:97.7 F (36.5 C), Max:98.7 F (37.1 C)   Recent Labs Lab 04/24/17 1716 04/25/17 0427  WBC 24.0* 16.9*  CREATININE 0.77  --     Estimated Creatinine Clearance: 94.9 mL/min (by C-G formula based on SCr of 0.77 mg/dL).    No Known Allergies  Antimicrobials this admission: Vancomycin 6/29 >>  Dose adjustments this admission: 7/1 AM vanc level 60 per lab. Apparently drawn while dose was infusing. Will redraw this PM for more accurate level.  Lab requested to cancel AM level.   Microbiology results:   Thank you for allowing pharmacy to be a part of this patient's care.  Simi Briel S 04/26/2017 6:34 AM

## 2017-04-26 NOTE — Progress Notes (Signed)
Patient ID: Heather Conner, female   DOB: [birth year], 26 y.o.   MRN: [id number]  Daily Benign Gynecology Progress Note Heather Conner  [id number]  HD#3  Subjective:  Overnight Events: more breast pain Complaints: denies current fevers, chills. Still have some breast pain, but improved since overnight.  She denies: fevers, chills, chest pain, trouble breathing, nausea, vomiting, severe abdominal pain.  She has tolerated: normal diet as tolerated She reports her pain is well controlled with PO pain meds.   She is ambulating and is voiding.   Objective:  Most recent vitals Temp: 99.4 F (37.4 C)  BP: 135/89  Pulse Rate: 88  Resp: 18  SpO2: 99 %   Vitals Range over 24 hours Temp  Avg: 98.3 F (36.8 C)  Min: 97.7 F (36.5 C)  Max: 99.4 F (37.4 C) BP  Min: 113/85  Max: 135/89 Pulse  Avg: 87.8  Min: 79  Max: 97 SpO2  Avg: 99.3 %  Min: 99 %  Max: 100 %   Physical Exam Physical Exam  Constitutional: She is oriented to person, place, and time. She appears well-developed and well-nourished. No distress.  HENT:  Head: Normocephalic and atraumatic.  Cardiovascular: Normal rate, regular rhythm and normal heart sounds.   Pulmonary/Chest: Effort normal and breath sounds normal. No respiratory distress.    Abdominal: Soft. She exhibits no distension. There is no tenderness. There is no guarding.  Musculoskeletal: She exhibits no edema or tenderness.  Neurological: She is alert and oriented to person, place, and time. No cranial nerve deficit.  Skin:  See breast exam  Psychiatric: She has a normal mood and affect. Her behavior is normal. Judgment normal.  Vitals reviewed.  Female chaperone present during breast exam  AM Labs Lab Results  Component Value Date   WBC 16.9 (H) 04/25/2017   HGB 8.1 (L) 04/25/2017   HCT 24.3 (L) 04/25/2017   PLT 422 04/25/2017   NA 135 04/24/2017   K 3.6 04/24/2017   CREATININE 0.77 04/24/2017   BUN 13 04/24/2017     Assessment:  Heather Conner is a 26  y.o. female HD#3 admitted with lactation mastitis, slow improvement.    Plan:  Pain Control:  Prn po pain meds ID: vanc with input from pharmacy.  Trough today. Adjustments per pharmacy. Will need to consider home PO abx, as need to cover for MRSA and did not improve with clindamycin as outpatient. Clindamycin may be considered again as she is improving and is not a candidate for TMP-SMX due to being within one month of delivery and is breastfeeding Nutrition:  Normal diet IVF: KVO  DVT Prophylaxis:  Ambulation, may need SCDs vs TED hose if not ambulating Activity: prn Anticipated Discharge: once clinically improved and stable.  Not appropriate for discharge at this time.  Prentice Docker, MD  04/26/2017 9:59 AM

## 2017-04-27 DIAGNOSIS — O9122 Nonpurulent mastitis associated with the puerperium: Secondary | ICD-10-CM | POA: Diagnosis not present

## 2017-04-27 LAB — VANCOMYCIN, TROUGH: Vancomycin Tr: 26 ug/mL (ref 15–20)

## 2017-04-27 MED ORDER — VANCOMYCIN HCL 10 G IV SOLR
1250.0000 mg | Freq: Two times a day (BID) | INTRAVENOUS | Status: DC
  Administered 2017-04-27 – 2017-04-28 (×3): 1250 mg via INTRAVENOUS
  Filled 2017-04-27 (×4): qty 1250

## 2017-04-27 NOTE — Discharge Instructions (Signed)
Mastitis Mastitis is inflammation of the breast tissue. It occurs most often in women who are breastfeeding, but it can also affect other women, and even sometimes men. What are the causes? Mastitis is usually caused by a bacterial infection. Bacteria enter the breast tissue through cuts or openings in the skin. Typically, this occurs with breastfeeding because of cracked or irritated skin. Sometimes, it can occur even when there is no opening in the skin. It can be associated with plugged milk (lactiferous) ducts. Nipple piercing can also lead to mastitis. Also, some forms of breast cancer can cause mastitis. What are the signs or symptoms?  Swelling, redness, tenderness, and pain in an area of the breast.  Swelling of the glands under the arm on the same side.  Fever. If an infection is allowed to progress, a collection of pus (abscess) may develop. How is this diagnosed? Your health care provider can usually diagnose mastitis based on your symptoms and a physical exam. Tests may be done to help confirm the diagnosis. These may include:  Removal of pus from the breast by applying pressure to the area. This pus can be examined in the lab to determine which bacteria are present. If an abscess has developed, the fluid in the abscess can be removed with a needle. This can also be used to confirm the diagnosis and determine the bacteria present. In most cases, pus will not be present.  Blood tests to determine if your body is fighting a bacterial infection.  Mammogram or ultrasound tests to rule out other problems or diseases.  How is this treated? Antibiotic medicine is used to treat a bacterial infection. Your health care provider will determine which bacteria are most likely causing the infection and will select an appropriate antibiotic. This is sometimes changed based on the results of tests performed to identify the bacteria, or if there is no response to the antibiotic selected. Antibiotics  are usually given by mouth. You may also be given medicine for pain. Mastitis that occurs with breastfeeding will sometimes go away on its own, so your health care provider may choose to wait 24 hours after first seeing you to decide whether a prescription medicine is needed. Follow these instructions at home:  Only take over-the-counter or prescription medicines for pain, fever, or discomfort as directed by your health care provider.  If your health care provider prescribed an antibiotic, take the medicine as directed. Make sure you finish it even if you start to feel better.  Do not wear a tight or underwire bra. Wear a soft, supportive bra.  Increase your fluid intake, especially if you have a fever.  Women who are breastfeeding should follow these instructions: ? Continue to empty the breast. Your health care provider can tell you whether this milk is safe for your infant or needs to be thrown out. You may be told to stop nursing until your health care provider thinks it is safe for your baby. Use a breast pump if you are advised to stop nursing. ? Keep your nipples clean and dry. ? Empty the first breast completely before going to the other breast. If your baby is not emptying your breasts completely for some reason, use a breast pump to empty your breasts. ? If you go back to work, pump your breasts while at work to stay in time with your nursing schedule. ? Avoid allowing your breasts to become overly filled with milk (engorged). Contact a health care provider if:  You have  pus-like discharge from the breast.  Your symptoms do not improve with the treatment prescribed by your health care provider within 2 days. Get help right away if:  Your pain and swelling are getting worse.  You have pain that is not controlled with medicine.  You have a red line extending from the breast toward your armpit.  You have a fever or persistent symptoms for more than 2-3 days.  You have a fever  and your symptoms suddenly get worse. This information is not intended to replace advice given to you by your health care provider. Make sure you discuss any questions you have with your health care provider. Document Released: 10/13/2005 Document Revised: 03/20/2016 Document Reviewed: 05/13/2013 Elsevier Interactive Patient Education  2017 [place].   Breastfeeding and Mastitis Mastitis is inflammation of the breast tissue. It can occur in women who are breastfeeding. This can make breastfeeding painful. Mastitis will sometimes go away on its own, especially if it is not caused by an infection (non-infectious mastitis). Your health care provider will help determine if medical treatment is needed. Treatment may be needed if the condition is caused by a bacterial infection (infectious mastitis). What are the causes? This condition is often associated with a blocked milkduct, which can happen when too much milk builds up in the breast. Causes of excess milk in the breast can include:  Poor latch-on. If your baby is not latched onto the breast properly, he or she may not empty your breast completely while breastfeeding.  Allowing too much time to pass between feedings.  Wearing a bra or other clothing that is too tight. This puts extra pressure on the milk ducts so milk does not flow through them as it should.  Milk remaining in the breast because it is overfilled (engorged).  Stress and fatigue.  Mastitis can also be caused by a bacterial infection. Bacteria may enter the breast tissue through cuts, cracks, or openings in the skin near the nipple area. Cracks in the skin are often caused when your baby does not latch on properly to the breast. What are the signs or symptoms? Symptoms of this condition include:  Swelling, redness, tenderness, and pain in an area of the breast. This usually affects the upper part of the breast, toward the armpit region. In most cases, it affects only one  breast. In some cases, it may occur on both breasts at the same time and affect a larger portion of breast tissue.  Swelling of the glands under the arm on the same side.  Fatigue, headache, and flu-like muscle aches.  Fever.  Rapid pulse.  Symptoms usually last 2 to 5 days. Breast pain and redness are at their worst on day 2 and day 3, and they usually go away by day 5. If an infection is left to progress, a collection of pus (abscess) may develop. How is this diagnosed? This condition can be diagnosed based on your symptoms and a physical exam. You may also have tests, such as:  Blood tests to determine if your body is fighting a bacterial infection.  Mammogram or ultrasound tests to rule out other problems or diseases.  Fluid tests. If an abscess has developed, the fluid in the abscess may be removed with a needle. The fluid may be analyzed to determine if bacteria are present.  Breast milk may be cultured and tested for bacteria.  How is this treated? This condition will sometimes go away on its own. Your health care provider may  choose to wait 24 hours after first seeing you to decide whether treatment is needed. If treatment is needed, it may include:  Strategies to manage breastfeeding. This includes continuing to breastfeed or pump in order to allow adequate milk flow, using breast massage, and applying heat or cold to the affected area.  Self-care such as rest and increased fluid intake.  Medicine for pain.  Antibiotic medicine to treat a bacterial infection. This is usually taken by mouth.  If an abscess has developed, it may be treated by removing fluid with a needle.  Follow these instructions at home: Medicines  Take over-the-counter and prescription medicines only as told by your health care provider.  If you were prescribed an antibiotic medicine, take it as told by your health care provider. Do not stop taking the antibiotic even if you start to feel  better. General instructions  Do not wear a tight or underwire bra. Wear a soft, supportive bra.  Increase your fluid intake, especially if you have a fever.  Get plenty of rest. For breastfeeding:  Continue to empty your breasts as often as possible, either by breastfeeding or using an electric breast pump. This will lower the pressure and the pain that comes with it. Ask your health care provider if changes need to be made to your breastfeeding or pumping routine.  Keep your nipples clean and dry.  During breastfeeding, empty the first breast completely before going to the other breast. If your baby is not emptying your breasts completely, use a breast pump to empty your breasts.  Use breast massage during feeding or pumping sessions.  If directed, apply moist heat to the affected area of your breast right before breastfeeding or pumping. Use the heat source that your health care provider recommends.  If directed, put ice on the affected area of your breast right after breastfeeding or pumping: ? Put ice in a plastic bag. ? Place a towel between your skin and the bag. ? Leave the ice on for 20 minutes.  If you go back to work, pump your breasts while at work to stay in time with your nursing schedule.  Do not allow your breasts to become engorged. Contact a health care provider if:  You have pus-like discharge from the breast.  You have a fever.  Your symptoms do not improve within 2 days of starting treatment.  Your symptoms return after you have recovered from a breast infection. Get help right away if:  Your pain and swelling are getting worse.  You have pain that is not controlled with medicine.  You have a red line extending from the breast toward your armpit. Summary  Mastitis is inflammation of the breast tissue. It is often caused by a blocked milk duct or bacteria.  This condition may be treated with hot and cold compresses, medicines, self-care, and certain  breastfeeding strategies.  If you were prescribed an antibiotic medicine, take it as told by your health care provider. Do not stop taking the antibiotic even if you start to feel better.  Continue to empty your breasts as often as possible either by breastfeeding or using an electric breast pump. This information is not intended to replace advice given to you by your health care provider. Make sure you discuss any questions you have with your health care provider. Document Released: 02/07/2005 Document Revised: 10/14/2016 Document Reviewed: 10/14/2016 Elsevier Interactive Patient Education  2017 [employer].

## 2017-04-27 NOTE — Progress Notes (Signed)
Patient ID: MABLE DARA, female   DOB: 1991-07-01, 26 y.o.   MRN: [id number]  Daily Benign Gynecology Progress Note CHETARA KROPP  [id number]  HD#4 Subjective:  Overnight Events: improved breast pain and redness Complaints: denies current fevers, chills. Still have some breast pain, but improved since overnight.  She denies: fevers, chills, chest pain, trouble breathing, nausea, vomiting, severe abdominal pain.  She has tolerated: normal diet as tolerated She reports her pain is well controlled with PO pain meds.   She is ambulating and is voiding.   Objective:  Most recent vitals Temp: 97.7 F (36.5 C)  BP: 127/88  Pulse Rate: 72  Resp: 18  SpO2: 100 %   Vitals Range over 24 hours Temp  Avg: 98.5 F (36.9 C)  Min: 97.7 F (36.5 C)  Max: 99.4 F (37.4 C) BP  Min: 117/87  Max: 142/96 Pulse  Avg: 82  Min: 72  Max: 88 SpO2  Avg: 99.5 %  Min: 99 %  Max: 100 %   Physical Exam Physical Exam  Constitutional: She is oriented to person, place, and time. She appears well-developed and well-nourished. No distress.  Cardiovascular: Normal rate, regular rhythm, normal heart sounds and normal pulses.  Exam reveals no gallop and no friction rub.   No murmur heard. Pulmonary/Chest: Effort normal and breath sounds normal. She exhibits no mass, no tenderness and no edema. Right breast exhibits no inverted nipple. Left breast exhibits no inverted nipple and no mass.  RIGHT BREAST- still some receding erythema, slightly firm area 1 o'clock LEFT BREAST- min erythema, no mass  Abdominal: Normal appearance.  Musculoskeletal: Normal range of motion.  Lymphadenopathy:    She has no axillary adenopathy.       Right axillary: No pectoral and no lateral adenopathy present.       Left axillary: No pectoral and no lateral adenopathy present. Neurological: She is alert and oriented to person, place, and time.  Skin: Skin is warm and dry. No abrasion, no bruising, no lesion and no rash noted. No erythema.   Psychiatric: She has a normal mood and affect. Her speech is normal and behavior is normal. Judgment normal.  Vitals reviewed.   Assessment:  HALAH WHITESIDE is a 26 y.o. female HD#4 admitted with lactation mastitis, slow improvement.    Plan:  Pain Control:  Prn po pain meds ID: vanc with input from pharmacy.  Trough again today as was low yesterday. Adjustments per pharmacy.  Plan to cont Vanc at therapeutic level today and consider d/c this evening or tomorrow based on levels. Will need to consider home PO abx, as need to cover for MRSA and did not improve with clindamycin as outpatient. Clindamycin may be considered again as she is improving and is not a candidate for TMP-SMX due to being within one month of delivery and is breastfeeding Nutrition:  Normal diet IVF: KVO  DVT Prophylaxis:  Ambulation, may need SCDs vs TED hose if not ambulating Activity: prn Anticipated Discharge: once clinically improved and stable.  Not appropriate for discharge at this time.  [name], MD  04/27/2017 7:40 AM

## 2017-04-27 NOTE — Progress Notes (Signed)
Pharmacy Antibiotic Note  Heather Conner is a 26 y.o. female admitted on 04/24/2017 with lactational mastitis.  Pharmacy has been consulted for vancomycin dosing.  Plan: Vancomycin level resulted @ 26 (~7 hour post dose on q8 hour regimen). Will extend interval to q12 hour dosing and will start Vancomycin @ 23:00. Will follow renal function and will recheck trough level in ~ 4 to 5 doses.    Temp (24hrs), Avg:98 F (36.7 C), Min:97.7 F (36.5 C), Max:98.4 F (36.9 C)   Recent Labs Lab 04/24/17 1716 04/25/17 0427 04/26/17 1750 04/27/17 1722  WBC 24.0* 16.9*  --   --   CREATININE 0.77  --   --   --   VANCOTROUGH  --   --  8* 26*    Estimated Creatinine Clearance: 94.9 mL/min (by C-G formula based on SCr of 0.77 mg/dL).    No Known Allergies  Antimicrobials this admission: Vancomycin 6/29 >>  Dose adjustments this admission: 7/1 AM vanc level 60 per lab. Apparently drawn while dose was infusing. Will redraw this PM for more accurate level.  Lab requested to cancel AM level.   Microbiology results:   Thank you for allowing pharmacy to be a part of this patient's care.  Alesia Oshields D 04/27/2017 6:21 PM

## 2017-04-28 ENCOUNTER — Inpatient Hospital Stay: Payer: BC Managed Care – PPO

## 2017-04-28 DIAGNOSIS — O9122 Nonpurulent mastitis associated with the puerperium: Secondary | ICD-10-CM

## 2017-04-28 LAB — CREATININE, SERUM
CREATININE: 0.69 mg/dL (ref 0.44–1.00)
GFR calc Af Amer: >60 mL/min (ref >60)
GFR calc non Af Amer: >60 mL/min (ref >60)

## 2017-04-28 LAB — CBC
HCT: 25.3 % — ABNORMAL LOW (ref 35.0–47.0)
Hemoglobin: 8.5 g/dL — ABNORMAL LOW (ref 12.0–16.0)
MCH: 31.4 pg (ref 26.0–34.0)
MCHC: 33.7 g/dL (ref 32.0–36.0)
MCV: 93.3 fL (ref 80.0–100.0)
Platelets: 552 10*3/uL — ABNORMAL HIGH (ref 150–440)
RBC: 2.71 MIL/uL — ABNORMAL LOW (ref 3.80–5.20)
RDW: 14.2 % (ref 11.5–14.5)
WBC: 11.8 10*3/uL — AB (ref 3.6–11.0)

## 2017-04-28 LAB — BUN: BUN: 14 mg/dL (ref 6–20)

## 2017-04-28 NOTE — Progress Notes (Signed)
Obstetric and Gynecology  Subjective  Doing better since admission.  Pain improved, no fevers, but still discomfort in right breast  Objective   Vitals:   04/28/17 0207 04/28/17 0721  BP: 119/79 118/77  Pulse: 74 82  Resp: 18 16  Temp: 98.5 F (36.9 C) 98.3 F (36.8 C)     Intake/Output Summary (Last 24 hours) at 04/28/17 1045 Last data filed at 04/28/17 0025  Gross per 24 hour  Intake              250 ml  Output                0 ml  Net              250 ml    General: NAD Oulmonary: no increased work of breathing Breast: Erythema receeding on right breast, left breast minimal residual erythema.  Continued induration on the right breast Extremities: no edema  Labs: Results for orders placed or performed during the hospital encounter of 04/24/17 (from the past 24 hour(s))  Vancomycin, trough     Status: Abnormal   Collection Time: 04/27/17  5:22 PM  Result Value Ref Range   Vancomycin Tr 26 (HH) 15 - 20 ug/mL  Creatinine, serum     Status: None   Collection Time: 04/28/17  5:48 AM  Result Value Ref Range   Creatinine, Ser 0.69 0.44 - 1.00 mg/dL   GFR calc non Af Amer >60 >60 mL/min   GFR calc Af Amer >60 >60 mL/min  BUN     Status: None   Collection Time: 04/28/17  5:48 AM  Result Value Ref Range   BUN 14 6 - 20 mg/dL  CBC     Status: Abnormal   Collection Time: 04/28/17  5:48 AM  Result Value Ref Range   WBC 11.8 (H) 3.6 - 11.0 K/uL   RBC 2.71 (L) 3.80 - 5.20 MIL/uL   Hemoglobin 8.5 (L) 12.0 - 16.0 g/dL   HCT 25.3 (L) 35.0 - 47.0 %   MCV 93.3 80.0 - 100.0 fL   MCH 31.4 26.0 - 34.0 pg   MCHC 33.7 32.0 - 36.0 g/dL   RDW 14.2 11.5 - 14.5 %   Platelets 552 (H) 150 - 440 K/uL    Cultures: Results for orders placed or performed in visit on 03/09/17  Strep Gp B NAA     Status: None   Collection Time: 03/09/17  3:33 PM  Result Value Ref Range Status   Strep Gp B NAA Negative Negative Final    Comment: Centers for Disease Control and Prevention (CDC) and  American Congress of Obstetricians and Gynecologists (ACOG) guidelines for prevention of perinatal group B streptococcal (GBS) disease specify co-collection of a vaginal and rectal swab specimen to maximize sensitivity of GBS detection. Per the CDC and ACOG, swabbing both the lower vagina and rectum substantially increases the yield of detection compared with sampling the vagina alone. Penicillin G, ampicillin, or cefazolin are indicated for intrapartum prophylaxis of perinatal GBS colonization. Reflex susceptibility testing should be performed prior to use of clindamycin only on GBS isolates from penicillin-allergic women who are considered a high risk for anaphylaxis. Treatment with vancomycin without additional testing is warranted if resistance to clindamycin is noted.     Imaging:  Assessment   26 y.o. G1P1001 with lactational mastitis on Vancomycin after failing outpatient treatment HD5  Plan   - check repeat US - WBC count trending down, erythema retreating, area in  right breast with continued induration will eval for abscess formation - continue vancomycin

## 2017-04-29 DIAGNOSIS — O9122 Nonpurulent mastitis associated with the puerperium: Secondary | ICD-10-CM

## 2017-04-29 LAB — BASIC METABOLIC PANEL
Anion gap: 8 (ref 5–15)
BUN: 15 mg/dL (ref 6–20)
CALCIUM: 9.5 mg/dL (ref 8.9–10.3)
CO2: 28 mmol/L (ref 22–32)
CREATININE: 0.64 mg/dL (ref 0.44–1.00)
Chloride: 103 mmol/L (ref 101–111)
Glucose, Bld: 95 mg/dL (ref 65–99)
Potassium: 4.4 mmol/L (ref 3.5–5.1)
Sodium: 139 mmol/L (ref 135–145)

## 2017-04-29 MED ORDER — CLINDAMYCIN HCL 150 MG PO CAPS
300.0000 mg | ORAL_CAPSULE | Freq: Three times a day (TID) | ORAL | Status: DC
  Administered 2017-04-29 – 2017-04-30 (×4): 300 mg via ORAL
  Filled 2017-04-29: qty 2
  Filled 2017-04-29: qty 1
  Filled 2017-04-29: qty 2
  Filled 2017-04-29: qty 1
  Filled 2017-04-29: qty 2

## 2017-04-29 MED ORDER — DOCUSATE SODIUM 100 MG PO CAPS
100.0000 mg | ORAL_CAPSULE | Freq: Two times a day (BID) | ORAL | Status: DC | PRN
  Administered 2017-04-29: 100 mg via ORAL
  Filled 2017-04-29: qty 1

## 2017-04-29 NOTE — Progress Notes (Signed)
Pt is asking if she can leave the floor and go outside the cafeteria to eat at the tables with her parents at dinnertime. Rod Can, CNM called and she states that it would be ok for pt to go outside at dinner to eat with her family. Pt made aware.

## 2017-04-29 NOTE — Progress Notes (Addendum)
Obstetric and Gynecology  Subjective  No fevers, no chills.  Continued improvement in symptoms  Objective   Vitals:   04/28/17 2000 04/28/17 2322  BP: 127/86 133/88  Pulse: 93 69  Resp: 16 16  Temp: 97.8 F (36.6 C) 98.4 F (36.9 C)    No intake or output data in the 24 hours ending 04/29/17 0738  General: NAD Pulmonary: no increased work of breathing Breast: Left breast erythema completely resolved, right breast still some induration and erythema about 6cm from nipple at 12 O'Clock see image below Extremities: no edema  Physical Exam  Pulmonary/Chest:       Labs: No results found for this or any previous visit (from the past 24 hour(s)).  Cultures: Results for orders placed or performed in visit on 03/09/17  Strep Gp B NAA     Status: None   Collection Time: 03/09/17  3:33 PM  Result Value Ref Range Status   Strep Gp B NAA Negative Negative Final    Comment: Centers for Disease Control and Prevention (CDC) and American Congress of Obstetricians and Gynecologists (ACOG) guidelines for prevention of perinatal group B streptococcal (GBS) disease specify co-collection of a vaginal and rectal swab specimen to maximize sensitivity of GBS detection. Per the CDC and ACOG, swabbing both the lower vagina and rectum substantially increases the yield of detection compared with sampling the vagina alone. Penicillin G, ampicillin, or cefazolin are indicated for intrapartum prophylaxis of perinatal GBS colonization. Reflex susceptibility testing should be performed prior to use of clindamycin only on GBS isolates from penicillin-allergic women who are considered a high risk for anaphylaxis. Treatment with vancomycin without additional testing is warranted if resistance to clindamycin is noted.     Imaging:  Assessment   26 y.o. G1P1001 HD6 lactational mastitis  Plan   - remains afebrile, still some induration and mild erythema right breast much improved.  Repeat US  yesterday still does not show any abscess collection.  WBC normalized.   - switch to clindamycin po which will be her outpatient regimen - repeat CBC in AM if stable with switch to po discharge home tomorrow

## 2017-04-30 LAB — CBC
HEMATOCRIT: 29.3 % — AB (ref 35.0–47.0)
HEMOGLOBIN: 10.1 g/dL — AB (ref 12.0–16.0)
MCH: 32 pg (ref 26.0–34.0)
MCHC: 34.4 g/dL (ref 32.0–36.0)
MCV: 93.1 fL (ref 80.0–100.0)
Platelets: 671 10*3/uL — ABNORMAL HIGH (ref 150–440)
RBC: 3.15 MIL/uL — AB (ref 3.80–5.20)
RDW: 14.1 % (ref 11.5–14.5)
WBC: 9.1 10*3/uL (ref 3.6–11.0)

## 2017-04-30 NOTE — Discharge Summary (Signed)
Physician Discharge Summary  Patient ID: Heather Conner MRN: [id number] DOB/AGE: 04-02-1991 26 y.o.  Admit date: 04/24/2017 Discharge date: 04/30/2017  Admission Diagnoses: Mastitis  Discharge Diagnoses:  Active Problems:   Mastitis   Mastitis during puerperium  Discharged Condition: good  Hospital Course: Pt seen and examined following post partum discharge with worsening fever and right breast erythema and swelling, and after outpatient management with antibiotics without improvement (2 ABX tried), she was admitted for IV ABX management.  She was placed on Vancomycin, due to concern for ABX resistance.  After several days she has now been without fever and has shown improvement in erythema and swelling (some still present), and will be discharged on Clindamycin po therapy.  Continues to Breast Pump and feed baby thru bottle.    Consults: Lactation  Significant Diagnostic Studies: Breast Ultrasound- Findings compatible with mastitis. No fluid collections identified to suggest abscess formation. WBC trending down.  Treatments: antibiotics: vancomycin  Discharge Exam: Blood pressure 120/78, pulse 74, temperature 98 F (36.7 C), temperature source Oral, resp. rate 18, height 5\' 1"  (1.549 m), weight 158 lb (71.7 kg), SpO2 97 %, unknown if currently breastfeeding. General appearance: alert, cooperative and no distress Chest wall: no tenderness Lungs: CTA B Heart: rrr, no m/g/r Breasts: RIGHT breast with min and improving erythema in UOQ, palpable induration in this area, no peu d'orange, LEFT breast normal appearance and palpation GI: soft, non-tender; bowel sounds normal; no masses,  no organomegaly Extremities: extremities normal, atraumatic, no cyanosis or edema Skin: Skin color, texture, turgor normal. No rashes or lesions  Disposition: 01-Home or Self Care  Discharge Instructions    Call MD for:  persistant dizziness or light-headedness    Complete by:  As directed    Call MD  for:  severe uncontrolled pain    Complete by:  As directed    Call MD for:  temperature >100.4    Complete by:  As directed    Diet general    Complete by:  As directed    Increase activity slowly    Complete by:  As directed    May shower / Bathe    Complete by:  As directed    Sexual Activity Restrictions    Complete by:  As directed    No sex for 6 weeks postpartum     Allergies as of 04/30/2017   No Known Allergies     Medication List    TAKE these medications   clindamycin 300 MG capsule Commonly known as:  CLEOCIN Take 1 capsule (300 mg total) by mouth 3 (three) times daily.   ibuprofen 600 MG tablet Commonly known as:  ADVIL,MOTRIN Take 1 tablet (600 mg total) by mouth every 6 (six) hours as needed for mild pain.   oxyCODONE-acetaminophen 5-325 MG tablet Commonly known as:  PERCOCET/ROXICET Take 1-2 tablets by mouth every 4 (four) hours as needed (pain scale > 7).   PRENATAL VITAMIN PO Take by mouth.      Follow-up Information    Gae Dry, MD. Schedule an appointment as soon as possible for a visit.   Specialty:  Obstetrics and Gynecology Why:  Appt Monday Contact information: [address] [place] [address] [phone]          Signed: [name] 04/30/2017, 9:45 AM

## 2017-04-30 NOTE — Progress Notes (Signed)
Patient discharged home with significant other. Discharge instructions, prescriptions and follow up appointment given to and reviewed with patient and significant other. Patient verbalized understanding. Escorted out via wheelchair by auxiliary.  

## 2017-05-04 ENCOUNTER — Encounter: Payer: Self-pay | Admitting: Obstetrics & Gynecology

## 2017-05-04 ENCOUNTER — Ambulatory Visit (INDEPENDENT_AMBULATORY_CARE_PROVIDER_SITE_OTHER): Payer: BC Managed Care – PPO | Admitting: Obstetrics & Gynecology

## 2017-05-04 VITALS — BP 110/70 | HR 110 | Ht 61.0 in | Wt 152.0 lb

## 2017-05-04 DIAGNOSIS — O9122 Nonpurulent mastitis associated with the puerperium: Secondary | ICD-10-CM | POA: Diagnosis not present

## 2017-05-04 MED ORDER — METOCLOPRAMIDE HCL 10 MG PO TABS: 10.0000 mg | ORAL_TABLET | Freq: Four times a day (QID) | ORAL | 2 refills | Status: DC

## 2017-05-04 NOTE — Progress Notes (Signed)
  HPI:      [name] is a 26 y.o. G1P1001 who is postpartum, presents today for a problem visit.  She complains of improving redness and swelling and pain due to recently dx/tx mastitis (see hospital notes) on the rightside which she first noticed a few days ago.  It has improved w hospital IV Vancomycin followed by outpatient mgt w Clindamycin.  Associated symptoms include fever prior to hospitalization, no recent fevers.  PMHx: She  has a past medical history of Anemia; Eczema; Endometriosis; History of ovarian cyst; and Migraine. Also,  has a past surgical history that includes Laparoscopic ovarian cystectomy (Right, 2012); Ablation on endometriosis (2012); and Cesarean section (N/A, 04/12/2017)., family history includes Birth defects in her mother; Cancer in her father; Diabetes in her paternal grandfather; Endometriosis in her mother; Hypertension in her mother; Stroke in her paternal grandfather.,  reports that she has never smoked. She has never used smokeless tobacco. She reports that she does not drink alcohol or use drugs.  She has a current medication list which includes the following prescription(s): clindamycin, ibuprofen, metoclopramide, oxycodone-acetaminophen, and prenatal vit-fe fumarate-fa, and the following Facility-Administered Medications: triamcinolone cream. Also, has No Known Allergies.  Review of Systems  All other systems reviewed and are negative.  Objective: BP 110/70   Pulse (!) 110   Ht 5\' 1"  (1.549 m)   Wt 152 lb (68.9 kg)   BMI 28.72 kg/m  Physical Exam  Constitutional: She is oriented to person, place, and time. She appears well-developed and well-nourished. No distress.  Cardiovascular: Normal rate, regular rhythm, normal heart sounds and normal pulses.  Exam reveals no gallop and no friction rub.   No murmur heard. Pulmonary/Chest: Effort normal and breath sounds normal. She exhibits no mass, no tenderness and no edema. Right breast exhibits no  inverted nipple, no mass, no nipple discharge, no skin change and no tenderness. Left breast exhibits no inverted nipple, no mass, no nipple discharge, no skin change and no tenderness. There is no breast swelling.  Still palpable firm area in UIQ with light skin erythema (improved from last exam).  Min T.  Not warm to touch.  Abdominal: Normal appearance.  Musculoskeletal: Normal range of motion.  Lymphadenopathy:    She has no axillary adenopathy.       Right axillary: No pectoral and no lateral adenopathy present.       Left axillary: No pectoral and no lateral adenopathy present. Neurological: She is alert and oriented to person, place, and time.  Skin: Skin is warm and dry. No abrasion, no bruising, no lesion and no rash noted. No erythema.  Psychiatric: She has a normal mood and affect. Her speech is normal and behavior is normal. Judgment normal.  Vitals reviewed.   ASSESSMENT/PLAN: mastitis 1. Mastitis associated with childbirth, delivered Resolving Finish ABX soon Reglan for better milk production as this has slowed  Heather Applebaum, MD, [facility], [facility] 05/04/2017  1:57 PM

## 2017-05-04 NOTE — Patient Instructions (Signed)
Metoclopramide tablets What is this medicine? METOCLOPRAMIDE (met oh kloe PRA mide) is used to treat the symptoms of gastroesophageal reflux disease (GERD) like heartburn. It is also used to treat people with slow emptying of the stomach and intestinal tract, as well as to help with breast milk production.. This medicine may be used for other purposes; ask your health care provider or pharmacist if you have questions. COMMON BRAND NAME(S): Reglan What should I tell my health care provider before I take this medicine? They need to know if you have any of these conditions: -breast cancer -depression -diabetes -heart failure -high blood pressure -kidney disease -liver disease -Parkinson's disease or a movement disorder -pheochromocytoma -seizures -stomach obstruction, bleeding, or perforation -an unusual or allergic reaction to metoclopramide, procainamide, sulfites, other medicines, foods, dyes, or preservatives -pregnant or trying to get pregnant -breast-feeding How should I use this medicine? Take this medicine by mouth with a glass of water. Follow the directions on the prescription label. Take this medicine on an empty stomach, about 30 minutes before eating. Take your doses at regular intervals. Do not take your medicine more often than directed. Do not stop taking except on the advice of your doctor or health care professional. A special MedGuide will be given to you by the pharmacist with each prescription and refill. Be sure to read this information carefully each time. Talk to your pediatrician regarding the use of this medicine in children. Special care may be needed. Overdosage: If you think you have taken too much of this medicine contact a poison control center or emergency room at once. NOTE: This medicine is only for you. Do not share this medicine with others. What if I miss a dose? If you miss a dose, take it as soon as you can. If it is almost time for your next dose, take  only that dose. Do not take double or extra doses. What may interact with this medicine? -acetaminophen -cyclosporine -digoxin -medicines for blood pressure -medicines for diabetes, including insulin -medicines for hay fever and other allergies -medicines for depression, especially a Monoamine Oxidase Inhibitor (MAOI) -medicines for Parkinson's disease, like levodopa -medicines for sleep or for pain -quinidine -tetracycline This list may not describe all possible interactions. Give your health care provider a list of all the medicines, herbs, non-prescription drugs, or dietary supplements you use. Also tell them if you smoke, drink alcohol, or use illegal drugs. Some items may interact with your medicine. What should I watch for while using this medicine? It may take a few weeks for your stomach condition to start to get better. However, do not take this medicine for longer than 12 weeks. The longer you take this medicine, and the more you take it, the greater your chances are of developing serious side effects. If you are an elderly patient, a female patient, or you have diabetes, you may be at an increased risk for side effects from this medicine. Contact your doctor immediately if you start having movements you cannot control such as lip smacking, rapid movements of the tongue, involuntary or uncontrollable movements of the eyes, head, arms and legs, or muscle twitches and spasms. Patients and their families should watch out for worsening depression or thoughts of suicide. Also watch out for any sudden or severe changes in feelings such as feeling anxious, agitated, panicky, irritable, hostile, aggressive, impulsive, severely restless, overly excited and hyperactive, or not being able to sleep. If this happens, especially at the beginning of treatment or after a  change in dose, call your doctor. Do not treat yourself for high fever. Ask your doctor or health care professional for advice. You may  get drowsy or dizzy. Do not drive, use machinery, or do anything that needs mental alertness until you know how this drug affects you. Do not stand or sit up quickly, especially if you are an older patient. This reduces the risk of dizzy or fainting spells. Alcohol can make you more drowsy and dizzy. Avoid alcoholic drinks. What side effects may I notice from receiving this medicine? Side effects that you should report to your doctor or health care professional as soon as possible: -allergic reactions like skin rash, itching or hives, swelling of the face, lips, or tongue -abnormal production of milk in females -breast enlargement in both males and females -change in the way you walk -difficulty moving, speaking or swallowing -drooling, lip smacking, or rapid movements of the tongue -excessive sweating -fever -involuntary or uncontrollable movements of the eyes, head, arms and legs -irregular heartbeat or palpitations -muscle twitches and spasms -unusually weak or tired Side effects that usually do not require medical attention (report to your doctor or health care professional if they continue or are bothersome): -change in sex drive or performance -depressed mood -diarrhea -difficulty sleeping -headache -menstrual changes -restless or nervous This list may not describe all possible side effects. Call your doctor for medical advice about side effects. You may report side effects to FDA at 1-800-FDA-1088. Where should I keep my medicine? Keep out of the reach of children. Store at room temperature between 20 and 25 degrees C (68 and 77 degrees F). Protect from light. Keep container tightly closed. Throw away any unused medicine after the expiration date. NOTE: This sheet is a summary. It may not cover all possible information. If you have questions about this medicine, talk to your doctor, pharmacist, or health care provider.  2018 Elsevier/Gold Standard (2016-07-30 15:13:45)

## 2017-05-06 ENCOUNTER — Encounter: Payer: Self-pay | Admitting: Obstetrics & Gynecology

## 2017-05-19 ENCOUNTER — Ambulatory Visit: Payer: BC Managed Care – PPO | Admitting: Obstetrics & Gynecology

## 2017-05-20 ENCOUNTER — Encounter: Payer: Self-pay | Admitting: Obstetrics & Gynecology

## 2017-05-20 ENCOUNTER — Ambulatory Visit (INDEPENDENT_AMBULATORY_CARE_PROVIDER_SITE_OTHER): Payer: BC Managed Care – PPO | Admitting: Obstetrics & Gynecology

## 2017-05-20 VITALS — BP 120/80 | HR 73 | Ht 61.0 in | Wt 152.0 lb

## 2017-05-20 DIAGNOSIS — O34219 Maternal care for unspecified type scar from previous cesarean delivery: Secondary | ICD-10-CM

## 2017-05-20 DIAGNOSIS — O9122 Nonpurulent mastitis associated with the puerperium: Secondary | ICD-10-CM

## 2017-05-20 MED ORDER — NORETHINDRONE 0.35 MG PO TABS: 1.0000 | ORAL_TABLET | Freq: Every day | ORAL | 11 refills | Status: DC

## 2017-05-20 NOTE — Patient Instructions (Signed)
Norethindrone tablets (contraception) What is this medicine? NORETHINDRONE (nor eth IN drone) is an oral contraceptive. The product contains a female hormone known as a progestin. It is used to prevent pregnancy. This medicine may be used for other purposes; ask your health care provider or pharmacist if you have questions. COMMON BRAND NAME(S): Camila, Deblitane 28-Day, Errin, Heather, [place], Jolivette, [place], Nor-QD, Nora-BE, Norlyroc, Ortho Micronor, [employer] 28-Day What should I tell my health care provider before I take this medicine? They need to know if you have any of these conditions: -blood vessel disease or blood clots -breast, cervical, or vaginal cancer -diabetes -heart disease -kidney disease -liver disease -mental depression -migraine -seizures -stroke -vaginal bleeding -an unusual or allergic reaction to norethindrone, other medicines, foods, dyes, or preservatives -pregnant or trying to get pregnant -breast-feeding How should I use this medicine? Take this medicine by mouth with a glass of water. You may take it with or without food. Follow the directions on the prescription label. Take this medicine at the same time each day and in the order directed on the package. Do not take your medicine more often than directed. Contact your pediatrician regarding the use of this medicine in children. Special care may be needed. This medicine has been used in female children who have started having menstrual periods. A patient package insert for the product will be given with each prescription and refill. Read this sheet carefully each time. The sheet may change frequently. Overdosage: If you think you have taken too much of this medicine contact a poison control center or emergency room at once. NOTE: This medicine is only for you. Do not share this medicine with others. What if I miss a dose? Try not to miss a dose. Every time you miss a dose or take a dose late your chance of  pregnancy increases. When 1 pill is missed (even if only 3 hours late), take the missed pill as soon as possible and continue taking a pill each day at the regular time (use a back up method of birth control for the next 48 hours). If more than 1 dose is missed, use an additional birth control method for the rest of your pill pack until menses occurs. Contact your health care professional if more than 1 dose has been missed. What may interact with this medicine? Do not take this medicine with any of the following medications: -amprenavir or fosamprenavir -bosentan This medicine may also interact with the following medications: -antibiotics or medicines for infections, especially rifampin, rifabutin, rifapentine, and griseofulvin, and possibly penicillins or tetracyclines -aprepitant -barbiturate medicines, such as phenobarbital -carbamazepine -felbamate -modafinil -oxcarbazepine -phenytoin -ritonavir or other medicines for HIV infection or AIDS -St. John's wort -topiramate This list may not describe all possible interactions. Give your health care provider a list of all the medicines, herbs, non-prescription drugs, or dietary supplements you use. Also tell them if you smoke, drink alcohol, or use illegal drugs. Some items may interact with your medicine. What should I watch for while using this medicine? Visit your doctor or health care professional for regular checks on your progress. You will need a regular breast and pelvic exam and Pap smear while on this medicine. Use an additional method of birth control during the first cycle that you take these tablets. If you have any reason to think you are pregnant, stop taking this medicine right away and contact your doctor or health care professional. If you are taking this medicine for hormone related problems, it  may take several cycles of use to see improvement in your condition. This medicine does not protect you against HIV infection (AIDS)  or any other sexually transmitted diseases. What side effects may I notice from receiving this medicine? Side effects that you should report to your doctor or health care professional as soon as possible: -breast tenderness or discharge -pain in the abdomen, chest, groin or leg -severe headache -skin rash, itching, or hives -sudden shortness of breath -unusually weak or tired -vision or speech problems -yellowing of skin or eyes Side effects that usually do not require medical attention (report to your doctor or health care professional if they continue or are bothersome): -changes in sexual desire -change in menstrual flow -facial hair growth -fluid retention and swelling -headache -irritability -nausea -weight gain or loss This list may not describe all possible side effects. Call your doctor for medical advice about side effects. You may report side effects to FDA at 1-800-FDA-1088. Where should I keep my medicine? Keep out of the reach of children. Store at room temperature between 15 and 30 degrees C (59 and 86 degrees F). Throw away any unused medicine after the expiration date. NOTE: This sheet is a summary. It may not cover all possible information. If you have questions about this medicine, talk to your doctor, pharmacist, or health care provider.  2018 Elsevier/Gold Standard (2012-07-02 16:41:35)  

## 2017-05-20 NOTE — Progress Notes (Signed)
  OBSTETRICS POSTPARTUM CLINIC PROGRESS NOTE  Subjective:     Heather Conner is a 26 y.o. [id number] female who presents for a postpartum visit. She is 6 weeks postpartum following a Term pregnancy and delivery by CS, no problems at delivery.  I have fully reviewed the prenatal and intrapartum course. Anesthesia: epidural.  Postpartum course has been complicated by uncomplicated.  Baby is feeding by Breast. Healing well from mastitis Bleeding: patient has not  resumed menses.  Bowel function is normal. Bladder function is normal.  Patient is not sexually active. Contraception method desired is none.  Postpartum depression screening: negative. Edinburgh 0.  The following portions of the patient's history were reviewed and updated as appropriate: allergies, current medications, past family history, past medical history, past social history, past surgical history and problem list.  Review of Systems Pertinent items are noted in HPI.  Objective:    BP 120/80   Pulse 73   Ht 5\' 1"  (1.549 m)   Wt 152 lb (68.9 kg)   BMI 28.72 kg/m   General:  alert and no distress   Breasts:  inspection negative, no nipple discharge or bleeding, no masses or nodularity palpable  Lungs: clear to auscultation bilaterally  Heart:  regular rate and rhythm, S1, S2 normal, no murmur, click, rub or gallop  Abdomen: soft, non-tender; bowel sounds normal; no masses,  no organomegaly.  CS scar well healed   Vulva:  normal  Vagina: normal vagina, no discharge, exudate, lesion, or erythema  Cervix:  no cervical motion tenderness and no lesions  Corpus: normal size, contour, position, consistency, mobility, non-tender  Adnexa:  normal adnexa and no mass, fullness, tenderness  Rectal Exam: Not performed.          Assessment:  Post Partum Care visit There are no diagnoses linked to this encounter.  Plan:  See orders and Patient Instructions Contraceptive counseling for oral progesterone-only contraceptive Resume  all normal activities Follow up in: a few months or as needed.   Barnett Applebaum, MD, [facility], [facility] 05/20/2017  10:30 AM

## 2017-06-12 ENCOUNTER — Telehealth: Payer: Self-pay

## 2017-06-12 NOTE — Telephone Encounter (Signed)
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 She just found out she needs a return to work note from [facility] today. She must turn it in by Monday. CB# [phone]

## 2017-08-24 ENCOUNTER — Encounter: Payer: Self-pay | Admitting: Obstetrics & Gynecology

## 2017-08-30 ENCOUNTER — Other Ambulatory Visit: Payer: Self-pay | Admitting: Obstetrics & Gynecology

## 2017-08-30 MED ORDER — LEVONORGEST-ETH ESTRAD 91-DAY 0.15-0.03 &0.01 MG PO TABS: 1.0000 | ORAL_TABLET | Freq: Every day | ORAL | 4 refills | Status: DC

## 2017-10-27 ENCOUNTER — Encounter: Payer: Self-pay | Admitting: Emergency Medicine

## 2017-10-27 ENCOUNTER — Other Ambulatory Visit: Payer: Self-pay

## 2017-10-27 ENCOUNTER — Emergency Department
Admission: EM | Admit: 2017-10-27 | Discharge: 2017-10-27 | Disposition: A | Discharge status: Home / Self Care | Payer: BC Managed Care – PPO | Attending: Emergency Medicine | Admitting: Emergency Medicine

## 2017-10-27 ENCOUNTER — Emergency Department: Payer: BC Managed Care – PPO

## 2017-10-27 DIAGNOSIS — Z79899 Other long term (current) drug therapy: Secondary | ICD-10-CM | POA: Insufficient documentation

## 2017-10-27 DIAGNOSIS — R1010 Upper abdominal pain, unspecified: Secondary | ICD-10-CM

## 2017-10-27 LAB — COMPREHENSIVE METABOLIC PANEL
ALT: 18 U/L (ref 14–54)
AST: 20 U/L (ref 15–41)
Albumin: 3.7 g/dL (ref 3.5–5.0)
Alkaline Phosphatase: 121 U/L (ref 38–126)
Anion gap: 7 (ref 5–15)
BUN: 14 mg/dL (ref 6–20)
CHLORIDE: 108 mmol/L (ref 101–111)
CO2: 24 mmol/L (ref 22–32)
CREATININE: 0.9 mg/dL (ref 0.44–1.00)
Calcium: 9.2 mg/dL (ref 8.9–10.3)
GFR calc Af Amer: >60 mL/min (ref >60)
GFR calc non Af Amer: >60 mL/min (ref >60)
Glucose, Bld: 127 mg/dL — ABNORMAL HIGH (ref 65–99)
Potassium: 3.9 mmol/L (ref 3.5–5.1)
SODIUM: 139 mmol/L (ref 135–145)
Total Bilirubin: 0.5 mg/dL (ref 0.3–1.2)
Total Protein: 7.7 g/dL (ref 6.5–8.1)

## 2017-10-27 LAB — CBC
HCT: 33.3 % — ABNORMAL LOW (ref 35.0–47.0)
Hemoglobin: 11.5 g/dL — ABNORMAL LOW (ref 12.0–16.0)
MCH: 30.9 pg (ref 26.0–34.0)
MCHC: 34.4 g/dL (ref 32.0–36.0)
MCV: 89.7 fL (ref 80.0–100.0)
PLATELETS: 311 10*3/uL (ref 150–440)
RBC: 3.71 MIL/uL — AB (ref 3.80–5.20)
RDW: 14 % (ref 11.5–14.5)
WBC: 8.6 10*3/uL (ref 3.6–11.0)

## 2017-10-27 LAB — URINALYSIS, COMPLETE (UACMP) WITH MICROSCOPIC
Bacteria, UA: NONE SEEN
Bilirubin Urine: NEGATIVE
GLUCOSE, UA: NEGATIVE mg/dL
KETONES UR: NEGATIVE mg/dL
Nitrite: NEGATIVE
PROTEIN: NEGATIVE mg/dL
Specific Gravity, Urine: 1.02 (ref 1.005–1.030)
pH: 6 (ref 5.0–8.0)

## 2017-10-27 LAB — POCT PREGNANCY, URINE: Preg Test, Ur: NEGATIVE

## 2017-10-27 LAB — LIPASE, BLOOD: Lipase: 30 U/L (ref 11–51)

## 2017-10-27 MED ORDER — SODIUM CHLORIDE 0.9 % IV BOLUS (SEPSIS)
1000.0000 mL | Freq: Once | INTRAVENOUS | Status: AC
  Administered 2017-10-27: 1000 mL via INTRAVENOUS

## 2017-10-27 MED ORDER — IOPAMIDOL (ISOVUE-300) INJECTION 61%
100.0000 mL | Freq: Once | INTRAVENOUS | Status: AC | PRN
  Administered 2017-10-27: 100 mL via INTRAVENOUS

## 2017-10-27 NOTE — ED Provider Notes (Signed)
[facility] Emergency Department Provider Note   First MD Initiated Contact with Patient 10/27/17 [phone]     (approximate)  I have reviewed the triage vital signs and the nursing notes.   HISTORY  Chief Complaint Abdominal Pain    HPI Heather Conner is a 27 y.o. female with below list of chronic medical conditions presents to the emergency department with acute onset of 10 out of 10 upper abdominal discomfort accompanied by vomiting.  Patient states that pain began shortly before arrival and has completely resolved.  Patient's pain score at present 0 out of 10.   Past Medical History:  Diagnosis Date  . Anemia   . Eczema   . Endometriosis   . History of ovarian cyst   . Migraine    On continuous BC for prevention    Patient Active Problem List   Diagnosis Date Noted  . Mastitis during puerperium 04/24/2017  . Previous cesarean delivery, delivered 04/21/2017  . Mastitis associated with childbirth, delivered 04/21/2017  . Anemia following surgery 04/13/2017    Past Surgical History:  Procedure Laterality Date  . ABLATION ON ENDOMETRIOSIS  2012  . CESAREAN SECTION N/A 04/12/2017   Procedure: CESAREAN SECTION;  Surgeon: Gae Dry, MD;  Location: [facility] ORS;  Service: Obstetrics;  Laterality: N/A;  . LAPAROSCOPIC OVARIAN CYSTECTOMY Right 2012    Prior to Admission medications   Medication Sig Start Date End Date Taking? Authorizing Provider  ibuprofen (ADVIL,MOTRIN) 600 MG tablet Take 1 tablet (600 mg total) by mouth every 6 (six) hours as needed for mild pain. 04/15/17   [name], MD  Levonorgestrel-Ethinyl Estradiol (AMETHIA,CAMRESE) 0.15-0.03 &0.01 MG tablet Take 1 tablet daily by mouth. 08/30/17   Gae Dry, MD  Prenatal Vit-Fe Fumarate-FA (PRENATAL VITAMIN PO) Take by mouth.    [provider]    Allergies No known drug allergies  Family History  Problem Relation Age of Onset  . Hypertension Mother   . Birth  defects Mother        Congential heart condition   . Endometriosis Mother   . Cancer Father        Prostate  . Diabetes Paternal Grandfather   . Stroke Paternal Grandfather     Social History Social History   Tobacco Use  . Smoking status: Never Smoker  . Smokeless tobacco: Never Used  Substance Use Topics  . Alcohol use: No    Alcohol/week: 0.0 oz  . Drug use: No    Review of Systems Constitutional: No fever/chills Eyes: No visual changes. ENT: No sore throat. Cardiovascular: Denies chest pain. Respiratory: Denies shortness of breath. Gastrointestinal: Positive for abdominal pain.  No nausea, no vomiting.  No diarrhea.  No constipation. Genitourinary: Negative for dysuria. Musculoskeletal: Negative for neck pain.  Negative for back pain. Integumentary: Negative for rash. Neurological: Negative for headaches, focal weakness or numbness.  ____________________________________________   PHYSICAL EXAM:  VITAL SIGNS: ED Triage Vitals  Enc Vitals Group     BP 10/27/17 0310 (!) 110/55     Pulse Rate 10/27/17 0310 88     Resp 10/27/17 0310 18     Temp 10/27/17 0310 98.8 F (37.1 C)     Temp Source 10/27/17 0310 Oral     SpO2 10/27/17 0310 100 %     Weight 10/27/17 0258 72.6 kg (160 lb)     Height 10/27/17 0258 1.549 m (5\' 1" )     Head Circumference --  Peak Flow --      Pain Score 10/27/17 0258 2     Pain Loc --      Pain Edu? --      Excl. in Pulcifer? --     Constitutional: Alert and oriented. Well appearing and in no acute distress. Eyes: Conjunctivae are normal.  Head: Atraumatic. Mouth/Throat: Mucous membranes are moist.  Oropharynx non-erythematous. Neck: No stridor.   Cardiovascular: Normal rate, regular rhythm. Good peripheral circulation. Grossly normal heart sounds. Respiratory: Normal respiratory effort.  No retractions. Lungs CTAB. Gastrointestinal: Soft and nontender. No distention.  Musculoskeletal: No lower extremity tenderness nor edema. No  gross deformities of extremities. Neurologic:  Normal speech and language. No gross focal neurologic deficits are appreciated.  Skin:  Skin is warm, dry and intact. No rash noted. Psychiatric: Mood and affect are normal. Speech and behavior are normal.  ____________________________________________   LABS (all labs ordered are listed, but only abnormal results are displayed)  Labs Reviewed  CBC - Abnormal; Notable for the following components:      Result Value   RBC 3.71 (*)    Hemoglobin 11.5 (*)    HCT 33.3 (*)    All other components within normal limits  COMPREHENSIVE METABOLIC PANEL - Abnormal; Notable for the following components:   Glucose, Bld 127 (*)    All other components within normal limits  URINALYSIS, COMPLETE (UACMP) WITH MICROSCOPIC - Abnormal; Notable for the following components:   Color, Urine YELLOW (*)    APPearance CLEAR (*)    Hgb urine dipstick MODERATE (*)    Leukocytes, UA TRACE (*)    Squamous Epithelial / LPF 0-5 (*)    All other components within normal limits  LIPASE, BLOOD  POCT PREGNANCY, URINE    RADIOLOGY I, [place] Kayliah Tindol, personally viewed and evaluated these images (plain radiographs) as part of my medical decision making, as well as reviewing the written report by the radiologist.  Ct Abdomen Pelvis W Contrast  Result Date: 10/27/2017 CLINICAL DATA:  Acute onset of lower abdominal pain and nausea. EXAM: CT ABDOMEN AND PELVIS WITH CONTRAST TECHNIQUE: Multidetector CT imaging of the abdomen and pelvis was performed using the standard protocol following bolus administration of intravenous contrast. CONTRAST:  161mL ISOVUE-300 IOPAMIDOL (ISOVUE-300) INJECTION 61% COMPARISON:  Pelvic ultrasound performed 12/15/2016 FINDINGS: Lower chest: The visualized lung bases are grossly clear. The visualized portions of the mediastinum are unremarkable. Hepatobiliary: The liver is unremarkable in appearance. The gallbladder is unremarkable in appearance. The  common bile duct remains normal in caliber. Pancreas: The pancreas is within normal limits. Spleen: The spleen is unremarkable in appearance. Adrenals/Urinary Tract: The adrenal glands are unremarkable in appearance. The kidneys are within normal limits. There is no evidence of hydronephrosis. No renal or ureteral stones are identified. No perinephric stranding is seen. Stomach/Bowel: The stomach is unremarkable in appearance. The small bowel is within normal limits. The appendix is normal in caliber, without evidence of appendicitis. The colon is unremarkable in appearance. Vascular/Lymphatic: The abdominal aorta is unremarkable in appearance. The inferior vena cava is grossly unremarkable. No retroperitoneal lymphadenopathy is seen. No pelvic sidewall lymphadenopathy is identified. Reproductive: The bladder is mildly distended and within normal limits. The uterus is grossly unremarkable in appearance. A tampon is noted at the vagina. The ovaries are relatively symmetric. No suspicious adnexal masses are seen. Other: Minimal nonspecific omental haziness is noted at the right lower quadrant. Musculoskeletal: No acute osseous abnormalities are identified. The visualized musculature is unremarkable in  appearance. IMPRESSION: 1. No definite acute abnormality seen within the abdomen or pelvis. 2. Minimal nonspecific omental haziness noted at the right lower quadrant. Adjacent bowel is unremarkable in appearance. Electronically Signed   By: Garald Balding M.D.   On: 10/27/2017 06:49     Procedures   ____________________________________________   INITIAL IMPRESSION / ASSESSMENT AND PLAN / ED COURSE  As part of my medical decision making, I reviewed the following data within the electronic MEDICAL RECORD [id number] year old female presenting with history of abdominal pain which resolved before my evaluation and patient has remained pain-free while in the emergency CT scan of the abdomen acute intra-abdominal  pathology. ____________________________________________  FINAL CLINICAL IMPRESSION(S) / ED DIAGNOSES  Final diagnoses:  Pain of upper abdomen     MEDICATIONS GIVEN DURING THIS VISIT:  Medications  iopamidol (ISOVUE-300) 61 % injection 100 mL (100 mLs Intravenous Contrast Given 10/27/17 0631)  sodium chloride 0.9 % bolus 1,000 mL (0 mLs Intravenous Stopped 10/27/17 0719)     ED Discharge Orders    None       Note:  This document was prepared using Dragon voice recognition software and may include unintentional dictation errors.    Gregor Hams, MD 10/27/17 [phone]

## 2017-10-27 NOTE — ED Notes (Signed)
Pt went to CT

## 2017-10-27 NOTE — ED Triage Notes (Signed)
Pt to triage via w/c, appears uncomfortable; c/o lower abd pain, nonradiating tonight accomp by nausea

## 2017-10-27 NOTE — ED Notes (Signed)
Pt unable to urinate at this time, specimen cup provided by this tech

## 2018-02-25 ENCOUNTER — Ambulatory Visit: Payer: BC Managed Care – PPO | Admitting: Internal Medicine

## 2018-02-25 ENCOUNTER — Encounter: Payer: Self-pay | Admitting: Internal Medicine

## 2018-02-25 VITALS — BP 110/74 | HR 83 | Temp 98.3°F | Ht 61.0 in | Wt 167.0 lb

## 2018-02-25 DIAGNOSIS — E669 Obesity, unspecified: Secondary | ICD-10-CM

## 2018-02-25 DIAGNOSIS — R319 Hematuria, unspecified: Secondary | ICD-10-CM | POA: Insufficient documentation

## 2018-02-25 DIAGNOSIS — E559 Vitamin D deficiency, unspecified: Secondary | ICD-10-CM

## 2018-02-25 DIAGNOSIS — Z0000 Encounter for general adult medical examination without abnormal findings: Secondary | ICD-10-CM

## 2018-02-25 DIAGNOSIS — Z6831 Body mass index (BMI) 31.0-31.9, adult: Secondary | ICD-10-CM

## 2018-02-25 DIAGNOSIS — N83201 Unspecified ovarian cyst, right side: Secondary | ICD-10-CM

## 2018-02-25 DIAGNOSIS — N809 Endometriosis, unspecified: Secondary | ICD-10-CM

## 2018-02-25 DIAGNOSIS — N83209 Unspecified ovarian cyst, unspecified side: Secondary | ICD-10-CM | POA: Insufficient documentation

## 2018-02-25 DIAGNOSIS — L309 Dermatitis, unspecified: Secondary | ICD-10-CM

## 2018-02-25 DIAGNOSIS — Z13220 Encounter for screening for lipoid disorders: Secondary | ICD-10-CM

## 2018-02-25 DIAGNOSIS — D649 Anemia, unspecified: Secondary | ICD-10-CM

## 2018-02-25 DIAGNOSIS — Z0184 Encounter for antibody response examination: Secondary | ICD-10-CM

## 2018-02-25 DIAGNOSIS — Z1329 Encounter for screening for other suspected endocrine disorder: Secondary | ICD-10-CM

## 2018-02-25 DIAGNOSIS — R739 Hyperglycemia, unspecified: Secondary | ICD-10-CM

## 2018-02-25 MED ORDER — TRIAMCINOLONE ACETONIDE 0.1 % EX CREA: 1.0000 "application " | TOPICAL_CREAM | Freq: Two times a day (BID) | CUTANEOUS | 0 refills | Status: DC

## 2018-02-25 NOTE — Progress Notes (Signed)
Pre visit review using our clinic review tool, if applicable. No additional management support is needed unless otherwise documented below in the visit note. 

## 2018-02-25 NOTE — Progress Notes (Addendum)
Chief Complaint  Patient presents with  . Follow-up    transfer  . Rash   Transfer Cook  1. Eczema to forearms at times to chest given TMC 0.5 % cream in the past not using. Using gain detergent, dove soap, and coconut oil  She c/o lesions to axilla at times papules and pustules not currently present  Will refer to dermatology further w/u and tx   2. Physical sch next week 03/05/18 will get labs before     Review of Systems  Constitutional: Negative for weight loss.  HENT: Negative for hearing loss.   Eyes: Negative for blurred vision.  Respiratory: Negative for shortness of breath.   Cardiovascular: Negative for chest pain.  Musculoskeletal: Negative for falls.  Skin: Positive for rash.  Neurological: Negative for headaches.  Psychiatric/Behavioral: Negative for depression.   Past Medical History:  Diagnosis Date  . Anemia   . Eczema   . Endometriosis   . History of ovarian cyst   . Migraine    On continuous BC for prevention   Past Surgical History:  Procedure Laterality Date  . ABLATION ON ENDOMETRIOSIS  2012  . CESAREAN SECTION N/A 04/12/2017   Procedure: CESAREAN SECTION;  Surgeon: Gae Dry, MD;  Location: [facility] ORS;  Service: Obstetrics;  Laterality: N/A;  . LAPAROSCOPIC OVARIAN CYSTECTOMY Right 2012   Family History  Problem Relation Age of Onset  . Hypertension Mother   . Birth defects Mother        Congential heart condition   . Endometriosis Mother   . Cancer Father        Prostate  . Diabetes Paternal Grandfather   . Stroke Paternal Grandfather    Social History   Socioeconomic History  . Marital status: Married    Spouse name: Elta Angell - Fiance  . Number of children: Not on file  . Years of education: 75  . Highest education level: Not on file  Occupational History  . 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 Financial resource strain: Not on file  . Food  insecurity:    Worry: Not on file    Inability: Not on file  . Transportation needs:    Medical: Not on file    Non-medical: Not on file  Tobacco Use  . Smoking status: Never Smoker  . Smokeless tobacco: Never Used  Substance and Sexual Activity  . Alcohol use: No    Alcohol/week: 0.0 oz  . Drug use: No  . Sexual activity: Not Currently    Partners: Male    Birth control/protection: None  Lifestyle  . Physical activity:    Days per week: Not on file    Minutes per session: Not on file  . Stress: Not on file  Relationships  . Social connections:    Talks on phone: Not on file    Gets together: Not on file    Attends religious service: Not on file    Active member of club or organization: Not on file    Attends meetings of clubs or organizations: Not on file    Relationship status: Not on file  . Intimate partner violence:    Fear of current or ex partner: Not on file    Emotionally abused: Not on file    Physically abused: Not on file    Forced sexual activity: Not on file  Other Topics Concern  . Not  on file  Social History Narrative   Amirrah was born and reared in [place], [place]. She graduated from [employer] in 2008 and then went to [employer] and obtained her Bachelors in [employer] in 2012. She currently lives at home with her parents. She has a turtle named Crush. 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 She enjoys photography, hanging out with friends. She enjoys action movies. 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      Caffeine- Coffee 1 cup, 1 small can of soda   Current Meds  Medication Sig  . Levonorgestrel-Ethinyl Estradiol (AMETHIA,CAMRESE) 0.15-0.03 &0.01 MG tablet Take 1 tablet daily by mouth.   Current Facility-Administered Medications for the 02/25/18 encounter (Office Visit) with McLean-Scocuzza, Nino Glow, MD  Medication  . triamcinolone cream (KENALOG) 0.5 %    No Known Allergies No results found for this or any previous visit (from the past 2160 hour(s)). Objective  Body mass index is 31.55 kg/m. Wt Readings from Last 3 Encounters:  02/25/18 167 lb (75.8 kg)  10/27/17 160 lb (72.6 kg)  05/20/17 152 lb (68.9 kg)   Temp Readings from Last 3 Encounters:  02/25/18 98.3 F (36.8 C) (Oral)  10/27/17 98.8 F (37.1 C) (Oral)  04/30/17 98 F (36.7 C) (Oral)   BP Readings from Last 3 Encounters:  02/25/18 110/74  10/27/17 120/71  05/20/17 120/80   Pulse Readings from Last 3 Encounters:  02/25/18 83  10/27/17 87  05/20/17 73    Physical Exam  Constitutional: She is oriented to person, place, and time. Vital signs are normal. She appears well-developed and well-nourished. She is cooperative.  HENT:  Head: Normocephalic and atraumatic.  Mouth/Throat: Oropharynx is clear and moist and mucous membranes are normal.  Eyes: Pupils are equal, round, and reactive to light. Conjunctivae are normal.  Cardiovascular: Normal rate, regular rhythm and normal heart sounds.  Pulmonary/Chest: Effort normal and breath sounds normal.  Neurological: She is alert and oriented to person, place, and time. Gait normal.  Skin: Skin is warm and dry. Rash noted.  Eczematous changes to left forearm/hand   Psychiatric: She has a normal mood and affect. Her speech is normal and behavior is normal. Judgment and thought content normal. Cognition and memory are normal.    Assessment   1. Eczema  2. ?hidradenitis suppurativa lesions to axilla noted on photos on phone 3. Obesity BMI 31.55  4. HM Plan  1 and 2. Refer to dermatology Dr. Kellie Moor tx eczema and also assess if has #2  TMC 0.1 bid prn  Avoid gain use All free/hypoallergenic detergent, dove soap/cetaphil/cerave cream  3. Needs healthy diet choices and exercise  4.  Had flu shot  Tdap UTD  Check labs before f/u BMET, CBC, TSH, T4, anemia panel, A1C, lipid, vit D, hep B titer, UA (h/o  hematuria)  Physical at f/u  Pap get records [facility] obtained had Dr. Glennon Mac 08/28/16 negative pap no GC/C no HPV testing done  Provider: Dr. Olivia Mackie McLean-Scocuzza-Internal Medicine

## 2018-02-25 NOTE — Patient Instructions (Addendum)
Try to avoid Gain and try All clear/hyoallergenic detergents  Cetaphil or Cerave Cream ([employer] or [employer])  Please sch labs before 5/10  I referred you to dermatology Dr. Kellie Moor   Eczema Eczema is a broad term for a group of skin conditions that cause skin to become rough and inflamed. Each type of eczema has different triggers, symptoms, and treatments. Eczema of any type is usually itchy and symptoms range from mild to severe. Eczema and its symptoms are not spread from person to person (are not contagious). It can appear on different parts of the body at different times. Your eczema may not look the same as someone else's eczema. What are the types of eczema? Atopic dermatitis This is a long-term (chronic) skin disease that keeps coming back (recurring). Usual symptoms are dry skin and small, solid pimples that may swell and leak fluid (weep). Contact dermatitis This happens when something irritates the skin and causes a rash. The irritation can come from substances that you are allergic to (allergens), such as poison ivy, chemicals, or medicines that were applied to your skin. Dyshidrotic eczema This is a form of eczema on the hands and feet. It shows up as very itchy, fluid-filled blisters. It can affect people of any age, but is more common before age 8. Hand eczema This causes very itchy areas of skin on the palms and sides of the hands and fingers. This type of eczema is common in industrial jobs where you may be exposed to many different types of irritants. Lichen simplex chronicus This type of eczema occurs when a person constantly scratches one area of the body. Repeated scratching of the area leads to thickened skin (lichenification). Lichen simplex chronicus can occur along with other types of eczema. It is more common in adults, but may be seen in children as well. Nummular eczema This is a common type of eczema. It has no known cause. It typically causes a red, circular, crusty  lesion (plaque) that may be itchy. Scratching may become a habit and can cause bleeding. Nummular eczema occurs most often in people of middle-age or older. It most often affects the hands. Seborrheic dermatitis This is a common skin disease that mainly affects the scalp. It may also affect any oily areas of the body, such as the face, sides of nose, eyebrows, ears, eyelids, and chest. It is marked by small scaling and redness of the skin (erythema). This can affect people of all ages. In infants, this condition is known as Chartered certified accountant." Stasis dermatitis This is a common skin disease that usually appears on the legs and feet. It most often occurs in people who have a condition that prevents blood from being pumped through the veins in the legs (chronic venous insufficiency). Stasis dermatitis is a chronic condition that needs long-term management. How is eczema diagnosed? Your health care provider will examine your skin and review your medical history. He or she may also give you skin patch tests. These tests involve taking patches that contain possible allergens and placing them on your back. He or she will then check in a few days to see if an allergic reaction occurred. What are the common treatments? Treatment for eczema is based on the type of eczema you have. Hydrocortisone steroid medicine can relieve itching quickly and help reduce inflammation. This medicine may be prescribed or obtained over-the-counter, depending on the strength of the medicine that is needed. Follow these instructions at home:  Take over-the-counter and prescription medicines only  as told by your health care provider.  Use creams or ointments to moisturize your skin. Do not use lotions.  Learn what triggers or irritates your symptoms. Avoid these things.  Treat symptom flare-ups quickly.  Do not itch your skin. This can make your rash worse.  Keep all follow-up visits as told by your health care provider. This is  important. Where to find more information:  The American Academy of Dermatology: http://jones-macias.info/  The National Eczema Association: www.nationaleczema.org Contact a health care provider if:  You have serious itching, even with treatment.  You regularly scratch your skin until it bleeds.  Your rash looks different than usual.  Your skin is painful, swollen, or more red than usual.  You have a fever. Summary  There are eight general types of eczema. Each type has different triggers.  Eczema of any type causes itching that may range from mild to severe.  Treatment varies based on the type of eczema you have. Hydrocortisone steroid medicine can help with itching and inflammation.  Protecting your skin is the best way to prevent eczema. Use moisturizers and lotions. Avoid triggers and irritants, and treat flare-ups quickly. This information is not intended to replace advice given to you by your health care provider. Make sure you discuss any questions you have with your health care provider. Document Released: 02/26/2017 Document Revised: 02/26/2017 Document Reviewed: 02/26/2017 Elsevier Interactive Patient Education  2018 [employer].

## 2018-03-03 ENCOUNTER — Other Ambulatory Visit (INDEPENDENT_AMBULATORY_CARE_PROVIDER_SITE_OTHER): Payer: BC Managed Care – PPO

## 2018-03-03 DIAGNOSIS — R319 Hematuria, unspecified: Secondary | ICD-10-CM

## 2018-03-03 DIAGNOSIS — Z0000 Encounter for general adult medical examination without abnormal findings: Secondary | ICD-10-CM

## 2018-03-03 DIAGNOSIS — R739 Hyperglycemia, unspecified: Secondary | ICD-10-CM

## 2018-03-03 DIAGNOSIS — D649 Anemia, unspecified: Secondary | ICD-10-CM

## 2018-03-03 DIAGNOSIS — Z1329 Encounter for screening for other suspected endocrine disorder: Secondary | ICD-10-CM

## 2018-03-03 DIAGNOSIS — Z0184 Encounter for antibody response examination: Secondary | ICD-10-CM

## 2018-03-03 DIAGNOSIS — E559 Vitamin D deficiency, unspecified: Secondary | ICD-10-CM

## 2018-03-03 DIAGNOSIS — Z13220 Encounter for screening for lipoid disorders: Secondary | ICD-10-CM | POA: Diagnosis not present

## 2018-03-03 NOTE — Addendum Note (Signed)
Addended by: Arby Barrette on: 03/03/2018 07:55 AM   Modules accepted: Orders

## 2018-03-04 ENCOUNTER — Encounter: Payer: Self-pay | Admitting: Internal Medicine

## 2018-03-04 LAB — LIPID PANEL
CHOL/HDL RATIO: 4.9 (calc) (ref <5.0)
Cholesterol: 180 mg/dL (ref <200)
HDL: 37 mg/dL — AB (ref >50)
LDL Cholesterol (Calc): 123 mg/dL (calc) — ABNORMAL HIGH
NON-HDL CHOLESTEROL (CALC): 143 mg/dL — AB (ref <130)
TRIGLYCERIDES: 95 mg/dL (ref <150)

## 2018-03-04 LAB — BASIC METABOLIC PANEL
BUN: 15 mg/dL (ref 7–25)
CHLORIDE: 105 mmol/L (ref 98–110)
CO2: 22 mmol/L (ref 20–32)
CREATININE: 0.91 mg/dL (ref 0.50–1.10)
Calcium: 9.7 mg/dL (ref 8.6–10.2)
Glucose, Bld: 86 mg/dL (ref 65–99)
POTASSIUM: 4.6 mmol/L (ref 3.5–5.3)
SODIUM: 138 mmol/L (ref 135–146)

## 2018-03-04 LAB — MICROSCOPIC EXAMINATION
BACTERIA UA: NONE SEEN
Casts: NONE SEEN /lpf
WBC, UA: NONE SEEN /hpf (ref 0–5)

## 2018-03-04 LAB — URINALYSIS, ROUTINE W REFLEX MICROSCOPIC
BILIRUBIN UA: NEGATIVE
GLUCOSE, UA: NEGATIVE
KETONES UA: NEGATIVE
LEUKOCYTES UA: NEGATIVE
Nitrite, UA: NEGATIVE
PROTEIN UA: NEGATIVE
SPEC GRAV UA: 1.011 (ref 1.005–1.030)
Urobilinogen, Ur: 0.2 mg/dL (ref 0.2–1.0)
pH, UA: 5 (ref 5.0–7.5)

## 2018-03-04 LAB — IRON,TIBC AND FERRITIN PANEL
%SAT: 39 % (calc) (ref 11–50)
FERRITIN: 40 ng/mL (ref 10–154)
Iron: 141 ug/dL (ref 40–190)
TIBC: 360 mcg/dL (calc) (ref 250–450)

## 2018-03-04 LAB — CBC WITH DIFFERENTIAL/PLATELET
Basophils Absolute: 62 cells/uL (ref 0–200)
Basophils Relative: 0.8 %
Eosinophils Absolute: 117 cells/uL (ref 15–500)
Eosinophils Relative: 1.5 %
HEMATOCRIT: 34 % — AB (ref 35.0–45.0)
Hemoglobin: 11.9 g/dL (ref 11.7–15.5)
LYMPHS ABS: 2012 {cells}/uL (ref 850–3900)
MCH: 30.8 pg (ref 27.0–33.0)
MCHC: 35 g/dL (ref 32.0–36.0)
MCV: 88.1 fL (ref 80.0–100.0)
MPV: 11.3 fL (ref 7.5–12.5)
Monocytes Relative: 7.3 %
Neutro Abs: 5039 cells/uL (ref 1500–7800)
Neutrophils Relative %: 64.6 %
Platelets: 313 10*3/uL (ref 140–400)
RBC: 3.86 10*6/uL (ref 3.80–5.10)
RDW: 12.7 % (ref 11.0–15.0)
Total Lymphocyte: 25.8 %
WBC: 7.8 10*3/uL (ref 3.8–10.8)
WBCMIX: 569 {cells}/uL (ref 200–950)

## 2018-03-04 LAB — TSH: TSH: 1.83 mIU/L

## 2018-03-04 LAB — VITAMIN D 25 HYDROXY (VIT D DEFICIENCY, FRACTURES): Vit D, 25-Hydroxy: 47 ng/mL (ref 30–100)

## 2018-03-04 LAB — HEPATITIS B SURFACE ANTIBODY, QUANTITATIVE: Hepatitis B-Post: 79 m[IU]/mL (ref >=10)

## 2018-03-04 LAB — HEMOGLOBIN A1C
Hgb A1c MFr Bld: 5.7 % of total Hgb — ABNORMAL HIGH (ref <5.7)
Mean Plasma Glucose: 117 (calc)
eAG (mmol/L): 6.5 (calc)

## 2018-03-04 LAB — T4, FREE: Free T4: 1.2 ng/dL (ref 0.8–1.8)

## 2018-03-05 ENCOUNTER — Encounter: Payer: Self-pay | Admitting: Internal Medicine

## 2018-03-05 ENCOUNTER — Ambulatory Visit (INDEPENDENT_AMBULATORY_CARE_PROVIDER_SITE_OTHER): Payer: BC Managed Care – PPO | Admitting: Internal Medicine

## 2018-03-05 VITALS — BP 120/70 | HR 100 | Temp 98.4°F | Ht 61.0 in | Wt 166.8 lb

## 2018-03-05 DIAGNOSIS — L732 Hidradenitis suppurativa: Secondary | ICD-10-CM | POA: Diagnosis not present

## 2018-03-05 DIAGNOSIS — R7303 Prediabetes: Secondary | ICD-10-CM

## 2018-03-05 DIAGNOSIS — Z0000 Encounter for general adult medical examination without abnormal findings: Secondary | ICD-10-CM

## 2018-03-05 DIAGNOSIS — N6310 Unspecified lump in the right breast, unspecified quadrant: Secondary | ICD-10-CM | POA: Insufficient documentation

## 2018-03-05 DIAGNOSIS — L989 Disorder of the skin and subcutaneous tissue, unspecified: Secondary | ICD-10-CM

## 2018-03-05 DIAGNOSIS — Z0001 Encounter for general adult medical examination with abnormal findings: Secondary | ICD-10-CM | POA: Diagnosis not present

## 2018-03-05 DIAGNOSIS — J069 Acute upper respiratory infection, unspecified: Secondary | ICD-10-CM | POA: Diagnosis not present

## 2018-03-05 DIAGNOSIS — L309 Dermatitis, unspecified: Secondary | ICD-10-CM

## 2018-03-05 MED ORDER — DOXYCYCLINE HYCLATE 100 MG PO TABS: 100.0000 mg | ORAL_TABLET | Freq: Two times a day (BID) | ORAL | 0 refills | Status: DC

## 2018-03-05 NOTE — Progress Notes (Addendum)
Chief Complaint  Patient presents with  . Annual Exam   Annual  1. H/o mastitis right breast 04/2017 skin changes have resolved but still has mass in right breast 2-3 oclock 2. C/o nasal congestion child sick at home tried zytrec and ibuprofen for nasal pressure  3. Reviewed labs rec exercise to lose   Review of Systems  Constitutional: Negative for weight loss.  HENT: Positive for congestion. Negative for hearing loss.   Eyes: Negative for blurred vision.  Respiratory: Negative for shortness of breath.   Cardiovascular: Negative for chest pain.  Gastrointestinal: Negative for heartburn.  Musculoskeletal: Negative for falls.  Skin: Negative for rash.  Neurological: Negative for headaches.  Psychiatric/Behavioral: Negative for depression.   Past Medical History:  Diagnosis Date  . Anemia   . Eczema   . Eczema   . Endometriosis   . History of ovarian cyst   . Migraine    On continuous BC for prevention   Past Surgical History:  Procedure Laterality Date  . ABLATION ON ENDOMETRIOSIS  2012  . CESAREAN SECTION N/A 04/12/2017   Procedure: CESAREAN SECTION;  Surgeon: Gae Dry, MD;  Location: [facility] ORS;  Service: Obstetrics;  Laterality: N/A;  . LAPAROSCOPIC OVARIAN CYSTECTOMY Right 2012   Family History  Problem Relation Age of Onset  . Hypertension Mother   . Birth defects Mother        Congential heart condition   . Endometriosis Mother   . Diabetes Mother   . Other Mother        endometrosis  . Cancer Father        Prostate  . Diabetes Paternal Grandfather   . Stroke Paternal Grandfather    Social History   Socioeconomic History  . Marital status: Married    Spouse name: Naelani Lafrance - Fiance  . Number of children: Not on file  . Years of education: 64  . Highest education level: Not on file  Occupational History  . 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 Financial resource  strain: Not on file  . Food insecurity:    Worry: Not on file    Inability: Not on file  . Transportation needs:    Medical: Not on file    Non-medical: Not on file  Tobacco Use  . Smoking status: Never Smoker  . Smokeless tobacco: Never Used  Substance and Sexual Activity  . Alcohol use: No    Alcohol/week: 0.0 oz  . Drug use: No  . Sexual activity: Not Currently    Partners: Male    Birth control/protection: None  Lifestyle  . Physical activity:    Days per week: Not on file    Minutes per session: Not on file  . Stress: Not on file  Relationships  . Social connections:    Talks on phone: Not on file    Gets together: Not on file    Attends religious service: Not on file    Active member of club or organization: Not on file    Attends meetings of clubs or organizations: Not on file    Relationship status: Not on file  . Intimate partner violence:    Fear of current or ex partner: Not on file    Emotionally abused: Not on file    Physically abused: Not on file    Forced sexual activity: Not on file  Other Topics Concern  .  Not on file  Social History Narrative   Leafy was born and reared in [place], [place]. She graduated from [employer] in 2008 and then went to [employer] and obtained her Bachelors in [employer] in 2012. Married with 1 son had in 2018.  She has a turtle named Crush. 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 She enjoys photography, hanging out with friends. She enjoys action movies. 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      Caffeine- Coffee 1 cup, 1 small can of soda   Current Meds  Medication Sig  . Levonorgestrel-Ethinyl Estradiol (AMETHIA,CAMRESE) 0.15-0.03 &0.01 MG tablet Take 1 tablet daily by mouth.  . triamcinolone cream (KENALOG) 0.1 % Apply 1 application topically 2 (two) times daily. As needed. Not face, underarms or private area   Current  Facility-Administered Medications for the 03/05/18 encounter (Office Visit) with McLean-Scocuzza, Nino Glow, MD  Medication  . triamcinolone cream (KENALOG) 0.5 %   No Known Allergies Recent Results (from the past 2160 hour(s))  Lipid panel     Status: Abnormal   Collection Time: 03/03/18  7:55 AM  Result Value Ref Range   Cholesterol 180 <200 mg/dL   HDL 37 (L) >50 mg/dL   Triglycerides 95 <150 mg/dL   LDL Cholesterol (Calc) 123 (H) mg/dL (calc)    Comment: Reference range: <100 . Desirable range <100 mg/dL for primary prevention;   <70 mg/dL for patients with CHD or diabetic patients  with > or = 2 CHD risk factors. Marland Kitchen LDL-C is now calculated using the Martin-Hopkins  calculation, which is a validated novel method providing  better accuracy than the Friedewald equation in the  estimation of LDL-C.  Cresenciano Genre et al. Annamaria Helling. 3500;938(18): 2061-2068  (http://education.[facility].com/faq/FAQ164)    Total CHOL/HDL Ratio 4.9 <5.0 (calc)   Non-HDL Cholesterol (Calc) 143 (H) <130 mg/dL (calc)    Comment: For patients with diabetes plus 1 major ASCVD risk  factor, treating to a non-HDL-C goal of <100 mg/dL  (LDL-C of <70 mg/dL) is considered a therapeutic  option.   Hemoglobin A1c     Status: Abnormal   Collection Time: 03/03/18  7:55 AM  Result Value Ref Range   Hgb A1c MFr Bld 5.7 (H) <5.7 % of total Hgb    Comment: For someone without known diabetes, a hemoglobin  A1c value between 5.7% and 6.4% is consistent with prediabetes and should be confirmed with a  follow-up test. . For someone with known diabetes, a value <7% indicates that their diabetes is well controlled. A1c targets should be individualized based on duration of diabetes, age, comorbid conditions, and other considerations. . This assay result is consistent with an increased risk of diabetes. . Currently, no consensus exists regarding use of hemoglobin A1c for diagnosis of diabetes for children. .     Mean Plasma Glucose 117 (calc)   eAG (mmol/L) 6.5 (calc)  TSH     Status: None   Collection Time: 03/03/18  7:55 AM  Result Value Ref Range   TSH 1.83 mIU/L    Comment:           Reference Range .           > or = 20 Years  0.40-4.50 .                Pregnancy Ranges           First trimester  0.26-2.66           Second trimester   0.55-2.73           Third trimester    0.43-2.91   T4, free     Status: None   Collection Time: 03/03/18  7:55 AM  Result Value Ref Range   Free T4 1.2 0.8 - 1.8 ng/dL  Basic Metabolic Panel (BMET)     Status: None   Collection Time: 03/03/18  7:55 AM  Result Value Ref Range   Glucose, Bld 86 65 - 99 mg/dL    Comment: .            Fasting reference interval .    BUN 15 7 - 25 mg/dL   Creat 0.91 0.50 - 1.10 mg/dL   BUN/Creatinine Ratio NOT APPLICABLE 6 - 22 (calc)   Sodium 138 135 - 146 mmol/L   Potassium 4.6 3.5 - 5.3 mmol/L   Chloride 105 98 - 110 mmol/L   CO2 22 20 - 32 mmol/L   Calcium 9.7 8.6 - 10.2 mg/dL  Vitamin D (25 hydroxy)     Status: None   Collection Time: 03/03/18  7:55 AM  Result Value Ref Range   Vit D, 25-Hydroxy 47 30 - 100 ng/mL    Comment: Vitamin D Status         25-OH Vitamin D: . Deficiency:                    <20 ng/mL Insufficiency:             20 - 29 ng/mL Optimal:                 > or = 30 ng/mL . For 25-OH Vitamin D testing on patients on  D2-supplementation and patients for whom quantitation  of D2 and D3 fractions is required, the QuestAssureD(TM) 25-OH VIT D, (D2,D3), LC/MS/MS is recommended: order  code [phone] (patients >19yrs). . For more information on this test, go to: http://education.[facility].com/faq/FAQ163 (This link is being provided for  informational/educational purposes only.)   CBC w/Diff     Status: Abnormal   Collection Time: 03/03/18  7:55 AM  Result Value Ref Range   WBC 7.8 3.8 - 10.8 Thousand/uL   RBC 3.86 3.80 - 5.10 Million/uL   Hemoglobin 11.9 11.7 - 15.5 g/dL   HCT  34.0 (L) 35.0 - 45.0 %   MCV 88.1 80.0 - 100.0 fL   MCH 30.8 27.0 - 33.0 pg   MCHC 35.0 32.0 - 36.0 g/dL   RDW 12.7 11.0 - 15.0 %   Platelets 313 140 - 400 Thousand/uL   MPV 11.3 7.5 - 12.5 fL   Neutro Abs 5,039 1,500 - 7,800 cells/uL   Lymphs Abs 2,012 850 - 3,900 cells/uL   WBC mixed population 569 200 - 950 cells/uL   Eosinophils Absolute 117 15 - 500 cells/uL   Basophils Absolute 62 0 - 200 cells/uL   Neutrophils Relative % 64.6 %   Total Lymphocyte 25.8 %   Monocytes Relative 7.3 %   Eosinophils Relative 1.5 %   Basophils Relative 0.8 %  Urinalysis, Routine w reflex microscopic     Status: Abnormal   Collection Time: 03/03/18  7:55 AM  Result Value Ref Range   Specific Gravity, UA 1.011 1.005 - 1.030   pH, UA 5.0 5.0 - 7.5   Color, UA Yellow Yellow   Appearance Ur Clear Clear   Leukocytes, UA Negative Negative  Protein, UA Negative Negative/Trace   Glucose, UA Negative Negative   Ketones, UA Negative Negative   RBC, UA Trace (A) Negative   Bilirubin, UA Negative Negative   Urobilinogen, Ur 0.2 0.2 - 1.0 mg/dL   Nitrite, UA Negative Negative   Microscopic Examination See below:     Comment: Microscopic was indicated and was performed.  Hepatitis B surface antibody     Status: None   Collection Time: 03/03/18  7:55 AM  Result Value Ref Range   Hepatitis B-Post 79 > OR = 10 mIU/mL    Comment: . Patient has immunity to hepatitis B virus. . For additional information, please refer to http://education.[facility].com/faq/FAQ105 (This link is being provided for informational/ educational purposes only).   Iron, TIBC and Ferritin Panel     Status: None   Collection Time: 03/03/18  7:55 AM  Result Value Ref Range   Iron 141 40 - 190 mcg/dL   TIBC 360 250 - 450 mcg/dL (calc)   %SAT 39 11 - 50 % (calc)   Ferritin 40 10 - 154 ng/mL  Microscopic Examination     Status: None   Collection Time: 03/03/18  7:55 AM  Result Value Ref Range   WBC, UA None seen 0 - 5  /hpf   RBC, UA 0-2 0 - 2 /hpf   Epithelial Cells (non renal) 0-10 0 - 10 /hpf   Casts None seen None seen /lpf   Mucus, UA Present Not Estab.   Bacteria, UA None seen None seen/Few   Objective  Body mass index is 31.52 kg/m. Wt Readings from Last 3 Encounters:  03/05/18 166 lb 12.8 oz (75.7 kg)  02/25/18 167 lb (75.8 kg)  10/27/17 160 lb (72.6 kg)   Temp Readings from Last 3 Encounters:  03/05/18 98.4 F (36.9 C) (Oral)  02/25/18 98.3 F (36.8 C) (Oral)  10/27/17 98.8 F (37.1 C) (Oral)   BP Readings from Last 3 Encounters:  03/05/18 120/70  02/25/18 110/74  10/27/17 120/71   Pulse Readings from Last 3 Encounters:  03/05/18 100  02/25/18 83  10/27/17 87    Physical Exam  Constitutional: She is oriented to person, place, and time. Vital signs are normal. She appears well-developed and well-nourished. She is cooperative.  HENT:  Head: Normocephalic and atraumatic.  Nose: Right sinus exhibits no maxillary sinus tenderness and no frontal sinus tenderness. Left sinus exhibits no maxillary sinus tenderness and no frontal sinus tenderness.  Mouth/Throat: Oropharynx is clear and moist and mucous membranes are normal.  Eyes: Pupils are equal, round, and reactive to light. Conjunctivae are normal.  Cardiovascular: Normal rate, regular rhythm and normal heart sounds.  Pulmonary/Chest: Effort normal and breath sounds normal. Right breast exhibits mass. Right breast exhibits no inverted nipple, no nipple discharge, no skin change and no tenderness. Left breast exhibits no inverted nipple, no mass, no nipple discharge, no skin change and no tenderness. No breast swelling, tenderness, discharge or bleeding. Breasts are symmetrical.    Neurological: She is alert and oriented to person, place, and time. Gait normal.  Skin: Skin is warm and dry.     hidranitis arms  Eczema improved  RUB nevi with brown in middle and red surrounding   Psychiatric: She has a normal mood and affect.  Her speech is normal and behavior is normal. Judgment and thought content normal. Cognition and memory are normal.  Nursing note and vitals reviewed.   Assessment   1. Annual  2. RUB brown nevus with redness  3. Right breast mass 2-3 oclock  4. URI with nasal congestion  5. Hidradenitis left axilla  6. HM Plan   1.  rec exercise to lose and healthy diet choices  Breast exam today referred right mammo and [place]  2. Expedite dermatology to disc eczema, hidradenitis, mole rub 3. See above #1  4. otc meds ns, flonase, zyrtec, prn nsaid  5. Doxy bid x 1 week, warm compress 6.  Had flu shot  Tdap UTD  Hep B immune   Reviewed labs was on cycle with last urine   Pap get records [facility] obtained had Dr. 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 Kellie Moor excised 05/19/18 neg margins     Provider: Dr. Olivia Mackie McLean-Scocuzza-Internal Medicine

## 2018-03-05 NOTE — Patient Instructions (Addendum)
Let me know if the left breast spot is not improved in the next 1 month  Dermatology asap  Schedule mammogram and ultrasound at [facility]  Try nasal saline 2 sprays then flonase max 2 sprays     Exercising to Lose Weight Exercising can help you to lose weight. In order to lose weight through exercise, you need to do vigorous-intensity exercise. You can tell that you are exercising with vigorous intensity if you are breathing very hard and fast and cannot hold a conversation while exercising. Moderate-intensity exercise helps to maintain your current weight. You can tell that you are exercising at a moderate level if you have a higher heart rate and faster breathing, but you are still able to hold a conversation. How often should I exercise? Choose an activity that you enjoy and set realistic goals. Your health care provider can help you to make an activity plan that works for you. Exercise regularly as directed by your health care provider. This may include:  Doing resistance training twice each week, such as: ? Push-ups. ? Sit-ups. ? Lifting weights. ? Using resistance bands.  Doing a given intensity of exercise for a given amount of time. Choose from these options: ? 150 minutes of moderate-intensity exercise every week. ? 75 minutes of vigorous-intensity exercise every week. ? A mix of moderate-intensity and vigorous-intensity exercise every week.  Children, pregnant women, people who are out of shape, people who are overweight, and older adults may need to consult a health care provider for individual recommendations. If you have any sort of medical condition, be sure to consult your health care provider before starting a new exercise program. What are some activities that can help me to lose weight?  Walking at a rate of at least 4.5 miles an hour.  Jogging or running at a rate of 5 miles per hour.  Biking at a rate of at least 10 miles per hour.  Lap swimming.  Roller-skating  or in-line skating.  Cross-country skiing.  Vigorous competitive sports, such as football, basketball, and soccer.  Jumping rope.  Aerobic dancing. How can I be more active in my day-to-day activities?  Use the stairs instead of the elevator.  Take a walk during your lunch break.  If you drive, park your car farther away from work or school.  If you take public transportation, get off one stop early and walk the rest of the way.  Make all of your phone calls while standing up and walking around.  Get up, stretch, and walk around every 30 minutes throughout the day. What guidelines should I follow while exercising?  Do not exercise so much that you hurt yourself, feel dizzy, or get very short of breath.  Consult your health care provider prior to starting a new exercise program.  Wear comfortable clothes and shoes with good support.  Drink plenty of water while you exercise to prevent dehydration or heat stroke. Body water is lost during exercise and must be replaced.  Work out until you breathe faster and your heart beats faster. This information is not intended to replace advice given to you by your health care provider. Make sure you discuss any questions you have with your health care provider. Document Released: 11/15/2010 Document Revised: 03/20/2016 Document Reviewed: 03/16/2014 Elsevier Interactive Patient Education  2018 [place].    Hidradenitis Suppurativa Hidradenitis suppurativa is a long-term (chronic) skin disease that starts with blocked sweat glands or hair follicles. Bacteria may grow in these blocked openings  of your skin. Hidradenitis suppurativa is like a severe form of acne that develops in areas of your body where acne would be unusual. It is most likely to affect the areas of your body where skin rubs against skin and becomes moist. This includes your:  Underarms.  Groin.  Genital areas.  Buttocks.  Upper  thighs.  Breasts.  Hidradenitis suppurativa may start out with small pimples. The pimples can develop into deep sores that break open (rupture) and drain pus. Over time your skin may thicken and become scarred. Hidradenitis suppurativa cannot be passed from person to person. What are the causes? The exact cause of hidradenitis suppurativa is not known. This condition may be due to:  Female and female hormones. The condition is rare before and after puberty.  An overactive body defense system (immune system). Your immune system may overreact to the blocked hair follicles or sweat glands and cause swelling and pus-filled sores.  What increases the risk? You may have a higher risk of hidradenitis suppurativa if you:  Are a woman.  Are between ages 7 and 84.  Have a family history of hidradenitis suppurativa.  Have a personal history of acne.  Are overweight.  Smoke.  Take the drug lithium.  What are the signs or symptoms? The first signs of an outbreak are usually painful skin bumps that look like pimples. As the condition progresses:  Skin bumps may get bigger and grow deeper into the skin.  Bumps under the skin may rupture and drain smelly pus.  Skin may become itchy and infected.  Skin may thicken and scar.  Drainage may continue through tunnels under the skin (fistulas).  Walking and moving your arms can become painful.  How is this diagnosed? Your health care provider may diagnose hidradenitis suppurativa based on your medical history and your signs and symptoms. A physical exam will also be done. You may need to see a health care provider who specializes in skin diseases (dermatologist). You may also have tests done to confirm the diagnosis. These can include:  Swabbing a sample of pus or drainage from your skin so it can be sent to the lab and tested for infection.  Blood tests to check for infection.  How is this treated? The same treatment will not work for  everybody with hidradenitis suppurativa. Your treatment will depend on how severe your symptoms are. You may need to try several treatments to find what works best for you. Part of your treatment may include cleaning and bandaging (dressing) your wounds. You may also have to take medicines, such as the following:  Antibiotics.  Acne medicines.  Medicines to block or suppress the immune system.  A diabetes medicine (metformin) is sometimes used to treat this condition.  For women, birth control pills can sometimes help relieve symptoms.  You may need surgery if you have a severe case of hidradenitis suppurativa that does not respond to medicine. Surgery may involve:  Using a laser to clear the skin and remove hair follicles.  Opening and draining deep sores.  Removing the areas of skin that are diseased and scarred.  Follow these instructions at home:  Learn as much as you can about your disease, and work closely with your health care providers.  Take medicines only as directed by your health care provider.  If you were prescribed an antibiotic medicine, finish it all even if you start to feel better.  If you are overweight, losing weight may be very helpful. Try  to reach and maintain a healthy weight.  Do not use any tobacco products, including cigarettes, chewing tobacco, or electronic cigarettes. If you need help quitting, ask your health care provider.  Do not shave the areas where you get hidradenitis suppurativa.  Do not wear deodorant.  Wear loose-fitting clothes.  Try not to overheat and get sweaty.  Take a daily bleach bath as directed by your health care provider. ? Fill your bathtub halfway with water. ? Pour in  cup of unscented household bleach. ? Soak for 5-10 minutes.  Cover sore areas with a warm, clean washcloth (compress) for 5-10 minutes. Contact a health care provider if:  You have a flare-up of hidradenitis suppurativa.  You have chills or a  fever.  You are having trouble controlling your symptoms at home. This information is not intended to replace advice given to you by your health care provider. Make sure you discuss any questions you have with your health care provider. Document Released: 05/27/2004 Document Revised: 03/20/2016 Document Reviewed: 01/13/2014 Elsevier Interactive Patient Education  2018 [employer].

## 2018-03-05 NOTE — Progress Notes (Signed)
Pre visit review using our clinic review tool, if applicable. No additional management support is needed unless otherwise documented below in the visit note. 

## 2018-03-24 IMAGING — CT CT ABD-PELV W/ CM
2 of 4 series · 15 of 46 positions shown, 17 images · IV contrast (APPLIED)
Comparison: Pelvic ultrasound performed 12/15/2016

CLINICAL DATA: Acute onset of lower abdominal pain and nausea.

EXAM:
CT ABDOMEN AND PELVIS WITH CONTRAST
TECHNIQUE: Multidetector CT imaging of the abdomen and pelvis was performed
using the standard protocol following bolus administration of
intravenous contrast.
CONTRAST:  100mL 788STH-2FF IOPAMIDOL (788STH-2FF) INJECTION 61%

[Series 2: routine abd/pel with · axial · 0.77mm/px · z∈[-898,-478]mm · 12 of 94 slices shown, 14 images]
[im 5/94  soft-tissue]
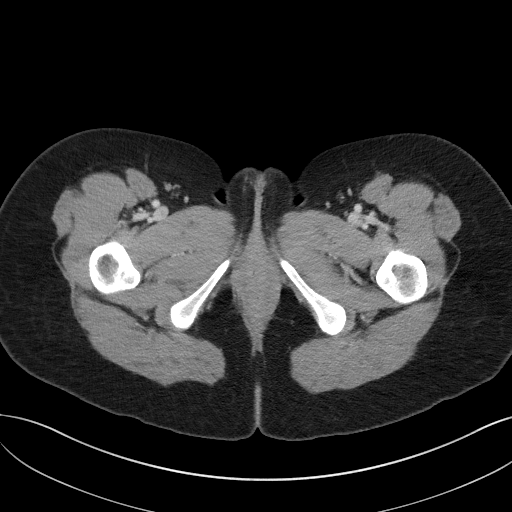
[im 5/94  bone]
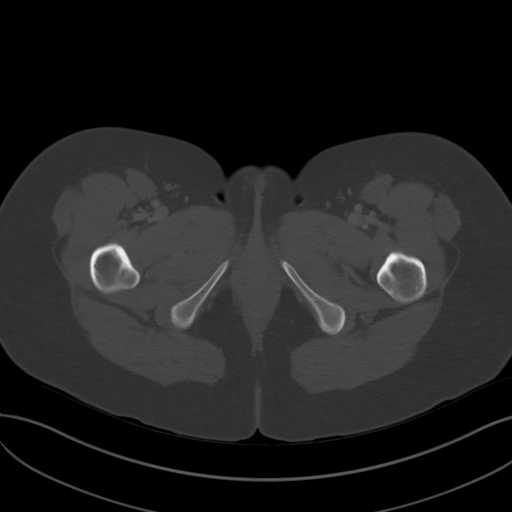
[im 13/94  soft-tissue]
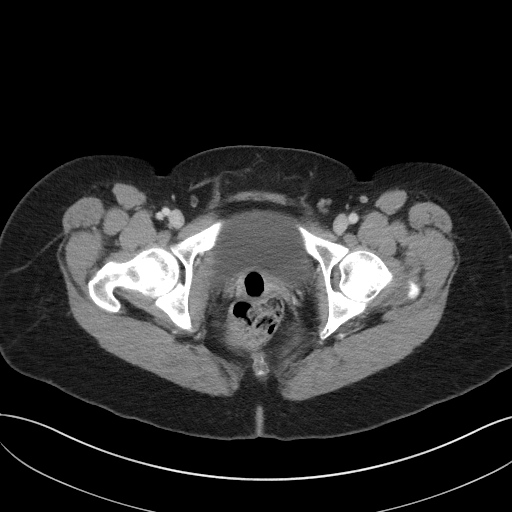
[im 22/94  soft-tissue]
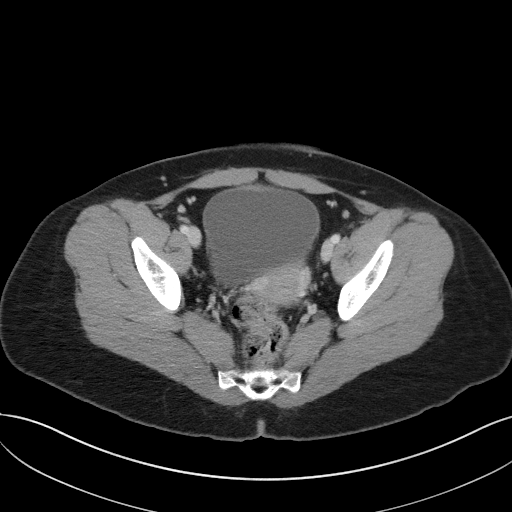
[im 30/94  soft-tissue]
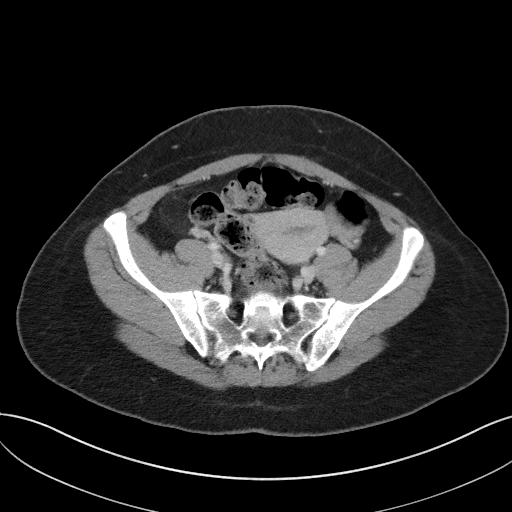
[im 34/94  soft-tissue]
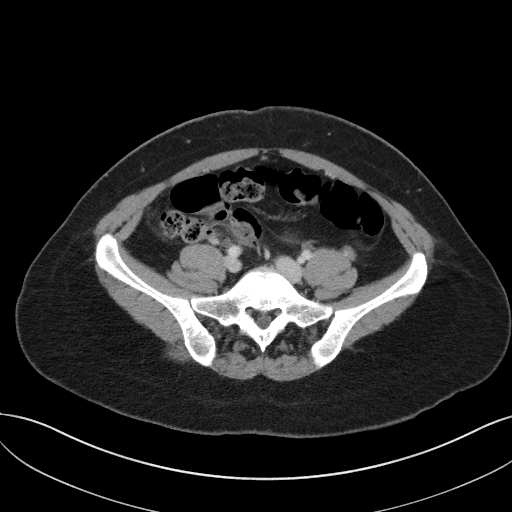
[im 43/94  soft-tissue]
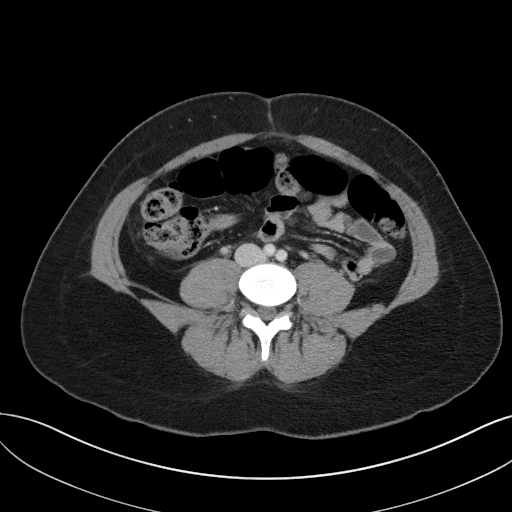
[im 51/94  soft-tissue]
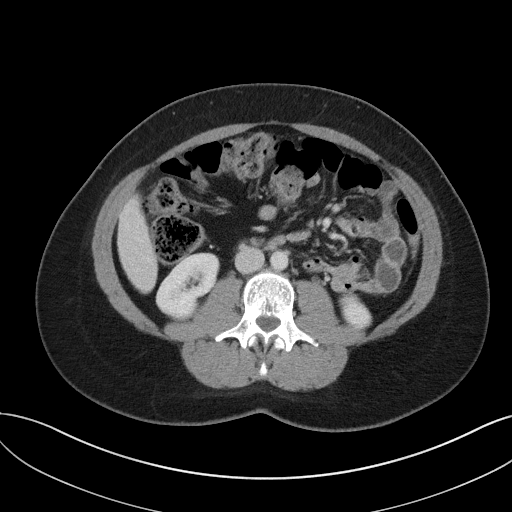
[im 60/94  soft-tissue]
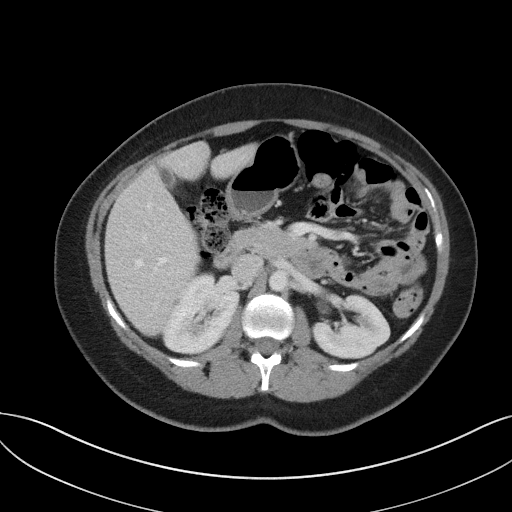
[im 64/94  soft-tissue]
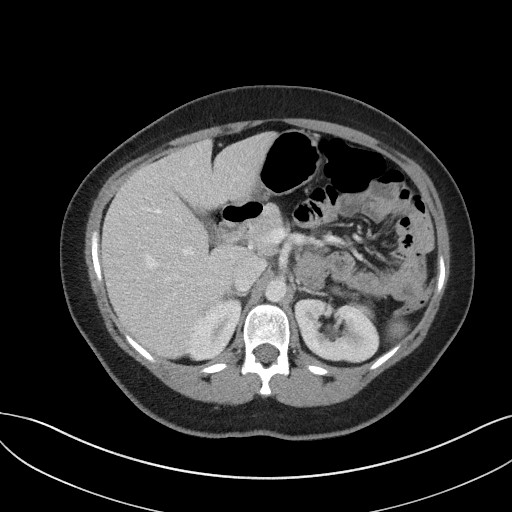
[im 64/94  bone]
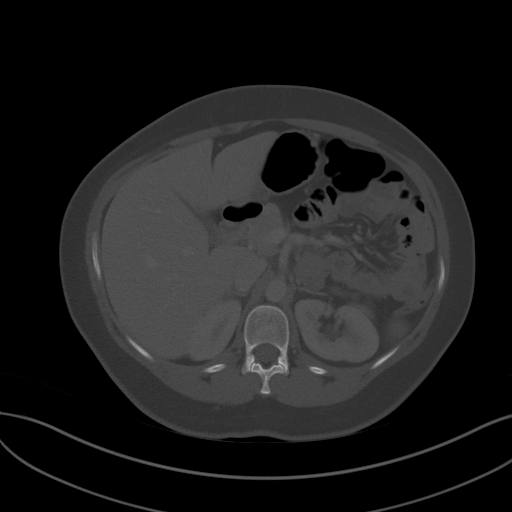
[im 72/94  soft-tissue]
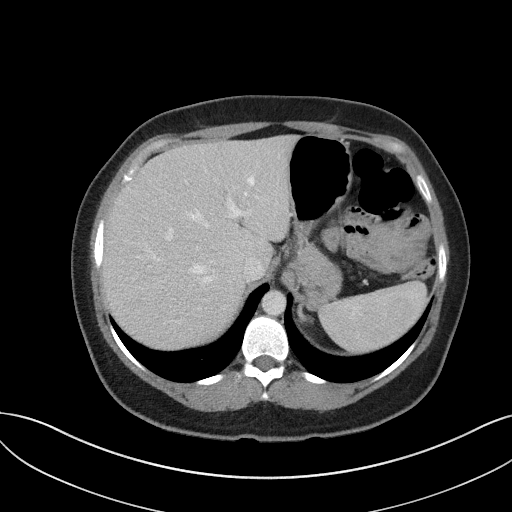
[im 81/94  soft-tissue]
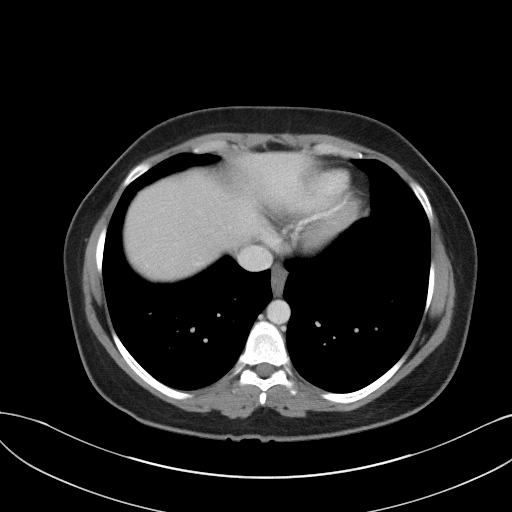
[im 89/94  soft-tissue]
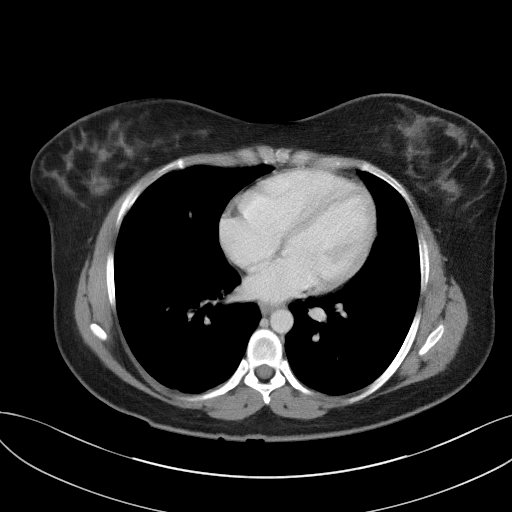

[Series 5: coronal st · coronal · 0.70mm/px · 3 of 80 slices shown]
[im 27/80  soft-tissue]
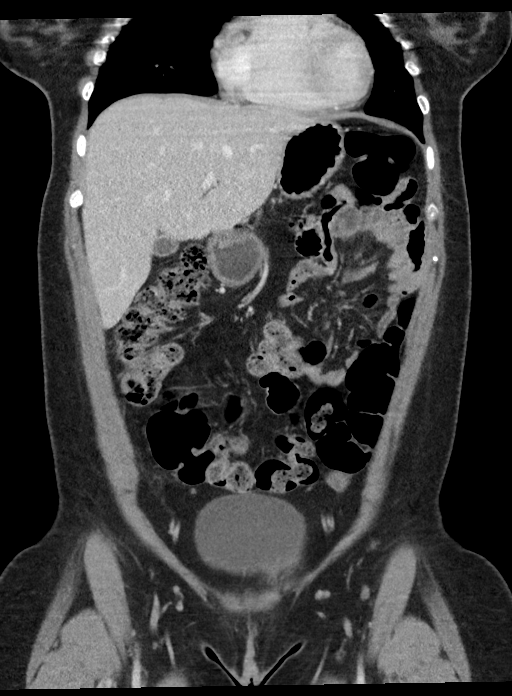
[im 36/80  soft-tissue]
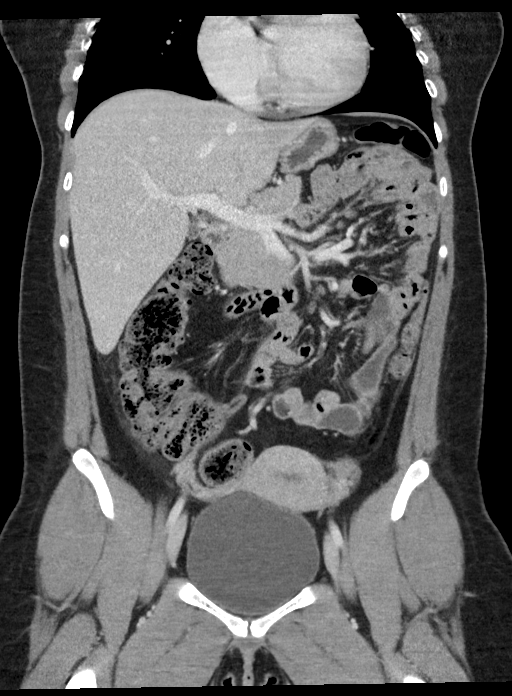
[im 44/80  soft-tissue]
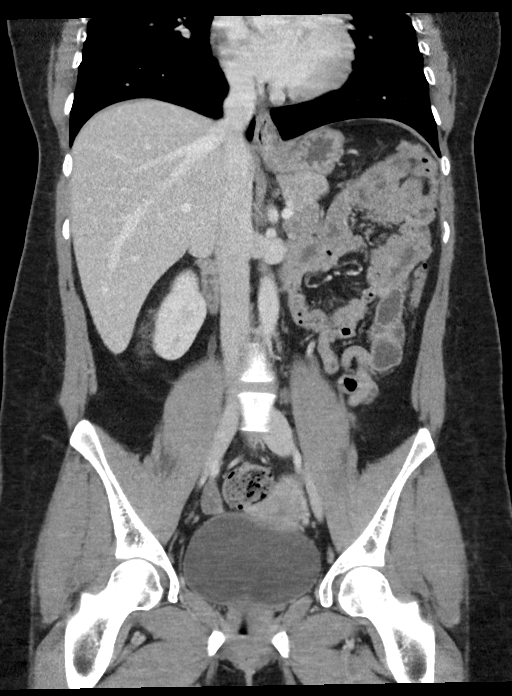

[15 of 46 positions shown; findings below may reference images not displayed]

FINDINGS: Lower chest: The visualized lung bases are grossly clear. The
visualized portions of the mediastinum are unremarkable.

Hepatobiliary: The liver is unremarkable in appearance. The
gallbladder is unremarkable in appearance. The common bile duct
remains normal in caliber.

Pancreas: The pancreas is within normal limits.

Spleen: The spleen is unremarkable in appearance.

Adrenals/Urinary Tract: The adrenal glands are unremarkable in
appearance. The kidneys are within normal limits. There is no
evidence of hydronephrosis. No renal or ureteral stones are
identified. No perinephric stranding is seen.

Stomach/Bowel: The stomach is unremarkable in appearance. The small
bowel is within normal limits. The appendix is normal in caliber,
without evidence of appendicitis. The colon is unremarkable in
appearance.

Vascular/Lymphatic: The abdominal aorta is unremarkable in
appearance. The inferior vena cava is grossly unremarkable. No
retroperitoneal lymphadenopathy is seen. No pelvic sidewall
lymphadenopathy is identified.

Reproductive: The bladder is mildly distended and within normal
limits. The uterus is grossly unremarkable in appearance. A tampon
is noted at the vagina. The ovaries are relatively symmetric. No
suspicious adnexal masses are seen.

Other: Minimal nonspecific omental haziness is noted at the right
lower quadrant.

Musculoskeletal: No acute osseous abnormalities are identified. The
visualized musculature is unremarkable in appearance.
IMPRESSION: 1. No definite acute abnormality seen within the abdomen or pelvis.
2. Minimal nonspecific omental haziness noted at the right lower
quadrant. Adjacent bowel is unremarkable in appearance.

## 2018-05-07 ENCOUNTER — Encounter: Payer: Self-pay | Admitting: Internal Medicine

## 2018-05-17 ENCOUNTER — Encounter: Payer: Self-pay | Admitting: Internal Medicine

## 2018-05-17 ENCOUNTER — Telehealth: Payer: Self-pay | Admitting: *Deleted

## 2018-05-17 NOTE — Telephone Encounter (Signed)
Copied from [place] [id number]. Topic: Referral - Status >> May 17, 2018  1:04 PM Yvette Rack wrote: Reason for CRM: Pt returned call to office. Pt states she received call regarding a mammogram but she does not know who she should be calling to get an appt scheduled. Pt requests call back. Cb# [phone]

## 2018-05-21 ENCOUNTER — Ambulatory Visit: Payer: BC Managed Care – PPO | Admitting: Internal Medicine

## 2018-05-26 ENCOUNTER — Ambulatory Visit
Admission: RE | Admit: 2018-05-26 | Discharge: 2018-05-26 | Disposition: A | Discharge status: Home / Self Care | Payer: BC Managed Care – PPO | Source: Ambulatory Visit | Attending: Internal Medicine | Admitting: Internal Medicine

## 2018-05-26 DIAGNOSIS — N6310 Unspecified lump in the right breast, unspecified quadrant: Secondary | ICD-10-CM | POA: Diagnosis not present

## 2018-07-05 IMAGING — US US BREAST*R* LIMITED INC AXILLA
1 series · 14 of 19 positions shown · non-contrast
Comparison: Previous exam(s).

CLINICAL DATA: Mastitis.  Evaluate for abscess.

EXAM:
ULTRASOUND OF THE RIGHT BREAST

[Series 1: us breast*right* limited inc axilla · 0.09mm/px · 14 of 19 slices shown]
[im 1/19]
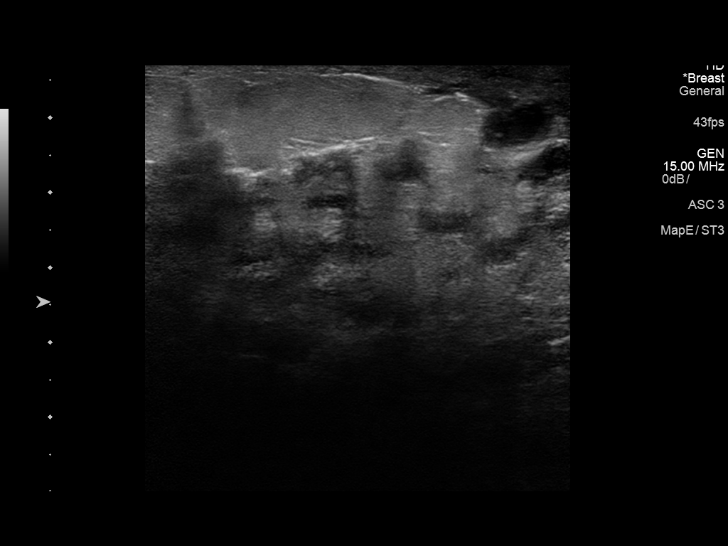
[im 3/19]
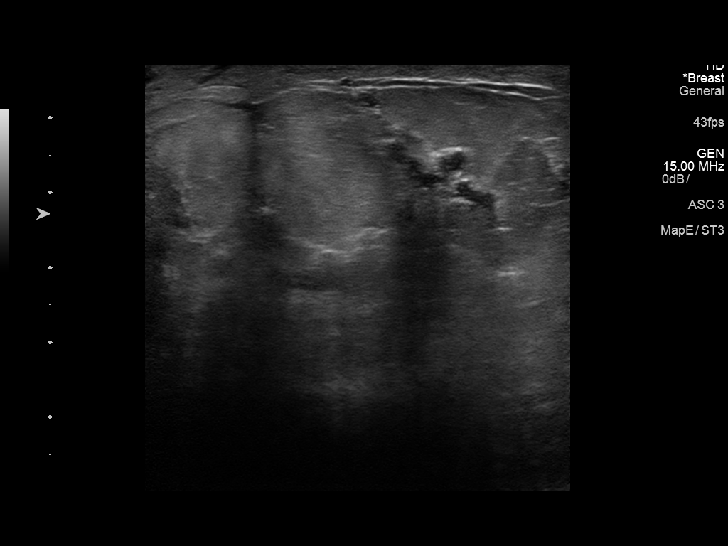
[im 4/19]
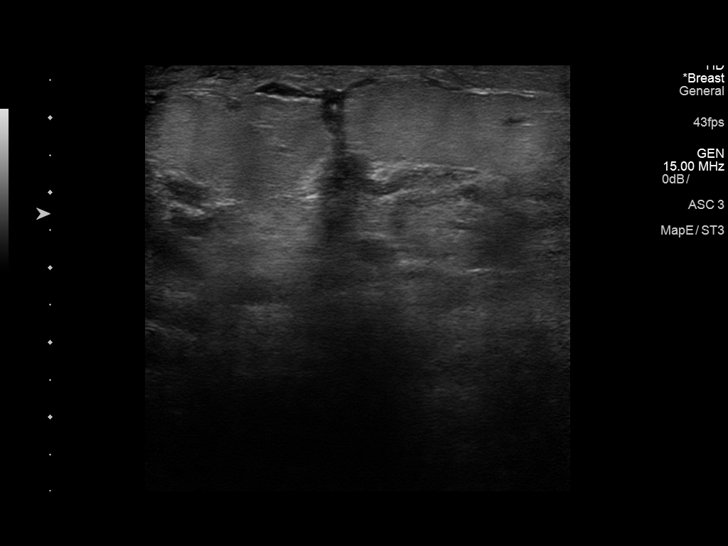
[im 5/19]
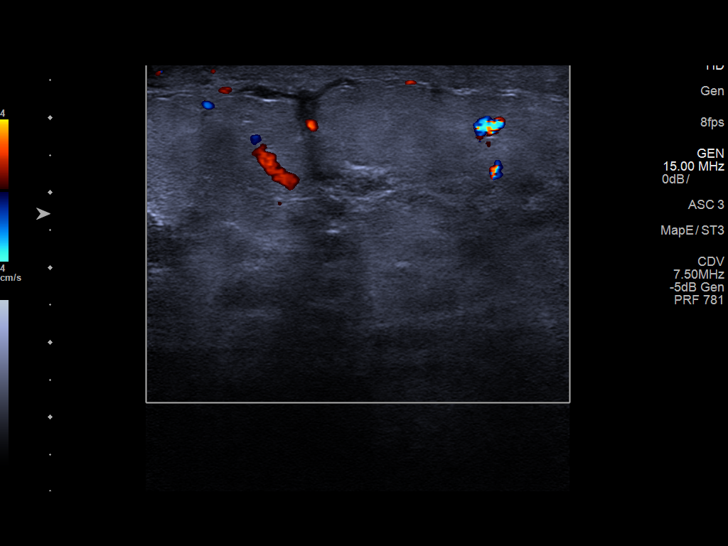
[im 7/19]
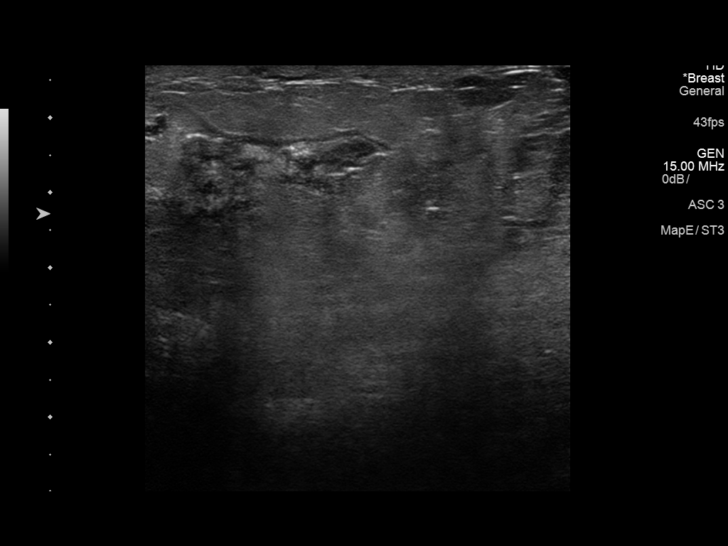
[im 8/19]
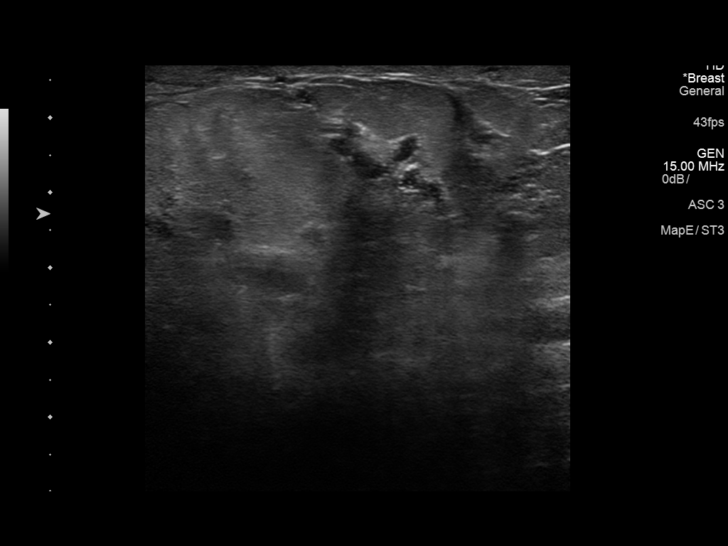
[im 9/19]
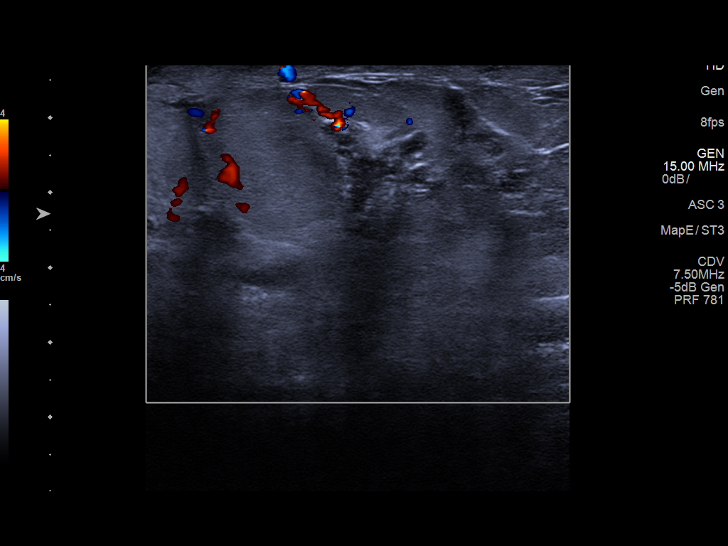
[im 11/19]
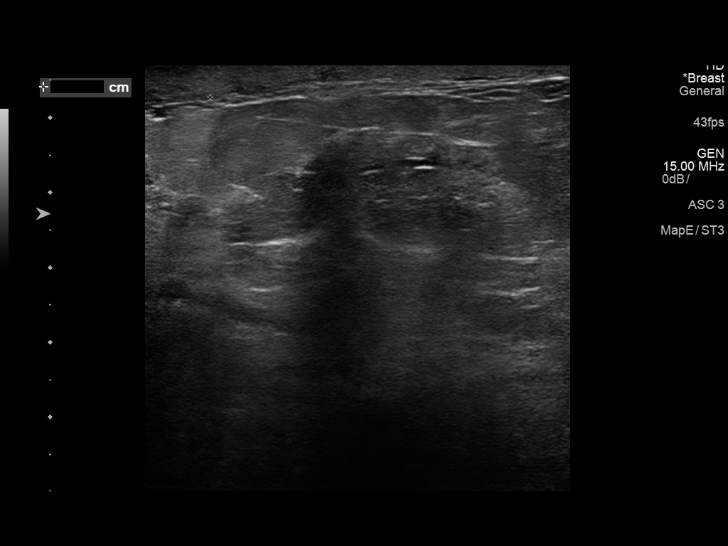
[im 12/19]
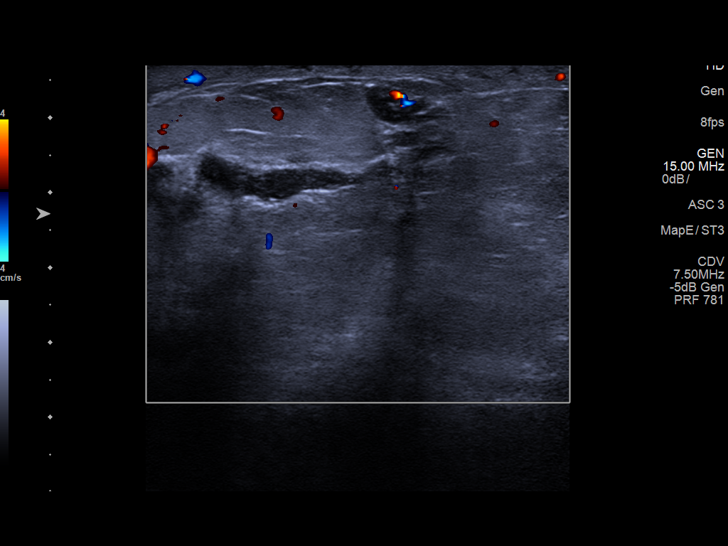
[im 13/19]
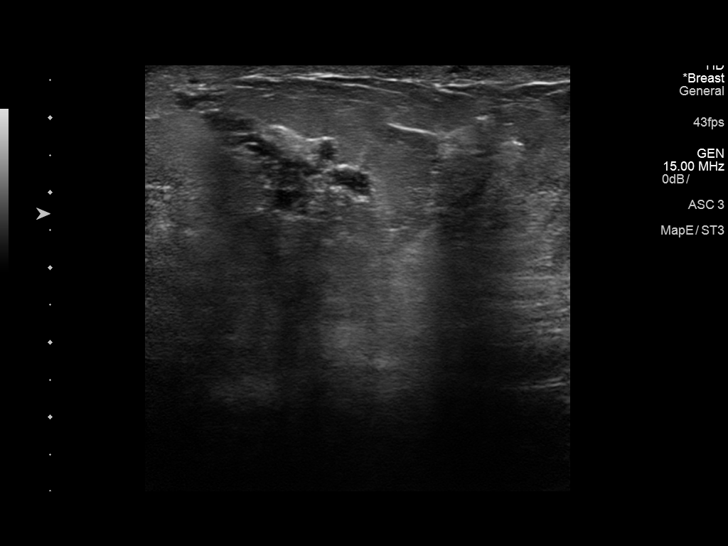
[im 15/19]
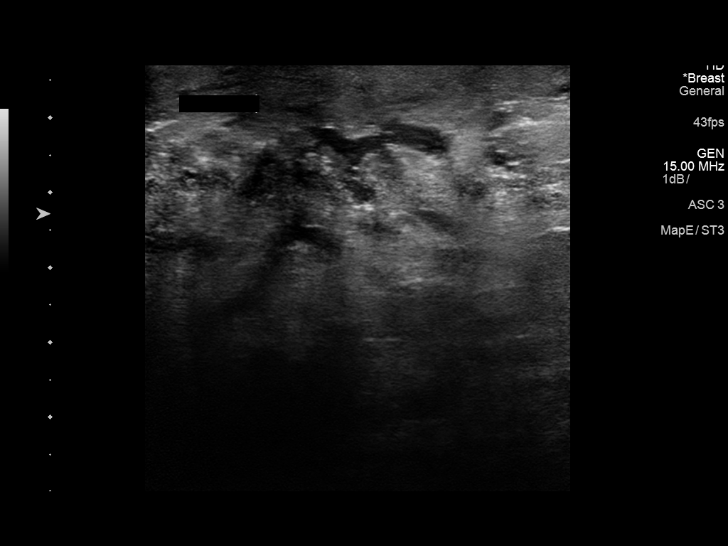
[im 16/19]
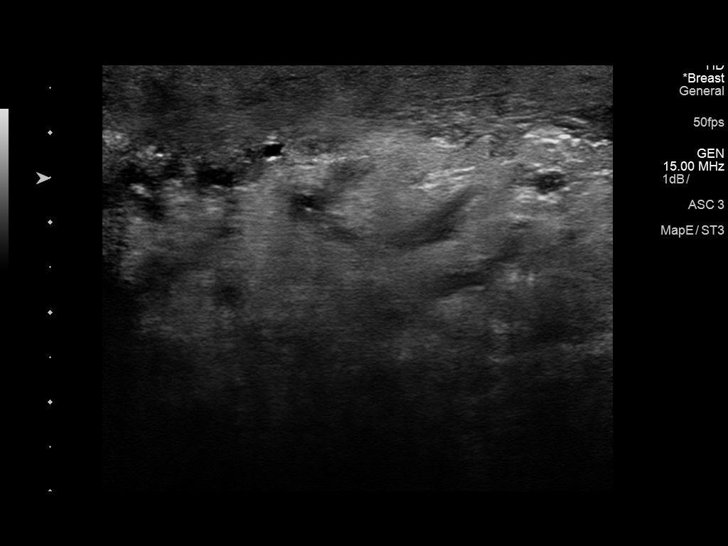
[im 17/19]
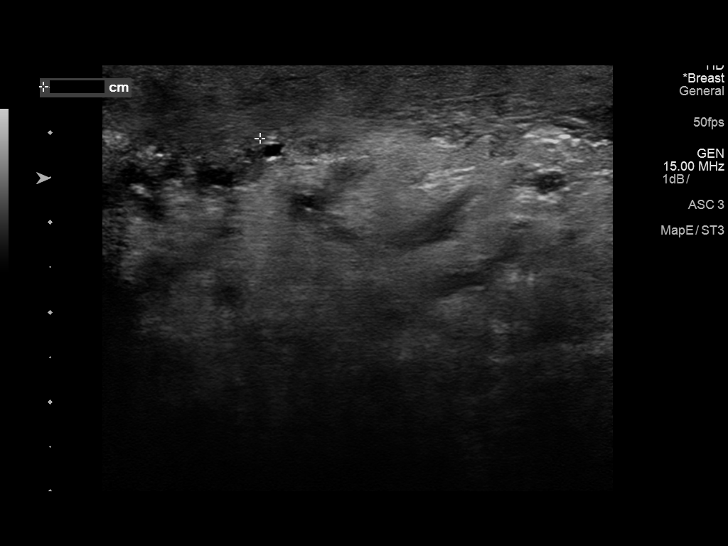
[im 19/19]
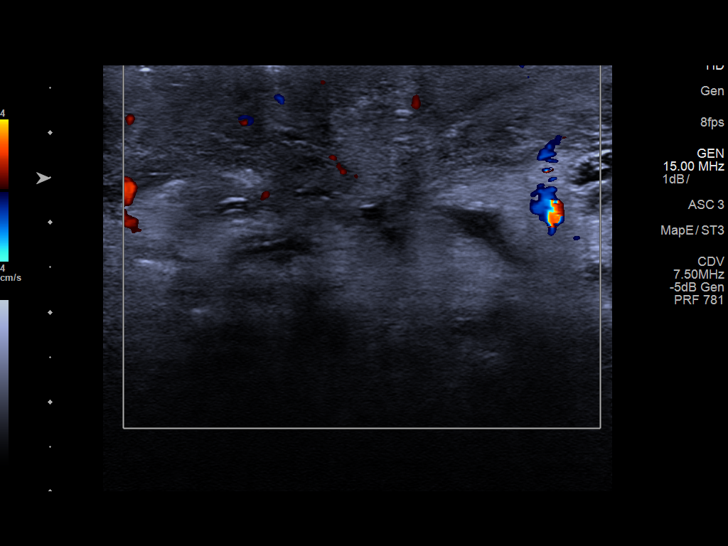

[14 of 19 positions shown; findings below may reference images not displayed]

FINDINGS: Targeted ultrasound is performed, showing skin thickening measuring
up to 11 mm. The breast appears diffusely edematous. No drainable
fluid collection is identified on provided images.
IMPRESSION: Skin thickening and breast edema without focal abscess. No mass
identified on provided images.

RECOMMENDATION:
Recommend treatment with antibiotics with follow-up in the [REDACTED] to ensure complete resolution. While the history sounds like
mastitis is the most likely etiology, inflammatory breast cancer can
have a similar appearance. As a result, the patient needs to be
followed to complete resolution of symptoms and skin thickening.

I have discussed the findings and recommendations with the patient.
Results were also provided in writing at the conclusion of the
visit. If applicable, a reminder letter will be sent to the patient
regarding the next appointment.

BI-RADS CATEGORY  3: Probably benign.

## 2018-07-27 ENCOUNTER — Ambulatory Visit: Payer: BC Managed Care – PPO | Admitting: Internal Medicine

## 2018-07-27 ENCOUNTER — Encounter: Payer: Self-pay | Admitting: Internal Medicine

## 2018-07-27 VITALS — BP 122/68 | HR 89 | Temp 98.5°F | Ht 61.0 in | Wt 166.8 lb

## 2018-07-27 DIAGNOSIS — Z13220 Encounter for screening for lipoid disorders: Secondary | ICD-10-CM

## 2018-07-27 DIAGNOSIS — Z309 Encounter for contraceptive management, unspecified: Secondary | ICD-10-CM | POA: Diagnosis not present

## 2018-07-27 DIAGNOSIS — J069 Acute upper respiratory infection, unspecified: Secondary | ICD-10-CM | POA: Diagnosis not present

## 2018-07-27 DIAGNOSIS — N6001 Solitary cyst of right breast: Secondary | ICD-10-CM | POA: Diagnosis not present

## 2018-07-27 DIAGNOSIS — Z1159 Encounter for screening for other viral diseases: Secondary | ICD-10-CM

## 2018-07-27 DIAGNOSIS — E559 Vitamin D deficiency, unspecified: Secondary | ICD-10-CM

## 2018-07-27 DIAGNOSIS — Z1389 Encounter for screening for other disorder: Secondary | ICD-10-CM

## 2018-07-27 DIAGNOSIS — L309 Dermatitis, unspecified: Secondary | ICD-10-CM | POA: Diagnosis not present

## 2018-07-27 DIAGNOSIS — R7303 Prediabetes: Secondary | ICD-10-CM

## 2018-07-27 DIAGNOSIS — Z1329 Encounter for screening for other suspected endocrine disorder: Secondary | ICD-10-CM

## 2018-07-27 DIAGNOSIS — Z0184 Encounter for antibody response examination: Secondary | ICD-10-CM

## 2018-07-27 DIAGNOSIS — Z0000 Encounter for general adult medical examination without abnormal findings: Secondary | ICD-10-CM

## 2018-07-27 MED ORDER — LEVONORGEST-ETH ESTRAD 91-DAY 0.15-0.03 &0.01 MG PO TABS: 1.0000 | ORAL_TABLET | Freq: Every day | ORAL | 4 refills | Status: DC

## 2018-07-27 MED ORDER — HYDROCORTISONE 2.5 % EX CREA: TOPICAL_CREAM | Freq: Two times a day (BID) | CUTANEOUS | 0 refills | Status: DC

## 2018-07-27 NOTE — Progress Notes (Signed)
Pre visit review using our clinic review tool, if applicable. No additional management support is needed unless otherwise documented below in the visit note. 

## 2018-07-27 NOTE — Progress Notes (Signed)
Chief Complaint  Patient presents with  . Follow-up   F/u  1. C/o skin irritation around 04/2018 invasive melanoma excision site bandaids and paper take irritated and it is itchy dermatology appt in 2 weeks  2. Right breast cyst stable and due mammo age 27 per report and pt states not changing  3. URI today mild son is also sick  4. Prediabetes A1C 5.7 disc exercise to lose    Review of Systems  Constitutional: Negative for weight loss.  HENT: Negative for hearing loss.   Eyes: Negative for blurred vision.  Respiratory: Negative for shortness of breath.   Cardiovascular: Negative for chest pain.  Skin: Positive for itching and rash.  Neurological: Negative for headaches.  Psychiatric/Behavioral: Negative for depression.   Past Medical History:  Diagnosis Date  . Anemia   . Eczema   . Eczema   . Endometriosis   . History of ovarian cyst   . Migraine    On continuous BC for prevention  . Prediabetes    Past Surgical History:  Procedure Laterality Date  . ABLATION ON ENDOMETRIOSIS  2012  . CESAREAN SECTION N/A 04/12/2017   Procedure: CESAREAN SECTION;  Surgeon: Gae Dry, MD;  Location: [facility] ORS;  Service: Obstetrics;  Laterality: N/A;  . LAPAROSCOPIC OVARIAN CYSTECTOMY Right 2012   Family History  Problem Relation Age of Onset  . Hypertension Mother   . Birth defects Mother        Congential heart condition   . Endometriosis Mother   . Diabetes Mother   . Other Mother        endometrosis  . Cancer Father        Prostate  . Diabetes Paternal Grandfather   . Stroke Paternal Grandfather    Social History   Socioeconomic History  . Marital status: Married    Spouse name: Jelissa Espiritu - Fiance  . Number of children: Not on file  . Years of education: 72  . Highest education level: Not on file  Occupational History  . 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 Financial resource  strain: Not on file  . Food insecurity:    Worry: Not on file    Inability: Not on file  . Transportation needs:    Medical: Not on file    Non-medical: Not on file  Tobacco Use  . Smoking status: Never Smoker  . Smokeless tobacco: Never Used  Substance and Sexual Activity  . Alcohol use: No    Alcohol/week: 0.0 standard drinks  . Drug use: No  . Sexual activity: Not Currently    Partners: Male    Birth control/protection: None  Lifestyle  . Physical activity:    Days per week: Not on file    Minutes per session: Not on file  . Stress: Not on file  Relationships  . Social connections:    Talks on phone: Not on file    Gets together: Not on file    Attends religious service: Not on file    Active member of club or organization: Not on file    Attends meetings of clubs or organizations: Not on file    Relationship status: Not on file  . Intimate partner violence:    Fear of current or ex partner: Not on file    Emotionally abused: Not on file    Physically abused: Not on file  Forced sexual activity: Not on file  Other Topics Concern  . Not on file  Social History Narrative   Naevia was born and reared in [place], [place]. She graduated from [employer] in 2008 and then went to [employer] and obtained her Bachelors in [employer] in 2012. Married with 1 son had in 2018.  She has a turtle named Crush. 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 She enjoys photography, hanging out with friends. She enjoys action movies. 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      Caffeine- Coffee 1 cup, 1 small can of soda   Current Meds  Medication Sig  . clobetasol ointment (TEMOVATE) 0.05 % APPLY THIN FILM TWICE DAILY TO NON FACIAL SITES UNTIL CLEAR  . Levonorgestrel-Ethinyl Estradiol (AMETHIA,CAMRESE) 0.15-0.03 &0.01 MG tablet Take 1 tablet daily by mouth.  . [DISCONTINUED] doxycycline (VIBRA-TABS)  100 MG tablet Take 1 tablet (100 mg total) by mouth 2 (two) times daily.  . [DISCONTINUED] triamcinolone cream (KENALOG) 0.1 % Apply 1 application topically 2 (two) times daily. As needed. Not face, underarms or private area   Current Facility-Administered Medications for the 07/27/18 encounter (Office Visit) with McLean-Scocuzza, Nino Glow, MD  Medication  . triamcinolone cream (KENALOG) 0.5 %   No Known Allergies No results found for this or any previous visit (from the past 2160 hour(s)). Objective  Body mass index is 31.52 kg/m. Wt Readings from Last 3 Encounters:  07/27/18 166 lb 12.8 oz (75.7 kg)  03/05/18 166 lb 12.8 oz (75.7 kg)  02/25/18 167 lb (75.8 kg)   Temp Readings from Last 3 Encounters:  07/27/18 98.5 F (36.9 C) (Oral)  03/05/18 98.4 F (36.9 C) (Oral)  02/25/18 98.3 F (36.8 C) (Oral)   BP Readings from Last 3 Encounters:  07/27/18 122/68  03/05/18 120/70  02/25/18 110/74   Pulse Readings from Last 3 Encounters:  07/27/18 89  03/05/18 100  02/25/18 83    Physical Exam  Constitutional: She is oriented to person, place, and time. Vital signs are normal. She appears well-developed and well-nourished. She is cooperative.  HENT:  Head: Normocephalic and atraumatic.  Mouth/Throat: Oropharynx is clear and moist and mucous membranes are normal.  Eyes: Pupils are equal, round, and reactive to light. Conjunctivae are normal.  Cardiovascular: Normal rate, regular rhythm and normal heart sounds.  Pulmonary/Chest: Effort normal and breath sounds normal.  Neurological: She is alert and oriented to person, place, and time. Gait normal.  Skin: Skin is warm, dry and intact.     Psychiatric: She has a normal mood and affect. Her speech is normal and behavior is normal. Judgment and thought content normal. Cognition and memory are normal.  Nursing note and vitals reviewed.   Assessment   1. Dermatitis around bx site right upper back  2. Right breast cyst  3. URI    4. Prediabetes  5. HM Plan  1. hc 2.5 bid f/u derm 2. Monitor if worsening consider breast surgery  3. Supportive care  4. Check fasting labs 02/2019  5.  Will get flu shot at work   Tdap UTD  Hep B immune   Reviewed labs was on cycle with last urine   [facility] negative pap no GC/C no HPV testing done -pap due 08/29/2019   Dermatology referred has eczema, RUB, hidradenitis  -saw 05/07/18 right mid back invasive MM 0.7 MM DEPTH Dr.  Isenstein excised 05/19/18 neg margins  -appt in 2 weeks 07/2018 dermatology    Provider: Dr. Olivia Mackie McLean-Scocuzza-Internal Medicine

## 2018-07-27 NOTE — Patient Instructions (Addendum)
Pap due 08/29/2019 due Dr. Kenton Kingfisher   Consider buy electric razor to shave underarms  Or  Change disposable frequently   Ask her about topical clindamycin what she thinks    Exercising to Lose Weight Exercising can help you to lose weight. In order to lose weight through exercise, you need to do vigorous-intensity exercise. You can tell that you are exercising with vigorous intensity if you are breathing very hard and fast and cannot hold a conversation while exercising. Moderate-intensity exercise helps to maintain your current weight. You can tell that you are exercising at a moderate level if you have a higher heart rate and faster breathing, but you are still able to hold a conversation. How often should I exercise? Choose an activity that you enjoy and set realistic goals. Your health care provider can help you to make an activity plan that works for you. Exercise regularly as directed by your health care provider. This may include:  Doing resistance training twice each week, such as: ? Push-ups. ? Sit-ups. ? Lifting weights. ? Using resistance bands.  Doing a given intensity of exercise for a given amount of time. Choose from these options: ? 150 minutes of moderate-intensity exercise every week. ? 75 minutes of vigorous-intensity exercise every week. ? A mix of moderate-intensity and vigorous-intensity exercise every week.  Children, pregnant women, people who are out of shape, people who are overweight, and older adults may need to consult a health care provider for individual recommendations. If you have any sort of medical condition, be sure to consult your health care provider before starting a new exercise program. What are some activities that can help me to lose weight?  Walking at a rate of at least 4.5 miles an hour.  Jogging or running at a rate of 5 miles per hour.  Biking at a rate of at least 10 miles per hour.  Lap swimming.  Roller-skating or in-line  skating.  Cross-country skiing.  Vigorous competitive sports, such as football, basketball, and soccer.  Jumping rope.  Aerobic dancing. How can I be more active in my day-to-day activities?  Use the stairs instead of the elevator.  Take a walk during your lunch break.  If you drive, park your car farther away from work or school.  If you take public transportation, get off one stop early and walk the rest of the way.  Make all of your phone calls while standing up and walking around.  Get up, stretch, and walk around every 30 minutes throughout the day. What guidelines should I follow while exercising?  Do not exercise so much that you hurt yourself, feel dizzy, or get very short of breath.  Consult your health care provider prior to starting a new exercise program.  Wear comfortable clothes and shoes with good support.  Drink plenty of water while you exercise to prevent dehydration or heat stroke. Body water is lost during exercise and must be replaced.  Work out until you breathe faster and your heart beats faster. This information is not intended to replace advice given to you by your health care provider. Make sure you discuss any questions you have with your health care provider. Document Released: 11/15/2010 Document Revised: 03/20/2016 Document Reviewed: 03/16/2014 Elsevier Interactive Patient Education  2018 [employer].   Eczema Eczema is a broad term for a group of skin conditions that cause skin to become rough and inflamed. Each type of eczema has different triggers, symptoms, and treatments. Eczema of any type  is usually itchy and symptoms range from mild to severe. Eczema and its symptoms are not spread from person to person (are not contagious). It can appear on different parts of the body at different times. Your eczema may not look the same as someone else's eczema. What are the types of eczema? Atopic dermatitis This is a long-term (chronic) skin  disease that keeps coming back (recurring). Usual symptoms are dry skin and small, solid pimples that may swell and leak fluid (weep). Contact dermatitis This happens when something irritates the skin and causes a rash. The irritation can come from substances that you are allergic to (allergens), such as poison ivy, chemicals, or medicines that were applied to your skin. Dyshidrotic eczema This is a form of eczema on the hands and feet. It shows up as very itchy, fluid-filled blisters. It can affect people of any age, but is more common before age 55. Hand eczema This causes very itchy areas of skin on the palms and sides of the hands and fingers. This type of eczema is common in industrial jobs where you may be exposed to many different types of irritants. Lichen simplex chronicus This type of eczema occurs when a person constantly scratches one area of the body. Repeated scratching of the area leads to thickened skin (lichenification). Lichen simplex chronicus can occur along with other types of eczema. It is more common in adults, but may be seen in children as well. Nummular eczema This is a common type of eczema. It has no known cause. It typically causes a red, circular, crusty lesion (plaque) that may be itchy. Scratching may become a habit and can cause bleeding. Nummular eczema occurs most often in people of middle-age or older. It most often affects the hands. Seborrheic dermatitis This is a common skin disease that mainly affects the scalp. It may also affect any oily areas of the body, such as the face, sides of nose, eyebrows, ears, eyelids, and chest. It is marked by small scaling and redness of the skin (erythema). This can affect people of all ages. In infants, this condition is known as Chartered certified accountant." Stasis dermatitis This is a common skin disease that usually appears on the legs and feet. It most often occurs in people who have a condition that prevents blood from being pumped through  the veins in the legs (chronic venous insufficiency). Stasis dermatitis is a chronic condition that needs long-term management. How is eczema diagnosed? Your health care provider will examine your skin and review your medical history. He or she may also give you skin patch tests. These tests involve taking patches that contain possible allergens and placing them on your back. He or she will then check in a few days to see if an allergic reaction occurred. What are the common treatments? Treatment for eczema is based on the type of eczema you have. Hydrocortisone steroid medicine can relieve itching quickly and help reduce inflammation. This medicine may be prescribed or obtained over-the-counter, depending on the strength of the medicine that is needed. Follow these instructions at home:  Take over-the-counter and prescription medicines only as told by your health care provider.  Use creams or ointments to moisturize your skin. Do not use lotions.  Learn what triggers or irritates your symptoms. Avoid these things.  Treat symptom flare-ups quickly.  Do not itch your skin. This can make your rash worse.  Keep all follow-up visits as told by your health care provider. This is important. Where to find  more information:  The American Academy of Dermatology: http://jones-macias.info/  The National Eczema Association: www.nationaleczema.org Contact a health care provider if:  You have serious itching, even with treatment.  You regularly scratch your skin until it bleeds.  Your rash looks different than usual.  Your skin is painful, swollen, or more red than usual.  You have a fever. Summary  There are eight general types of eczema. Each type has different triggers.  Eczema of any type causes itching that may range from mild to severe.  Treatment varies based on the type of eczema you have. Hydrocortisone steroid medicine can help with itching and inflammation.  Protecting your skin is the best  way to prevent eczema. Use moisturizers and lotions. Avoid triggers and irritants, and treat flare-ups quickly. This information is not intended to replace advice given to you by your health care provider. Make sure you discuss any questions you have with your health care provider. Document Released: 02/26/2017 Document Revised: 02/26/2017 Document Reviewed: 02/26/2017 Elsevier Interactive Patient Education  2018 [employer].

## 2018-12-21 ENCOUNTER — Encounter: Payer: Self-pay | Admitting: Internal Medicine

## 2018-12-30 ENCOUNTER — Ambulatory Visit: Payer: BC Managed Care – PPO | Admitting: Internal Medicine

## 2018-12-30 ENCOUNTER — Encounter: Payer: Self-pay | Admitting: Internal Medicine

## 2018-12-30 VITALS — BP 116/68 | HR 93 | Temp 98.2°F | Ht 61.0 in | Wt 168.0 lb

## 2018-12-30 DIAGNOSIS — Z0184 Encounter for antibody response examination: Secondary | ICD-10-CM

## 2018-12-30 DIAGNOSIS — Z1159 Encounter for screening for other viral diseases: Secondary | ICD-10-CM | POA: Diagnosis not present

## 2018-12-30 DIAGNOSIS — J321 Chronic frontal sinusitis: Secondary | ICD-10-CM

## 2018-12-30 MED ORDER — AMOXICILLIN-POT CLAVULANATE 875-125 MG PO TABS: 1.0000 | ORAL_TABLET | Freq: Two times a day (BID) | ORAL | 0 refills | Status: DC

## 2018-12-30 NOTE — Progress Notes (Signed)
Pre visit review using our clinic review tool, if applicable. No additional management support is needed unless otherwise documented below in the visit note. 

## 2018-12-30 NOTE — Progress Notes (Signed)
Chief Complaint  Patient presents with  . Headache   Sick visit  1. C/o frontal sinus pressure since 2/25 and stuffy nose with green snot tried steam, nasal saline, flonase, claritin. 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  HENT: Positive for sinus pain.        +PND  Eyes: Negative for blurred vision.  Respiratory: Negative for cough.   Cardiovascular: Negative for chest pain.  Gastrointestinal: Negative for abdominal pain.  Musculoskeletal: Negative for falls.  Skin: Negative for rash.  Neurological: Positive for headaches.  Psychiatric/Behavioral: Negative for depression.   Past Medical History:  Diagnosis Date  . Anemia   . Eczema   . Eczema   . Endometriosis   . History of ovarian cyst   . Melanoma ([place])    invasive right upper back   . Migraine    On continuous BC for prevention  . Prediabetes    Past Surgical History:  Procedure Laterality Date  . ABLATION ON ENDOMETRIOSIS  2012  . CESAREAN SECTION N/A 04/12/2017   Procedure: CESAREAN SECTION;  Surgeon: Gae Dry, MD;  Location: [facility] ORS;  Service: Obstetrics;  Laterality: N/A;  . LAPAROSCOPIC OVARIAN CYSTECTOMY Right 2012   Family History  Problem Relation Age of Onset  . Hypertension Mother   . Birth defects Mother        Congential heart condition   . Endometriosis Mother   . Diabetes Mother   . Other Mother        endometrosis  . Cancer Father        Prostate  . Diabetes Paternal Grandfather   . Stroke Paternal Grandfather    Social History   Socioeconomic History  . Marital status: Married    Spouse name: Mendi Constable - Fiance  . Number of children: Not on file  . Years of education: 79  . Highest education level: Not on file  Occupational History  . 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 Financial resource strain: Not on file  . Food insecurity:    Worry: Not on file    Inability: Not on file  . Transportation needs:    Medical: Not on file    Non-medical: Not on file  Tobacco Use  . Smoking status: Never Smoker  . Smokeless tobacco: Never Used  Substance and Sexual Activity  . Alcohol use: No    Alcohol/week: 0.0 standard drinks  . Drug use: No  . Sexual activity: Not Currently    Partners: Male    Birth control/protection: None  Lifestyle  . Physical activity:    Days per week: Not on file    Minutes per session: Not on file  . Stress: Not on file  Relationships  . Social connections:    Talks on phone: Not on file    Gets together: Not on file    Attends religious service: Not on file    Active member of club or organization: Not on file    Attends meetings of clubs or organizations: Not on file    Relationship status: Not on file  . Intimate partner violence:    Fear of current or ex partner: Not on file    Emotionally abused: Not on file    Physically  abused: Not on file    Forced sexual activity: Not on file  Other Topics Concern  . Not on file  Social History Narrative   Sola was born and reared in [place], [place]. She graduated from [employer] in 2008 and then went to [employer] and obtained her Bachelors in [employer] in 2012. Married with 1 son had in 2018.  She has a turtle named Crush. 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 She enjoys photography, hanging out with friends. She enjoys action movies. 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      Caffeine- Coffee 1 cup, 1 small can of soda   Current Meds  Medication Sig  . clobetasol ointment (TEMOVATE) 0.05 % APPLY THIN FILM TWICE DAILY TO NON FACIAL SITES UNTIL CLEAR  . hydrocortisone 2.5 % cream Apply topically 2 (two) times daily. Right upper back  . Levonorgestrel-Ethinyl  Estradiol (AMETHIA,CAMRESE) 0.15-0.03 &0.01 MG tablet Take 1 tablet by mouth daily.   Current Facility-Administered Medications for the 12/30/18 encounter (Office Visit) with McLean-Scocuzza, Nino Glow, MD  Medication  . triamcinolone cream (KENALOG) 0.5 %   No Known Allergies No results found for this or any previous visit (from the past 2160 hour(s)). Objective  Body mass index is 31.74 kg/m. Wt Readings from Last 3 Encounters:  12/30/18 168 lb (76.2 kg)  07/27/18 166 lb 12.8 oz (75.7 kg)  03/05/18 166 lb 12.8 oz (75.7 kg)   Temp Readings from Last 3 Encounters:  12/30/18 98.2 F (36.8 C) (Oral)  07/27/18 98.5 F (36.9 C) (Oral)  03/05/18 98.4 F (36.9 C) (Oral)   BP Readings from Last 3 Encounters:  12/30/18 116/68  07/27/18 122/68  03/05/18 120/70   Pulse Readings from Last 3 Encounters:  12/30/18 93  07/27/18 89  03/05/18 100    Physical Exam Vitals signs and nursing note reviewed.  Constitutional:      Appearance: Normal appearance. She is well-developed and well-groomed.  HENT:     Head: Normocephalic and atraumatic.     Nose:     Right Sinus: Frontal sinus tenderness present.     Left Sinus: Frontal sinus tenderness present.     Mouth/Throat:     Mouth: Mucous membranes are moist.     Pharynx: Oropharynx is clear.  Eyes:     Conjunctiva/sclera: Conjunctivae normal.     Pupils: Pupils are equal, round, and reactive to light.  Cardiovascular:     Rate and Rhythm: Normal rate and regular rhythm.     Heart sounds: Normal heart sounds. No murmur.  Pulmonary:     Effort: Pulmonary effort is normal.     Breath sounds: Normal breath sounds.  Skin:    General: Skin is warm and dry.  Neurological:     General: No focal deficit present.     Mental Status: She is alert and oriented to person, place, and time. Mental status is at baseline.     Gait: Gait normal.  Psychiatric:        Attention and Perception: Attention and perception normal.        Mood and  Affect: Mood and affect normal.        Speech: Speech normal.        Behavior: Behavior normal. Behavior is cooperative.        Thought Content: Thought content normal.        Cognition and Memory: Cognition and memory  normal.        Judgment: Judgment normal.     Assessment   1. Sinusitis  2. HM Plan   1.  NS, Flonase, Augmentin 2.  Flu shot had 12/21/18 Tdap UTD Hep B immune   Reviewed labs was on cycle with last urine  [facility] negative pap no GC/C no HPV testing done -pap due 08/29/2019   Dermatology referred has eczema, RUB, hidradenitis -saw 05/07/18 right mid back invasive MM 0.7 MM DEPTH Dr. Kellie Moor excised 05/19/18 neg margins -appt in 2 weeks 07/2018 dermatology q3 months seen 11/2018 taking bleach baths   Provider: Dr. Olivia Mackie McLean-Scocuzza-Internal Medicine

## 2018-12-30 NOTE — Patient Instructions (Addendum)
[facility]   Sinusitis, Adult Sinusitis is inflammation of your sinuses. Sinuses are hollow spaces in the bones around your face. Your sinuses are located:  Around your eyes.  In the middle of your forehead.  Behind your nose.  In your cheekbones. Mucus normally drains out of your sinuses. When your nasal tissues become inflamed or swollen, mucus can become trapped or blocked. This allows bacteria, viruses, and fungi to grow, which leads to infection. Most infections of the sinuses are caused by a virus. Sinusitis can develop quickly. It can last for up to 4 weeks (acute) or for more than 12 weeks (chronic). Sinusitis often develops after a cold. What are the causes? This condition is caused by anything that creates swelling in the sinuses or stops mucus from draining. This includes:  Allergies.  Asthma.  Infection from bacteria or viruses.  Deformities or blockages in your nose or sinuses.  Abnormal growths in the nose (nasal polyps).  Pollutants, such as chemicals or irritants in the air.  Infection from fungi (rare). What increases the risk? You are more likely to develop this condition if you:  Have a weak body defense system (immune system).  Do a lot of swimming or diving.  Overuse nasal sprays.  Smoke. What are the signs or symptoms? The main symptoms of this condition are pain and a feeling of pressure around the affected sinuses. Other symptoms include:  Stuffy nose or congestion.  Thick drainage from your nose.  Swelling and warmth over the affected sinuses.  Headache.  Upper toothache.  A cough that may get worse at night.  Extra mucus that collects in the throat or the back of the nose (postnasal drip).  Decreased sense of smell and taste.  Fatigue.  A fever.  Sore throat.  Bad breath. How is this diagnosed? This condition is diagnosed based on:  Your symptoms.  Your medical history.  A physical exam.  Tests to  find out if your condition is acute or chronic. This may include: ? Checking your nose for nasal polyps. ? Viewing your sinuses using a device that has a light (endoscope). ? Testing for allergies or bacteria. ? Imaging tests, such as an MRI or CT scan. In rare cases, a bone biopsy may be done to rule out more serious types of fungal sinus disease. How is this treated? Treatment for sinusitis depends on the cause and whether your condition is chronic or acute.  If caused by a virus, your symptoms should go away on their own within 10 days. You may be given medicines to relieve symptoms. They include: ? Medicines that shrink swollen nasal passages (topical intranasal decongestants). ? Medicines that treat allergies (antihistamines). ? A spray that eases inflammation of the nostrils (topical intranasal corticosteroids). ? Rinses that help get rid of thick mucus in your nose (nasal saline washes).  If caused by bacteria, your health care provider may recommend waiting to see if your symptoms improve. Most bacterial infections will get better without antibiotic medicine. You may be given antibiotics if you have: ? A severe infection. ? A weak immune system.  If caused by narrow nasal passages or nasal polyps, you may need to have surgery. Follow these instructions at home: Medicines  Take, use, or apply over-the-counter and prescription medicines only as told by your health care provider. These may include nasal sprays.  If you were prescribed an antibiotic medicine, take it as told by your health care provider. Do not stop taking  the antibiotic even if you start to feel better. Hydrate and humidify   Drink enough fluid to keep your urine pale yellow. Staying hydrated will help to thin your mucus.  Use a cool mist humidifier to keep the humidity level in your home above 50%.  Inhale steam for 10-15 minutes, 3-4 times a day, or as told by your health care provider. You can do this in the  bathroom while a hot shower is running.  Limit your exposure to cool or dry air. Rest  Rest as much as possible.  Sleep with your head raised (elevated).  Make sure you get enough sleep each night. General instructions   Apply a warm, moist washcloth to your face 3-4 times a day or as told by your health care provider. This will help with discomfort.  Wash your hands often with soap and water to reduce your exposure to germs. If soap and water are not available, use hand sanitizer.  Do not smoke. Avoid being around people who are smoking (secondhand smoke).  Keep all follow-up visits as told by your health care provider. This is important. Contact a health care provider if:  You have a fever.  Your symptoms get worse.  Your symptoms do not improve within 10 days. Get help right away if:  You have a severe headache.  You have persistent vomiting.  You have severe pain or swelling around your face or eyes.  You have vision problems.  You develop confusion.  Your neck is stiff.  You have trouble breathing. Summary  Sinusitis is soreness and inflammation of your sinuses. Sinuses are hollow spaces in the bones around your face.  This condition is caused by nasal tissues that become inflamed or swollen. The swelling traps or blocks the flow of mucus. This allows bacteria, viruses, and fungi to grow, which leads to infection.  If you were prescribed an antibiotic medicine, take it as told by your health care provider. Do not stop taking the antibiotic even if you start to feel better.  Keep all follow-up visits as told by your health care provider. This is important. This information is not intended to replace advice given to you by your health care provider. Make sure you discuss any questions you have with your health care provider. Document Released: 10/13/2005 Document Revised: 03/15/2018 Document Reviewed: 03/15/2018 Elsevier Interactive Patient Education  2019  [employer].

## 2019-03-04 ENCOUNTER — Other Ambulatory Visit: Payer: BC Managed Care – PPO

## 2019-04-11 ENCOUNTER — Other Ambulatory Visit: Payer: Self-pay

## 2019-04-11 ENCOUNTER — Other Ambulatory Visit (INDEPENDENT_AMBULATORY_CARE_PROVIDER_SITE_OTHER): Payer: BC Managed Care – PPO

## 2019-04-11 DIAGNOSIS — Z13220 Encounter for screening for lipoid disorders: Secondary | ICD-10-CM

## 2019-04-11 DIAGNOSIS — Z1159 Encounter for screening for other viral diseases: Secondary | ICD-10-CM | POA: Diagnosis not present

## 2019-04-11 DIAGNOSIS — R7303 Prediabetes: Secondary | ICD-10-CM

## 2019-04-11 DIAGNOSIS — E559 Vitamin D deficiency, unspecified: Secondary | ICD-10-CM | POA: Diagnosis not present

## 2019-04-11 DIAGNOSIS — Z1329 Encounter for screening for other suspected endocrine disorder: Secondary | ICD-10-CM

## 2019-04-11 DIAGNOSIS — Z0000 Encounter for general adult medical examination without abnormal findings: Secondary | ICD-10-CM

## 2019-04-11 DIAGNOSIS — Z0184 Encounter for antibody response examination: Secondary | ICD-10-CM

## 2019-04-11 DIAGNOSIS — Z1389 Encounter for screening for other disorder: Secondary | ICD-10-CM

## 2019-04-11 LAB — COMPREHENSIVE METABOLIC PANEL
ALT: 19 U/L (ref 0–35)
AST: 16 U/L (ref 0–37)
Albumin: 4 g/dL (ref 3.5–5.2)
Alkaline Phosphatase: 93 U/L (ref 39–117)
BUN: 16 mg/dL (ref 6–23)
CO2: 25 mEq/L (ref 19–32)
Calcium: 9.4 mg/dL (ref 8.4–10.5)
Chloride: 104 mEq/L (ref 96–112)
Creatinine, Ser: 0.93 mg/dL (ref 0.40–1.20)
GFR: 70.77 mL/min (ref >60.00)
Glucose, Bld: 84 mg/dL (ref 70–99)
Potassium: 4.2 mEq/L (ref 3.5–5.1)
Sodium: 137 mEq/L (ref 135–145)
Total Bilirubin: 0.4 mg/dL (ref 0.2–1.2)
Total Protein: 7.2 g/dL (ref 6.0–8.3)

## 2019-04-11 LAB — LIPID PANEL
Cholesterol: 189 mg/dL (ref 0–200)
HDL: 37.2 mg/dL — ABNORMAL LOW (ref >39.00)
LDL Cholesterol: 120 mg/dL — ABNORMAL HIGH (ref 0–99)
NonHDL: 151.38
Total CHOL/HDL Ratio: 5
Triglycerides: 155 mg/dL — ABNORMAL HIGH (ref 0.0–149.0)
VLDL: 31 mg/dL (ref 0.0–40.0)

## 2019-04-11 LAB — CBC WITH DIFFERENTIAL/PLATELET
Basophils Absolute: 0 10*3/uL (ref 0.0–0.1)
Basophils Relative: 0.5 % (ref 0.0–3.0)
Eosinophils Absolute: 0.1 10*3/uL (ref 0.0–0.7)
Eosinophils Relative: 2 % (ref 0.0–5.0)
HCT: 34.3 % — ABNORMAL LOW (ref 36.0–46.0)
Hemoglobin: 11.7 g/dL — ABNORMAL LOW (ref 12.0–15.0)
Lymphocytes Relative: 51.3 % — ABNORMAL HIGH (ref 12.0–46.0)
Lymphs Abs: 3 10*3/uL (ref 0.7–4.0)
MCHC: 34.2 g/dL (ref 30.0–36.0)
MCV: 91.4 fl (ref 78.0–100.0)
Monocytes Absolute: 0.5 10*3/uL (ref 0.1–1.0)
Monocytes Relative: 8.6 % (ref 3.0–12.0)
Neutro Abs: 2.2 10*3/uL (ref 1.4–7.7)
Neutrophils Relative %: 37.6 % — ABNORMAL LOW (ref 43.0–77.0)
Platelets: 286 10*3/uL (ref 150.0–400.0)
RBC: 3.75 Mil/uL — ABNORMAL LOW (ref 3.87–5.11)
RDW: 13.6 % (ref 11.5–15.5)
WBC: 5.8 10*3/uL (ref 4.0–10.5)

## 2019-04-11 LAB — HEMOGLOBIN A1C: Hgb A1c MFr Bld: 5.9 % (ref 4.6–6.5)

## 2019-04-11 LAB — T4, FREE: Free T4: 0.8 ng/dL (ref 0.60–1.60)

## 2019-04-11 LAB — TSH: TSH: 1.94 u[IU]/mL (ref 0.35–4.50)

## 2019-04-11 LAB — VITAMIN D 25 HYDROXY (VIT D DEFICIENCY, FRACTURES): VITD: 36.46 ng/mL (ref 30.00–100.00)

## 2019-04-11 NOTE — Addendum Note (Signed)
Addended byElpidio Galea T on: 04/11/2019 01:22 PM   Modules accepted: Orders

## 2019-04-12 LAB — URINALYSIS, ROUTINE W REFLEX MICROSCOPIC
Bilirubin Urine: NEGATIVE
Glucose, UA: NEGATIVE
Hgb urine dipstick: NEGATIVE
Ketones, ur: NEGATIVE
Leukocytes,Ua: NEGATIVE
Nitrite: NEGATIVE
Protein, ur: NEGATIVE
Specific Gravity, Urine: 1.019 (ref 1.001–1.03)
pH: 5.5 (ref 5.0–8.0)

## 2019-04-13 LAB — MEASLES/MUMPS/RUBELLA IMMUNITY
Mumps IgG: >300 AU/mL
Rubella: 4.95 index
Rubeola IgG: >300 AU/mL

## 2019-04-13 LAB — URINALYSIS, ROUTINE W REFLEX MICROSCOPIC

## 2019-08-06 IMAGING — US US BREAST*R* LIMITED INC AXILLA
1 series · 5 of 5 positions shown · non-contrast
Comparison: Previous exam(s).

CLINICAL DATA: 27-year-old patient presents for evaluation 1
o'clock position of the right breast.

EXAM:
ULTRASOUND OF THE RIGHT BREAST

[Series 1: us breast*right* limited inc axilla · 0.06mm/px · 5 of 5 slices shown]
[im 1/5]
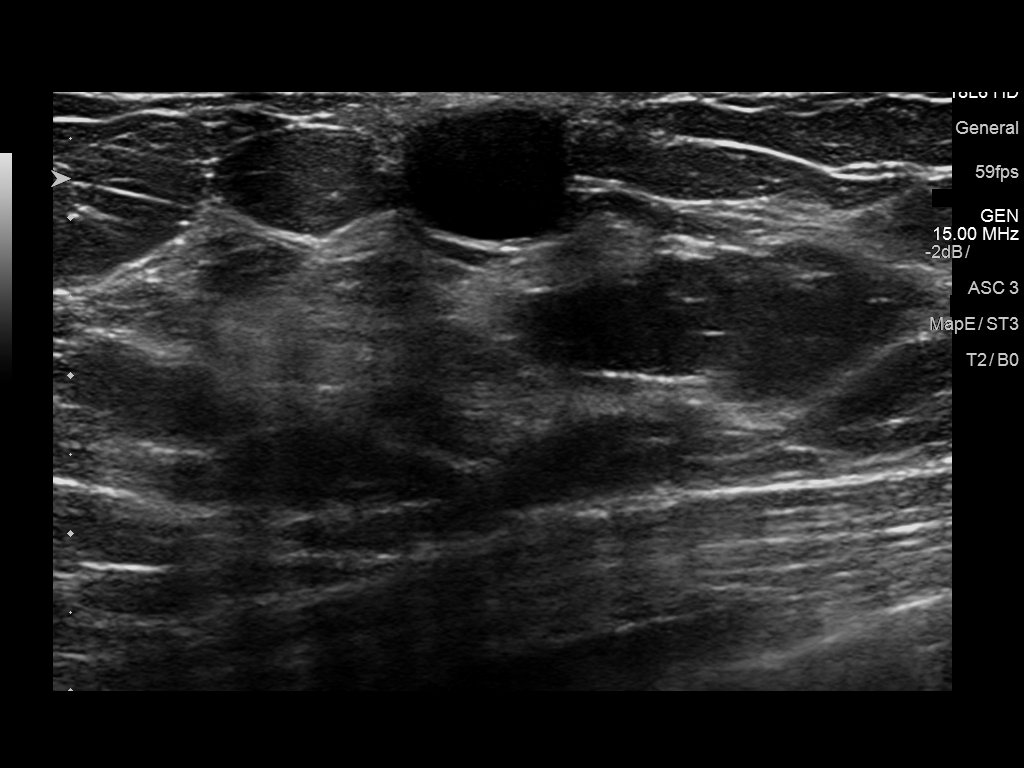
[im 2/5]
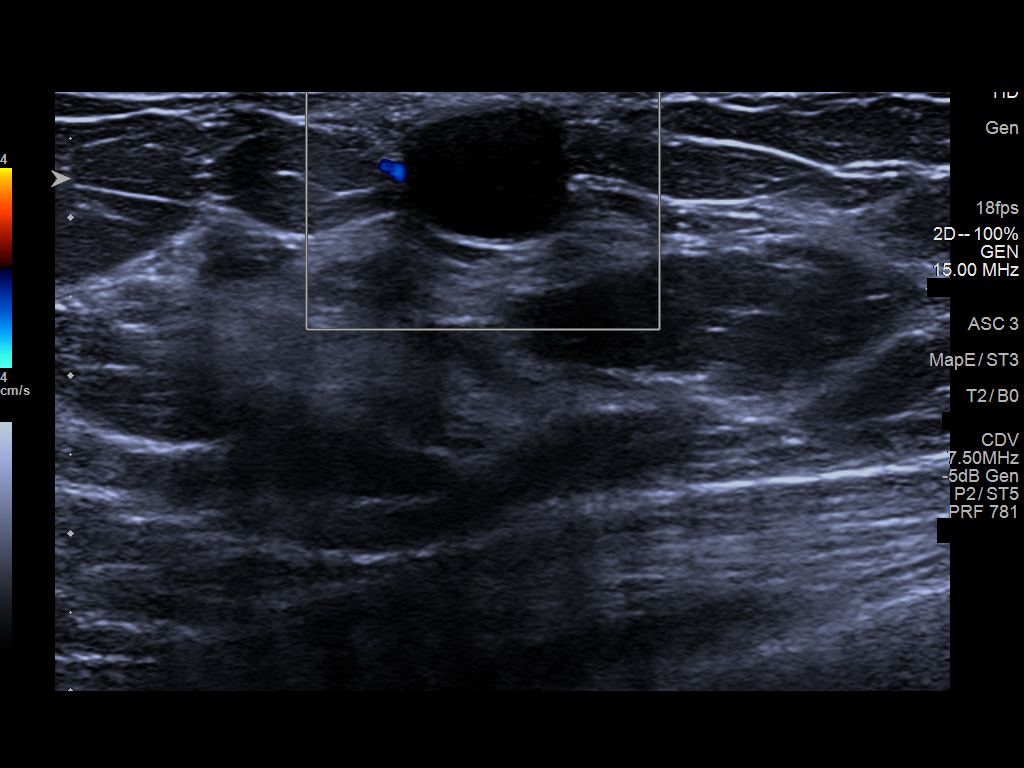
[im 3/5]
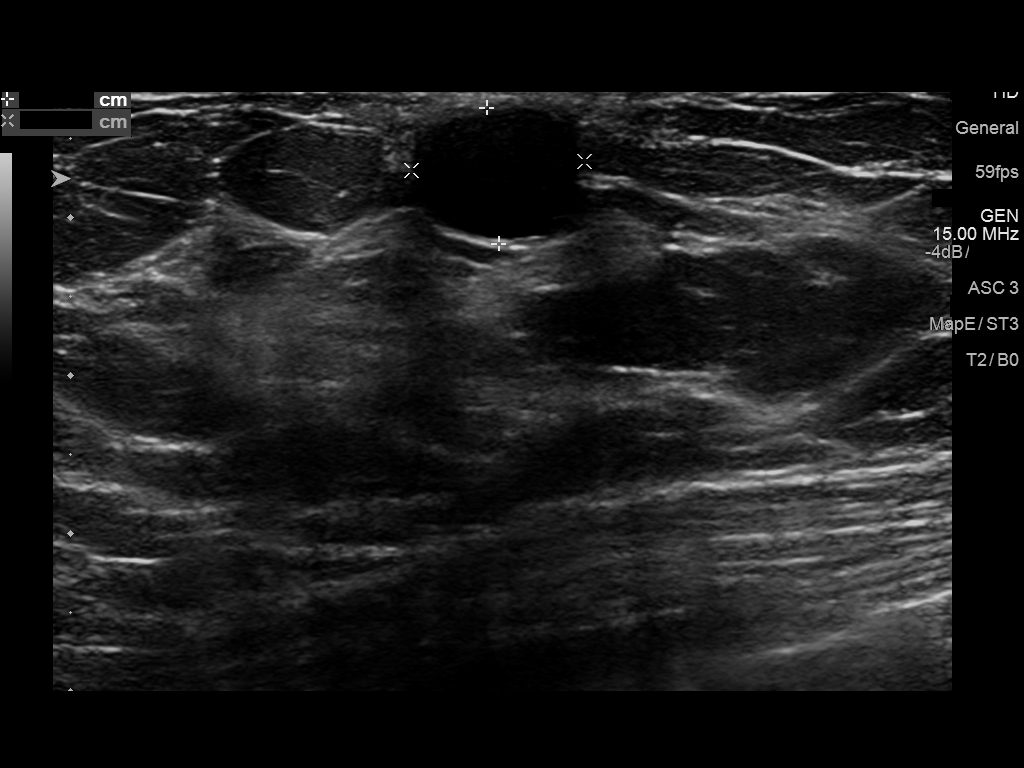
[im 4/5]
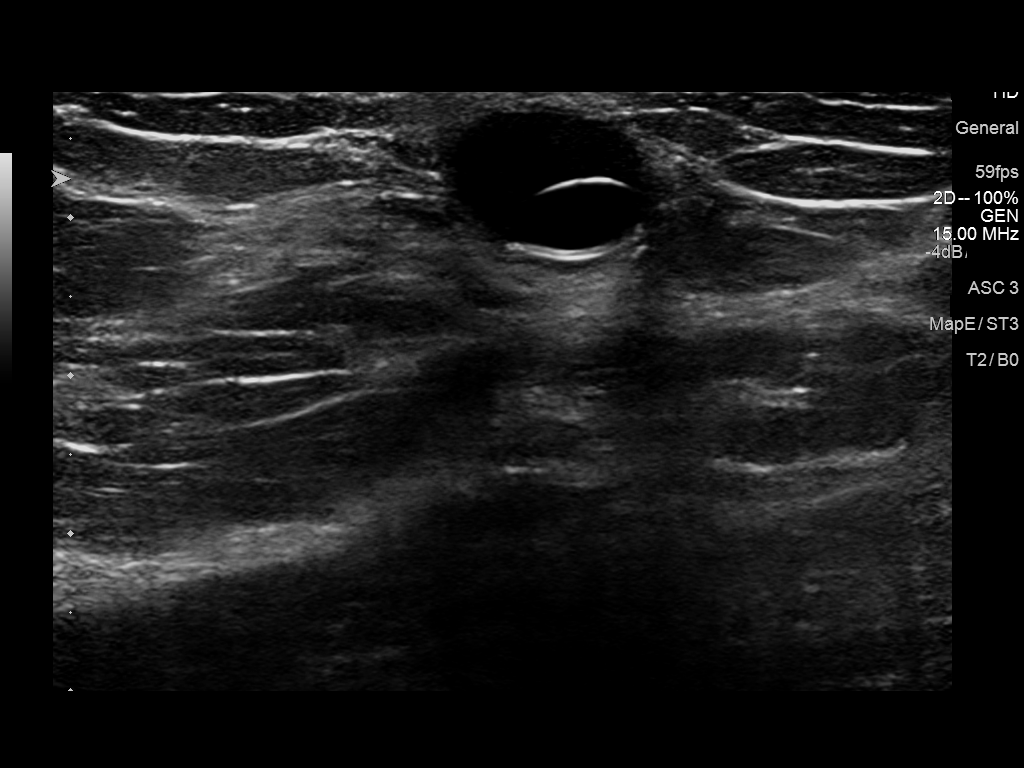
[im 5/5]
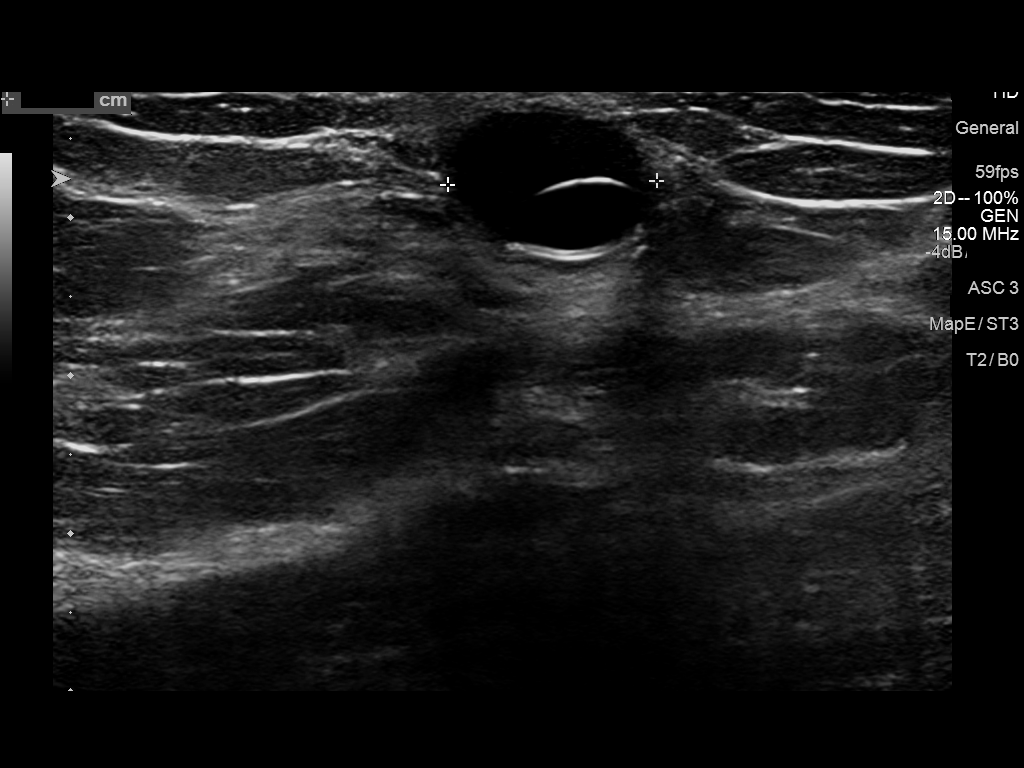

[5 of 5 positions shown; findings below may reference images not displayed]

FINDINGS: On physical exam, there is a superficial. Overlying skin appears
normal

Targeted ultrasound is performed, showing a circumscribed
predominantly anechoic oval mass with a well-defined posterior wall
measuring 0.9 x 1.1 x 1.3 cm, consistent with a cyst. There is a
single septation within the cyst. There is no associated vascular
flow.
IMPRESSION: Benign palpable cyst 1 o'clock position right breast. No evidence of
malignancy.

RECOMMENDATION:
Screening mammogram at age 38 unless there are persistent or
intervening clinical concerns. (Code:[id number])

I have discussed the findings and recommendations with the patient.
Results were also provided in writing at the conclusion of the
visit. If applicable, a reminder letter will be sent to the patient
regarding the next appointment.

BI-RADS CATEGORY  2: Benign.

## 2019-09-09 ENCOUNTER — Encounter: Payer: BC Managed Care – PPO | Admitting: Internal Medicine

## 2019-11-01 ENCOUNTER — Encounter: Payer: Self-pay | Admitting: Obstetrics & Gynecology

## 2019-11-01 ENCOUNTER — Other Ambulatory Visit (HOSPITAL_COMMUNITY)
Admission: RE | Admit: 2019-11-01 | Discharge: 2019-11-01 | Disposition: A | Discharge status: Home / Self Care | Payer: BC Managed Care – PPO | Source: Ambulatory Visit | Attending: Obstetrics & Gynecology | Admitting: Obstetrics & Gynecology

## 2019-11-01 ENCOUNTER — Ambulatory Visit (INDEPENDENT_AMBULATORY_CARE_PROVIDER_SITE_OTHER): Payer: BC Managed Care – PPO | Admitting: Obstetrics & Gynecology

## 2019-11-01 ENCOUNTER — Other Ambulatory Visit: Payer: Self-pay

## 2019-11-01 VITALS — BP 100/60 | Wt 174.0 lb

## 2019-11-01 DIAGNOSIS — Z23 Encounter for immunization: Secondary | ICD-10-CM

## 2019-11-01 DIAGNOSIS — Z3490 Encounter for supervision of normal pregnancy, unspecified, unspecified trimester: Secondary | ICD-10-CM

## 2019-11-01 DIAGNOSIS — Z124 Encounter for screening for malignant neoplasm of cervix: Secondary | ICD-10-CM | POA: Diagnosis present

## 2019-11-01 DIAGNOSIS — Z3201 Encounter for pregnancy test, result positive: Secondary | ICD-10-CM | POA: Diagnosis not present

## 2019-11-01 DIAGNOSIS — O34219 Maternal care for unspecified type scar from previous cesarean delivery: Secondary | ICD-10-CM | POA: Insufficient documentation

## 2019-11-01 DIAGNOSIS — N926 Irregular menstruation, unspecified: Secondary | ICD-10-CM

## 2019-11-01 DIAGNOSIS — O0990 Supervision of high risk pregnancy, unspecified, unspecified trimester: Secondary | ICD-10-CM | POA: Insufficient documentation

## 2019-11-01 DIAGNOSIS — Z3A08 8 weeks gestation of pregnancy: Secondary | ICD-10-CM

## 2019-11-01 DIAGNOSIS — Z98891 History of uterine scar from previous surgery: Secondary | ICD-10-CM | POA: Insufficient documentation

## 2019-11-01 LAB — POCT URINALYSIS DIPSTICK OB
Glucose, UA: NEGATIVE
POC,PROTEIN,UA: NEGATIVE

## 2019-11-01 LAB — POCT URINE PREGNANCY: Preg Test, Ur: POSITIVE — AB

## 2019-11-01 NOTE — Addendum Note (Signed)
Addended by: Quintella Baton D on: 11/01/2019 04:25 PM   Modules accepted: Orders

## 2019-11-01 NOTE — Patient Instructions (Signed)
First Trimester of Pregnancy The first trimester of pregnancy is from week 1 until the end of week 13 (months 1 through 3). A week after a sperm fertilizes an egg, the egg will implant on the wall of the uterus. This embryo will begin to develop into a baby. Genes from you and your partner will form the baby. The female genes will determine whether the baby will be a boy or a girl. At 6-8 weeks, the eyes and face will be formed, and the heartbeat can be seen on ultrasound. At the end of 12 weeks, all the baby's organs will be formed. Now that you are pregnant, you will want to do everything you can to have a healthy baby. Two of the most important things are to get good prenatal care and to follow your health care provider's instructions. Prenatal care is all the medical care you receive before the baby's birth. This care will help prevent, find, and treat any problems during the pregnancy and childbirth. Body changes during your first trimester Your body goes through many changes during pregnancy. The changes vary from woman to woman.  You may gain or lose a couple of pounds at first.  You may feel sick to your stomach (nauseous) and you may throw up (vomit). If the vomiting is uncontrollable, call your health care provider.  You may tire easily.  You may develop headaches that can be relieved by medicines. All medicines should be approved by your health care provider.  You may urinate more often. Painful urination may mean you have a bladder infection.  You may develop heartburn as a result of your pregnancy.  You may develop constipation because certain hormones are causing the muscles that push stool through your intestines to slow down.  You may develop hemorrhoids or swollen veins (varicose veins).  Your breasts may begin to grow larger and become tender. Your nipples may stick out more, and the tissue that surrounds them (areola) may become darker.  Your gums may bleed and may be  sensitive to brushing and flossing.  Dark spots or blotches (chloasma, mask of pregnancy) may develop on your face. This will likely fade after the baby is born.  Your menstrual periods will stop.  You may have a loss of appetite.  You may develop cravings for certain kinds of food.  You may have changes in your emotions from day to day, such as being excited to be pregnant or being concerned that something may go wrong with the pregnancy and baby.  You may have more vivid and strange dreams.  You may have changes in your hair. These can include thickening of your hair, rapid growth, and changes in texture. Some women also have hair loss during or after pregnancy, or hair that feels dry or thin. Your hair will most likely return to normal after your baby is born. What to expect at prenatal visits During a routine prenatal visit:  You will be weighed to make sure you and the baby are growing normally.  Your blood pressure will be taken.  Your abdomen will be measured to track your baby's growth.  The fetal heartbeat will be listened to between weeks 10 and 14 of your pregnancy.  Test results from any previous visits will be discussed. Your health care provider may ask you:  How you are feeling.  If you are feeling the baby move.  If you have had any abnormal symptoms, such as leaking fluid, bleeding, severe headaches, or abdominal   cramping.  If you are using any tobacco products, including cigarettes, chewing tobacco, and electronic cigarettes.  If you have any questions. Other tests that may be performed during your first trimester include:  Blood tests to find your blood type and to check for the presence of any previous infections. The tests will also be used to check for low iron levels (anemia) and protein on red blood cells (Rh antibodies). Depending on your risk factors, or if you previously had diabetes during pregnancy, you may have tests to check for high blood sugar  that affects pregnant women (gestational diabetes).  Urine tests to check for infections, diabetes, or protein in the urine.  An ultrasound to confirm the proper growth and development of the baby.  Fetal screens for spinal cord problems (spina bifida) and Down syndrome.  HIV (human immunodeficiency virus) testing. Routine prenatal testing includes screening for HIV, unless you choose not to have this test.  You may need other tests to make sure you and the baby are doing well. Follow these instructions at home: Medicines  Follow your health care provider's instructions regarding medicine use. Specific medicines may be either safe or unsafe to take during pregnancy.  Take a prenatal vitamin that contains at least 600 micrograms (mcg) of folic acid.  If you develop constipation, try taking a stool softener if your health care provider approves. Eating and drinking   Eat a balanced diet that includes fresh fruits and vegetables, whole grains, good sources of protein such as meat, eggs, or tofu, and low-fat dairy. Your health care provider will help you determine the amount of weight gain that is right for you.  Avoid raw meat and uncooked cheese. These carry germs that can cause birth defects in the baby.  Eating four or five small meals rather than three large meals a day may help relieve nausea and vomiting. If you start to feel nauseous, eating a few soda crackers can be helpful. Drinking liquids between meals, instead of during meals, also seems to help ease nausea and vomiting.  Limit foods that are high in fat and processed sugars, such as fried and sweet foods.  To prevent constipation: ? Eat foods that are high in fiber, such as fresh fruits and vegetables, whole grains, and beans. ? Drink enough fluid to keep your urine clear or pale yellow. Activity  Exercise only as directed by your health care provider. Most women can continue their usual exercise routine during  pregnancy. Try to exercise for 30 minutes at least 5 days a week. Exercising will help you: ? Control your weight. ? Stay in shape. ? Be prepared for labor and delivery.  Experiencing pain or cramping in the lower abdomen or lower back is a good sign that you should stop exercising. Check with your health care provider before continuing with normal exercises.  Try to avoid standing for long periods of time. Move your legs often if you must stand in one place for a long time.  Avoid heavy lifting.  Wear low-heeled shoes and practice good posture.  You may continue to have sex unless your health care provider tells you not to. Relieving pain and discomfort  Wear a good support bra to relieve breast tenderness.  Take warm sitz baths to soothe any pain or discomfort caused by hemorrhoids. Use hemorrhoid cream if your health care provider approves.  Rest with your legs elevated if you have leg cramps or low back pain.  If you develop varicose veins in   your legs, wear support hose. Elevate your feet for 15 minutes, 3-4 times a day. Limit salt in your diet. Prenatal care  Schedule your prenatal visits by the twelfth week of pregnancy. They are usually scheduled monthly at first, then more often in the last 2 months before delivery.  Write down your questions. Take them to your prenatal visits.  Keep all your prenatal visits as told by your health care provider. This is important. Safety  Wear your seat belt at all times when driving.  Make a list of emergency phone numbers, including numbers for family, friends, the hospital, and police and fire departments. General instructions  Ask your health care provider for a referral to a local prenatal education class. Begin classes no later than the beginning of month 6 of your pregnancy.  Ask for help if you have counseling or nutritional needs during pregnancy. Your health care provider can offer advice or refer you to specialists for help  with various needs.  Do not use hot tubs, steam rooms, or saunas.  Do not douche or use tampons or scented sanitary pads.  Do not cross your legs for long periods of time.  Avoid cat litter boxes and soil used by cats. These carry germs that can cause birth defects in the baby and possibly loss of the fetus by miscarriage or stillbirth.  Avoid all smoking, herbs, alcohol, and medicines not prescribed by your health care provider. Chemicals in these products affect the formation and growth of the baby.  Do not use any products that contain nicotine or tobacco, such as cigarettes and e-cigarettes. If you need help quitting, ask your health care provider. You may receive counseling support and other resources to help you quit.  Schedule a dentist appointment. At home, brush your teeth with a soft toothbrush and be gentle when you floss. Contact a health care provider if:  You have dizziness.  You have mild pelvic cramps, pelvic pressure, or nagging pain in the abdominal area.  You have persistent nausea, vomiting, or diarrhea.  You have a bad smelling vaginal discharge.  You have pain when you urinate.  You notice increased swelling in your face, hands, legs, or ankles.  You are exposed to fifth disease or chickenpox.  You are exposed to [place] measles (rubella) and have never had it. Get help right away if:  You have a fever.  You are leaking fluid from your vagina.  You have spotting or bleeding from your vagina.  You have severe abdominal cramping or pain.  You have rapid weight gain or loss.  You vomit blood or material that looks like coffee grounds.  You develop a severe headache.  You have shortness of breath.  You have any kind of trauma, such as from a fall or a car accident. Summary  The first trimester of pregnancy is from week 1 until the end of week 13 (months 1 through 3).  Your body goes through many changes during pregnancy. The changes vary from  woman to woman.  You will have routine prenatal visits. During those visits, your health care provider will examine you, discuss any test results you may have, and talk with you about how you are feeling. This information is not intended to replace advice given to you by your health care provider. Make sure you discuss any questions you have with your health care provider. Document Revised: 09/25/2017 Document Reviewed: 09/24/2016 Elsevier Patient Education  [place].   Vaginal Birth After Cesarean  Delivery  Vaginal birth after cesarean delivery (VBAC) is giving birth vaginally after previously delivering a baby through a cesarean section (C-section). A VBAC may be a safe option for you, depending on your health and other factors. It is important to discuss VBAC with your health care provider early in your pregnancy so you can understand the risks, benefits, and options. Having these discussions early will give you time to make your birth plan. Who are the best candidates for VBAC? The best candidates for VBAC are women who:  Have had one or two prior cesarean deliveries, and the incision made during the delivery was horizontal (low transverse).  Do not have a vertical (classical) scar on their uterus.  Have not had a tear in the wall of their uterus (uterine rupture).  Plan to have more pregnancies. A VBAC is also more likely to be successful:  In women who have previously given birth vaginally.  When labor starts by itself (spontaneously) before the due date. What are the benefits of VBAC? The benefits of delivering your baby vaginally instead of by a cesarean delivery include:  A shorter hospital stay.  A faster recovery time.  Less pain.  Avoiding risks associated with major surgery, such as infection and blood clots.  Less blood loss and less need for donated blood (transfusions). What are the risks of VBAC? The main risk of attempting a VBAC is that it may  fail, forcing your health care provider to deliver your baby by a C-section. Other risks are rare and include:  Tearing (rupture) of the scar from a past cesarean delivery.  Other risks associated with vaginal deliveries. If a repeat cesarean delivery is needed, the risks include:  Blood loss.  Infection.  Blood clot.  Damage to surrounding organs.  Removal of the uterus (hysterectomy), if it is damaged.  Placenta problems in future pregnancies. What else should I know about my options? Delivering a baby through a VBAC is similar to having a normal spontaneous vaginal delivery. Therefore, it is safe:  To try with twins.  For your health care provider to try to turn the baby from a breech position (external cephalic version) during labor.  With epidural analgesia for pain relief. Consider where you would like to deliver your baby. VBAC should be attempted in facilities where an emergency cesarean delivery can be performed. VBAC is not recommended for home births. Any changes in your health or your baby's health during your pregnancy may make it necessary to change your initial decision about VBAC. Your health care provider may recommend that you do not attempt a VBAC if:  Your baby's suspected weight is 8.8 lb (4 kg) or more.  You have preeclampsia. This is a condition that causes high blood pressure along with other symptoms, such as swelling and headaches.  You will have VBAC less than 19 months after your cesarean delivery.  You are past your due date.  You need to have labor started (induced) because your cervix is not ready for labor (unfavorable). Where to find more information  American Pregnancy Association: americanpregnancy.org  [employer] of Obstetricians and Gynecologists: acog.org Summary  Vaginal birth after cesarean delivery (VBAC) is giving birth vaginally after previously delivering a baby through a cesarean section (C-section). A VBAC may be a  safe option for you, depending on your health and other factors.  Discuss VBAC with your health care provider early in your pregnancy so you can understand the risks, benefits, options, and have plenty of  time to make your birth plan.  The main risk of attempting a VBAC is that it may fail, forcing your health care provider to deliver your baby by a C-section. Other risks are rare. This information is not intended to replace advice given to you by your health care provider. Make sure you discuss any questions you have with your health care provider. Document Revised: 02/08/2019 Document Reviewed: 01/20/2017 Elsevier Patient Education  [place].

## 2019-11-01 NOTE — Progress Notes (Signed)
11/01/2019   Chief Complaint: Missed period  Transfer of Care Patient: no  History of Present Illness: Heather Conner is a 29 y.o. G2P1001 [redacted]w[redacted]d based on Patient's last menstrual period was 09/02/2019. with an Estimated Date of Delivery: 06/08/20, with the above CC.   Her periods were: irregular periods from 25 to 31 days She was using no method when she conceived.  She has Positive signs or symptoms of nausea/vomiting of pregnancy. She has Negative signs or symptoms of miscarriage or preterm labor She identifies Negative Zika risk factors for her and her partner On any different medications around the time she conceived/early pregnancy: No  History of varicella: Yes   ROS: A 12-point review of systems was performed and negative, except as stated in the above HPI.  OBGYN History: As per HPI. OB History  Gravida Para Term Preterm AB Living  2 1 1     1   SAB TAB Ectopic Multiple Live Births          1    # Outcome Date GA Lbr Len/2nd Weight Sex Delivery Anes PTL Lv  2 Current           1 Term 04/12/17 [redacted]w[redacted]d  9 lb 11.6 oz (4.41 kg) M CS-LVertical EPI  LIV    Any issues with any prior pregnancies: CS for CPD at 41 weeks Any prior children are healthy, doing well, without any problems or issues: yes History of pap smears: Yes. Last pap smear 2018- Normal History of STIs: No   Past Medical History: Past Medical History:  Diagnosis Date  . Anemia   . Eczema   . Eczema   . Endometriosis   . History of ovarian cyst   . Melanoma ([facility])    invasive right upper back   . Migraine    On continuous BC for prevention  . Prediabetes     Past Surgical History: Past Surgical History:  Procedure Laterality Date  . ABLATION ON ENDOMETRIOSIS  2012  . CESAREAN SECTION N/A 04/12/2017   Procedure: CESAREAN SECTION;  Surgeon: Gae Dry, MD;  Location: [facility] ORS;  Service: Obstetrics;  Laterality: N/A;  . LAPAROSCOPIC OVARIAN CYSTECTOMY Right 2012    Family History:  Family History    Problem Relation Age of Onset  . Hypertension Mother   . Birth defects Mother        Congential heart condition   . Endometriosis Mother   . Diabetes Mother   . Other Mother        endometrosis  . Cancer Father        Prostate  . Diabetes Paternal Grandfather   . Stroke Paternal Grandfather    She denies any female cancers, bleeding or blood clotting disorders.  She denies any history of mental retardation, birth defects or genetic disorders in her or the FOB's history  Social History:  Social History   Socioeconomic History  . Marital status: Married    Spouse name: Yujin Antes - Fiance  . Number of children: Not on file  . Years of education: 31  . Highest education level: Not on file  Occupational History  . 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 Smoking status: Never Smoker  . Smokeless tobacco: Never Used  Substance and Sexual Activity  . Alcohol use: No    Alcohol/week: 0.0 standard drinks  . Drug use: No  . Sexual activity: Not Currently  Partners: Male    Birth control/protection: None  Other Topics Concern  . Not on file  Social History Narrative   Heather Conner was born and reared in [place], [place]. She graduated from [employer] in 2008 and then went to [employer] and obtained her Bachelors in [employer] in 2012. Married with 1 son had in 2018.  She has a turtle named Crush. 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 She enjoys photography, hanging out with friends. She enjoys action movies. 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      Caffeine- Coffee 1 cup, 1 small can of soda   Social Determinants of Health   Financial Resource Strain:   . Difficulty of Paying Living Expenses: Not on file  Food Insecurity:   . Worried About Charity fundraiser in the Last Year: Not on file  . Ran Out of  Food in the Last Year: Not on file  Transportation Needs:   . Lack of Transportation (Medical): Not on file  . Lack of Transportation (Non-Medical): Not on file  Physical Activity:   . Days of Exercise per Week: Not on file  . Minutes of Exercise per Session: Not on file  Stress:   . Feeling of Stress : Not on file  Social Connections:   . Frequency of Communication with Friends and Family: Not on file  . Frequency of Social Gatherings with Friends and Family: Not on file  . Attends Religious Services: Not on file  . Active Member of Clubs or Organizations: Not on file  . Attends Archivist Meetings: Not on file  . Marital Status: Not on file  Intimate Partner Violence:   . Fear of Current or Ex-Partner: Not on file  . Emotionally Abused: Not on file  . Physically Abused: Not on file  . Sexually Abused: Not on file   Any pets in the household: no  Allergy: No Known Allergies  Current Outpatient Medications:  Current Outpatient Medications:  .  amoxicillin-clavulanate (AUGMENTIN) 875-125 MG tablet, Take 1 tablet by mouth 2 (two) times daily. With food, Disp: 14 tablet, Rfl: 0 .  clobetasol ointment (TEMOVATE) 0.05 %, APPLY THIN FILM TWICE DAILY TO NON FACIAL SITES UNTIL CLEAR, Disp: , Rfl: 1 .  hydrocortisone 2.5 % cream, Apply topically 2 (two) times daily. Right upper back (Patient not taking: Reported on 11/01/2019), Disp: 30 g, Rfl: 0 .  Levonorgestrel-Ethinyl Estradiol (AMETHIA,CAMRESE) 0.15-0.03 &0.01 MG tablet, Take 1 tablet by mouth daily. (Patient not taking: Reported on 11/01/2019), Disp: 1 Package, Rfl: 4  Current Facility-Administered Medications:  .  triamcinolone cream (KENALOG) 0.5 %, , Topical, BID, Will Bonnet, MD   Physical Exam:   BP 100/60   Wt 174 lb (78.9 kg)   LMP 09/02/2019   BMI 32.88 kg/m  Body mass index is 32.88 kg/m. Constitutional: Well nourished, well developed female in no acute distress.  Neck:  Supple, normal appearance,  and no thyromegaly  Cardiovascular: S1, S2 normal, no murmur, rub or gallop, regular rate and rhythm Respiratory:  Clear to auscultation bilateral. Normal respiratory effort Abdomen: positive bowel sounds and no masses, hernias; diffusely non tender to palpation, non distended Breasts: breasts appear normal, no suspicious masses, no skin or nipple changes or axillary nodes. Neuro/Psych:  Normal mood and affect.  Skin:  Warm and dry.  Lymphatic:  No inguinal lymphadenopathy.   Pelvic exam: is not limited by body  habitus EGBUS: within normal limits, Vagina: within normal limits and with no blood in the vault, Cervix: normal appearing cervix without discharge or lesions, closed/long/high, Uterus:  enlarged: 6 weeks, and Adnexa:  no mass, fullness, tenderness  Assessment: Ms. Blizard is a 29 y.o. G2P1001 [redacted]w[redacted]d based on Patient's last menstrual period was 09/02/2019. with an Estimated Date of Delivery: 06/08/20,  for prenatal care.  Plan:  1) Avoid alcoholic beverages. 2) Patient encouraged not to smoke.  3) Discontinue the use of all non-medicinal drugs and chemicals.  4) Take prenatal vitamins daily.  5) Seatbelt use advised 6) Nutrition, food safety (fish, cheese advisories, and high nitrite foods) and exercise discussed. 7) Hospital and practice style delivering at [facility] discussed  8) Patient is asked about travel to areas at risk for the [place] virus, and counseled to avoid travel and exposure to mosquitoes or sexual partners who may have themselves been exposed to the virus. Testing is discussed, and will be ordered as appropriate.  9) Childbirth classes at [facility] advised 10) Genetic Screening, such as with 1st Trimester Screening, cell free fetal DNA, AFP testing, and Ultrasound, as well as with amniocentesis and CVS as appropriate, is discussed with patient. She plans to have not genetic testing this pregnancy. [place] SOON VBAC discussed  Problem list reviewed and updated.  Barnett Applebaum, MD,  [facility], [facility] 11/01/2019  4:17 PM

## 2019-11-02 LAB — RPR+RH+ABO+RUB AB+AB SCR+CB...
Antibody Screen: NEGATIVE
HIV Screen 4th Generation wRfx: NONREACTIVE
Hematocrit: 32.7 % — ABNORMAL LOW (ref 34.0–46.6)
Hemoglobin: 11.4 g/dL (ref 11.1–15.9)
Hepatitis B Surface Ag: NEGATIVE
MCH: 31.4 pg (ref 26.6–33.0)
MCHC: 34.9 g/dL (ref 31.5–35.7)
MCV: 90 fL (ref 79–97)
Platelets: 269 10*3/uL (ref 150–450)
RBC: 3.63 x10E6/uL — ABNORMAL LOW (ref 3.77–5.28)
RDW: 13.1 % (ref 11.7–15.4)
RPR Ser Ql: NONREACTIVE
Rh Factor: POSITIVE
Rubella Antibodies, IGG: 4.46 index (ref >0.99)
Varicella zoster IgG: 3014 index (ref >165)
WBC: 7.2 10*3/uL (ref 3.4–10.8)

## 2019-11-03 ENCOUNTER — Ambulatory Visit (INDEPENDENT_AMBULATORY_CARE_PROVIDER_SITE_OTHER): Payer: BC Managed Care – PPO | Admitting: Obstetrics and Gynecology

## 2019-11-03 ENCOUNTER — Other Ambulatory Visit: Payer: Self-pay

## 2019-11-03 ENCOUNTER — Ambulatory Visit (INDEPENDENT_AMBULATORY_CARE_PROVIDER_SITE_OTHER): Payer: BC Managed Care – PPO

## 2019-11-03 VITALS — BP 120/80 | Wt 174.0 lb

## 2019-11-03 DIAGNOSIS — Z3490 Encounter for supervision of normal pregnancy, unspecified, unspecified trimester: Secondary | ICD-10-CM

## 2019-11-03 DIAGNOSIS — N926 Irregular menstruation, unspecified: Secondary | ICD-10-CM

## 2019-11-03 DIAGNOSIS — Z3A08 8 weeks gestation of pregnancy: Secondary | ICD-10-CM

## 2019-11-03 DIAGNOSIS — O3481 Maternal care for other abnormalities of pelvic organs, first trimester: Secondary | ICD-10-CM

## 2019-11-03 DIAGNOSIS — N8311 Corpus luteum cyst of right ovary: Secondary | ICD-10-CM

## 2019-11-03 DIAGNOSIS — R7303 Prediabetes: Secondary | ICD-10-CM

## 2019-11-03 DIAGNOSIS — O34219 Maternal care for unspecified type scar from previous cesarean delivery: Secondary | ICD-10-CM

## 2019-11-03 DIAGNOSIS — Z3A09 9 weeks gestation of pregnancy: Secondary | ICD-10-CM

## 2019-11-03 DIAGNOSIS — O09299 Supervision of pregnancy with other poor reproductive or obstetric history, unspecified trimester: Secondary | ICD-10-CM

## 2019-11-03 DIAGNOSIS — Z98891 History of uterine scar from previous surgery: Secondary | ICD-10-CM

## 2019-11-03 DIAGNOSIS — O09291 Supervision of pregnancy with other poor reproductive or obstetric history, first trimester: Secondary | ICD-10-CM

## 2019-11-03 LAB — POCT URINALYSIS DIPSTICK OB
Glucose, UA: NEGATIVE
POC,PROTEIN,UA: NEGATIVE

## 2019-11-04 LAB — CYTOLOGY - PAP
Chlamydia: NEGATIVE
Comment: NEGATIVE
Comment: NORMAL
Diagnosis: NEGATIVE
Neisseria Gonorrhea: NEGATIVE

## 2019-11-04 NOTE — Progress Notes (Signed)
Routine Prenatal Care Visit  Subjective  Heather Conner is a 29 y.o. G2P1001 at [redacted]w[redacted]d being seen today for ongoing prenatal care.  She is currently monitored for the following issues for this low-risk pregnancy and has Annual physical exam; Anemia; Mastitis during puerperium; Endometriosis; Eczema; Ovarian cyst; Hematuria; Hidradenitis; Breast mass, right; Prediabetes; Benign cyst of right breast; Encounter for supervision of low-risk pregnancy, antepartum; and History of cesarean delivery on their problem list.  ----------------------------------------------------------------------------------- Patient reports no complaints.    . Vag. Bleeding: None.  Movement: Absent. Denies leaking of fluid.  ----------------------------------------------------------------------------------- The following portions of the patient's history were reviewed and updated as appropriate: allergies, current medications, past family history, past medical history, past social history, past surgical history and problem list. Problem list updated.   Objective  Blood pressure 120/80, weight 174 lb (78.9 kg), last menstrual period 09/02/2019, unknown if currently breastfeeding. Pregravid weight 174 lb (78.9 kg) Total Weight Gain 0 lb (0 kg) Urinalysis:      Fetal Status: Fetal Heart Rate (bpm): prese   Movement: Absent     General:  Alert, oriented and cooperative. Patient is in no acute distress.  Skin: Skin is warm and dry. No rash noted.   Cardiovascular: Normal heart rate noted  Respiratory: Normal respiratory effort, no problems with respiration noted  Abdomen: Soft, gravid, appropriate for gestational age. Pain/Pressure: Absent     Pelvic:  Cervical exam deferred        Extremities: Normal range of motion.     ental Status: Normal mood and affect. Normal behavior. Normal judgment and thought content.   US OB LESS THAN 14 WEEKS WITH OB TRANSVAGINAL  Result Date: 11/04/2019 Patient Name: Heather Conner DOB:  [birth year] MRN: [id number] ULTRASOUND REPORT Location: [facility] Date of Service: 11/03/2019 Indications:dating Findings: [name] intrauterine pregnancy is visualized with a CRL consistent with [redacted]w[redacted]d gestation, giving an (U/S) EDD of 06/11/2020. The (U/S) EDD is consistent with the clinically established EDD of 06/08/2020. FHR: 173 BPM CRL measurement: 18.9 mm Yolk sac is visualized and appears normal and early anatomy is normal. Amnion: visualized and appears normal Right Ovary is normal in appearance. Left Ovary is normal appearance. Corpus luteal cyst:  Right ovary Survey of the adnexa demonstrates no adnexal masses. There is no free peritoneal fluid in the cul de sac. Impression: 1. [redacted]w[redacted]d Viable Singleton Intrauterine pregnancy by U/S. 2. (U/S) EDD is consistent with Clinically established EDD of 06/08/2020. Recommendations: 1.Clinical correlation with the patient's History and Physical Exam. [name], RT There is a viable singleton gestation.  The fetal biometry correlates with established dating. Detailed evaluation of the fetal anatomy is precluded by early gestational age.  It must be noted that a normal ultrasound particular at this early gestational age is unable to rule out fetal aneuploidy, risk of first trimester miscarriage, or anatomic birth defects. [name], MD, [facility], [facility]     Assessment   29 y.o. G2P1001 at [redacted]w[redacted]d by  06/08/2020, by Last Menstrual Period presenting for routine prenatal visit  Plan   pregnancy2 Problems (from 09/02/19 to present)    Problem Noted Resolved   Encounter for supervision of low-risk pregnancy, antepartum 11/01/2019 by Gae Dry, MD No   Overview Addendum 11/04/2019 11:49 PM by [name], MD    [facility]inic [facility] Prenatal Labs  Dating LMP = 8 week [place] Blood type: O/Positive/-- (01/05 1625)   Genetic Screen 1 Screen:    AFP:  Quad:     NIPS: Antibody:Negative (01/05 1625)  Anatomic [place]   Rubella: 4.46 (01/05 1625) Varicella: Immune  GTT Declines RPR: Non Reactive (01/05 1625)   Rhogam N/A HBsAg: Negative (01/05 1625)   TDaP vaccine                       Flu Shot: HIV: Non Reactive (01/05 1625)   Baby Food                                GBS:   Contraception  Pap: 11/01/2019 NIL   CBB     CS/VBAC Considering TOLAC if EFW lower   Support Person Husband Alex           History of cesarean delivery 11/01/2019 by Gae Dry, MD No   Overview Signed 11/04/2019 11:48 PM by [name], MD    9lbs 11oz FTP          Gestational age appropriate obstetric precautions including but not limited to vaginal bleeding, contractions, leaking of fluid and fetal movement were reviewed in detail with the patient.    - discussed early 1-hr - discussed genetic testing declines at present  Return in about 4 weeks (around 12/01/2019) for ROB and early 1-hr.  [name], MD, [facility], [place]

## 2019-11-05 LAB — URINE CULTURE

## 2019-11-07 ENCOUNTER — Other Ambulatory Visit: Payer: Self-pay | Admitting: Obstetrics & Gynecology

## 2019-11-07 DIAGNOSIS — Z3490 Encounter for supervision of normal pregnancy, unspecified, unspecified trimester: Secondary | ICD-10-CM

## 2019-11-07 MED ORDER — CEPHALEXIN 500 MG PO CAPS: 500.0000 mg | ORAL_CAPSULE | Freq: Four times a day (QID) | ORAL | 2 refills | Status: DC

## 2019-11-07 NOTE — Progress Notes (Signed)
Pt aware.

## 2019-11-07 NOTE — Progress Notes (Signed)
Let her know UTI found on urine culture from prenatal visit, eRx done for Keflex 4 x daily for 7 days

## 2019-12-01 ENCOUNTER — Encounter: Payer: BC Managed Care – PPO | Admitting: Obstetrics and Gynecology

## 2019-12-01 ENCOUNTER — Other Ambulatory Visit: Payer: BC Managed Care – PPO

## 2019-12-01 ENCOUNTER — Encounter: Payer: Self-pay | Admitting: Obstetrics & Gynecology

## 2019-12-01 ENCOUNTER — Ambulatory Visit (INDEPENDENT_AMBULATORY_CARE_PROVIDER_SITE_OTHER): Payer: BC Managed Care – PPO | Admitting: Obstetrics & Gynecology

## 2019-12-01 ENCOUNTER — Other Ambulatory Visit: Payer: Self-pay

## 2019-12-01 VITALS — BP 100/60 | Wt 172.0 lb

## 2019-12-01 DIAGNOSIS — Z3490 Encounter for supervision of normal pregnancy, unspecified, unspecified trimester: Secondary | ICD-10-CM

## 2019-12-01 DIAGNOSIS — O09299 Supervision of pregnancy with other poor reproductive or obstetric history, unspecified trimester: Secondary | ICD-10-CM

## 2019-12-01 DIAGNOSIS — Z3491 Encounter for supervision of normal pregnancy, unspecified, first trimester: Secondary | ICD-10-CM

## 2019-12-01 DIAGNOSIS — R7303 Prediabetes: Secondary | ICD-10-CM

## 2019-12-01 DIAGNOSIS — O34219 Maternal care for unspecified type scar from previous cesarean delivery: Secondary | ICD-10-CM

## 2019-12-01 DIAGNOSIS — Z3A12 12 weeks gestation of pregnancy: Secondary | ICD-10-CM

## 2019-12-01 DIAGNOSIS — Z3689 Encounter for other specified antenatal screening: Secondary | ICD-10-CM

## 2019-12-01 LAB — POCT URINALYSIS DIPSTICK OB
Glucose, UA: NEGATIVE
POC,PROTEIN,UA: NEGATIVE

## 2019-12-01 NOTE — Progress Notes (Signed)
  Subjective  Fetal Movement? no Pain? no Nausea? no Vaginal Bleeding? no  Objective  BP 100/60   Wt 172 lb (78 kg)   LMP 09/02/2019   BMI 32.50 kg/m  General: NAD Pumonary: no increased work of breathing Abdomen: gravid, non-tender Extremities: no edema Psychiatric: mood appropriate, affect full  Assessment  29 y.o. G2P1001 at [redacted]w[redacted]d by  06/08/2020, by Last Menstrual Period presenting for routine prenatal visit  Plan   Problem List Items Addressed This Visit    Encounter for supervision of low-risk pregnancy, antepartum    PNV    Glucola today   [redacted] weeks gestation of pregnancy      Relevant Orders   POC Urinalysis Dipstick OB (Completed)   Previous cesarean delivery, delivered       Screening, antenatal, for fetal anatomic survey       Relevant Orders   US OB Comp + 14 Wk sch for 20 weeks      Problem Noted Resolved   Encounter for supervision of low-risk pregnancy, antepartum 11/01/2019 by Gae Dry, MD No   Overview Addendum 11/07/2019  7:41 AM by Gae Dry, MD    [facility] Prenatal Labs  Dating LMP = 8 week [place] Blood type: O/Positive/-- (01/05 1625)   Genetic Screen NIPS:declines Antibody:Negative (01/05 1625)  Anatomic [place] At 20 weeks Rubella: 4.46 (01/05 1625) Varicella: Immune  GTT Declines RPR: Non Reactive (01/05 1625)   Rhogam N/A HBsAg: Negative (01/05 1625)   TDaP vaccine             Flu Shot: HIV: Non Reactive (01/05 1625)   Baby Food  Breast                              GBS:   Strep pos in urine, tx labor  Contraception unsure Pap: 11/01/2019 NIL   CBB  No   CS/VBAC Considering TOLAC if EFW lower   Support Person Husband Beckey Downing, MD, [facility], [facility] 12/01/2019  9:11 AM

## 2019-12-01 NOTE — Patient Instructions (Signed)

## 2019-12-02 LAB — GLUCOSE TOLERANCE, 1 HOUR: Glucose, 1Hr PP: 127 mg/dL (ref 65–199)

## 2019-12-29 ENCOUNTER — Encounter: Payer: BC Managed Care – PPO | Admitting: Advanced Practice Midwife

## 2019-12-29 ENCOUNTER — Encounter: Payer: Self-pay | Admitting: Obstetrics and Gynecology

## 2019-12-29 ENCOUNTER — Other Ambulatory Visit: Payer: Self-pay

## 2019-12-29 ENCOUNTER — Ambulatory Visit (INDEPENDENT_AMBULATORY_CARE_PROVIDER_SITE_OTHER): Payer: BC Managed Care – PPO | Admitting: Obstetrics and Gynecology

## 2019-12-29 DIAGNOSIS — Z98891 History of uterine scar from previous surgery: Secondary | ICD-10-CM

## 2019-12-29 DIAGNOSIS — Z3482 Encounter for supervision of other normal pregnancy, second trimester: Secondary | ICD-10-CM

## 2019-12-29 DIAGNOSIS — Z3A16 16 weeks gestation of pregnancy: Secondary | ICD-10-CM

## 2019-12-29 DIAGNOSIS — O34211 Maternal care for low transverse scar from previous cesarean delivery: Secondary | ICD-10-CM

## 2019-12-29 DIAGNOSIS — Z3490 Encounter for supervision of normal pregnancy, unspecified, unspecified trimester: Secondary | ICD-10-CM

## 2019-12-29 NOTE — Progress Notes (Signed)
Routine Prenatal Care Visit- Virtual Visit  Subjective   Virtual Visit via Telephone Note  I connected with Heather Conner on 12/29/19 at  4:30 PM EST by telephone and verified that I am speaking with the correct person using two identifiers.   I discussed the limitations, risks, security and privacy concerns of performing an evaluation and management service by telephone and the availability of in person appointments. I also discussed with the patient that there may be a patient responsible charge related to this service. The patient expressed understanding and agreed to proceed.  The patient was at home I spoke with the patient from my  office The names of people involved in this encounter were: Richardo Priest, Neville Route , and Prentice Docker, MD.   Heather Conner is a 29 y.o. G2P1001 at [redacted]w[redacted]d being seen today for ongoing prenatal care.  She is currently monitored for the following issues for this high-risk pregnancy and has Annual physical exam; Anemia; Mastitis during puerperium; Endometriosis; Eczema; Ovarian cyst; Hematuria; Hidradenitis; Breast mass, right; Prediabetes; Benign cyst of right breast; Encounter for supervision of low-risk pregnancy, antepartum; and History of cesarean delivery on their problem list.  ----------------------------------------------------------------------------------- Patient reports no complaints.    . Vag. Bleeding: None.  Movement: Absent. Denies leaking of fluid.  ----------------------------------------------------------------------------------- The following portions of the patient's history were reviewed and updated as appropriate: allergies, current medications, past family history, past medical history, past social history, past surgical history and problem list. Problem list updated.   Objective  Last menstrual period 09/02/2019, unknown if currently breastfeeding. Pregravid weight 174 lb (78.9 kg) Total Weight Gain -2 lb (-0.907  kg) Urinalysis:      Fetal Status:     Movement: Absent     Physical Exam could not be performed. Because of the COVID-19 outbreak this visit was performed over the phone and not in person.   Assessment   29 y.o. G2P1001 at [redacted]w[redacted]d by  06/08/2020, by Last Menstrual Period presenting for routine prenatal visit  Plan   pregnancy2 Problems (from 09/02/19 to present)    Problem Noted Resolved   Encounter for supervision of low-risk pregnancy, antepartum 11/01/2019 by Gae Dry, MD No   Overview Addendum 11/07/2019  7:41 AM by Gae Dry, MD    [facility] Prenatal Labs  Dating LMP = 8 week [place] Blood type: O/Positive/-- (01/05 1625)   Genetic Screen 1 Screen:    AFP:     Quad:     NIPS: Antibody:Negative (01/05 1625)  Anatomic [place]  Rubella: 4.46 (01/05 1625) Varicella: Immune  GTT Declines RPR: Non Reactive (01/05 1625)   Rhogam N/A HBsAg: Negative (01/05 1625)   TDaP vaccine                       Flu Shot: HIV: Non Reactive (01/05 1625)   Baby Food                                GBS:   Strep pos in urine, tx labor  Contraception  Pap: 11/01/2019 NIL   CBB  No   CS/VBAC Considering TOLAC if EFW lower   Support Person Husband Alex         History of cesarean delivery 11/01/2019 by Gae Dry, MD No   Overview Signed 11/04/2019 11:48 PM by [name], MD    9lbs 11oz FTP  Gestational age appropriate obstetric precautions including but not limited to vaginal bleeding, contractions, leaking of fluid and fetal movement were reviewed in detail with the patient.    Follow Up Instructions: Keep appt on 4/1 for anatomy u/s and routine prenatal  I discussed the assessment and treatment plan with the patient. The patient was provided an opportunity to ask questions and all were answered. The patient agreed with the plan and demonstrated an understanding of the instructions.   The patient was advised to call back or seek an in-person evaluation if the symptoms  worsen or if the condition fails to improve as anticipated.  I provided 12:14 minutes of non-face-to-face time during this encounter.  Return in about 4 weeks (around 01/26/2020) for keep previously scheduled anatomy u/s and routine prenatal appt.  Prentice Docker, MD  [facility], [facility] 12/29/2019 5:35 PM

## 2020-01-26 ENCOUNTER — Ambulatory Visit (INDEPENDENT_AMBULATORY_CARE_PROVIDER_SITE_OTHER): Payer: BC Managed Care – PPO | Admitting: Obstetrics and Gynecology

## 2020-01-26 ENCOUNTER — Encounter: Payer: Self-pay | Admitting: Obstetrics and Gynecology

## 2020-01-26 ENCOUNTER — Other Ambulatory Visit: Payer: Self-pay

## 2020-01-26 ENCOUNTER — Ambulatory Visit (INDEPENDENT_AMBULATORY_CARE_PROVIDER_SITE_OTHER): Payer: BC Managed Care – PPO

## 2020-01-26 VITALS — BP 110/70 | Wt 172.0 lb

## 2020-01-26 DIAGNOSIS — Z3A20 20 weeks gestation of pregnancy: Secondary | ICD-10-CM

## 2020-01-26 DIAGNOSIS — Z3689 Encounter for other specified antenatal screening: Secondary | ICD-10-CM | POA: Diagnosis not present

## 2020-01-26 DIAGNOSIS — Z98891 History of uterine scar from previous surgery: Secondary | ICD-10-CM

## 2020-01-26 DIAGNOSIS — Z3490 Encounter for supervision of normal pregnancy, unspecified, unspecified trimester: Secondary | ICD-10-CM

## 2020-01-26 DIAGNOSIS — Z3492 Encounter for supervision of normal pregnancy, unspecified, second trimester: Secondary | ICD-10-CM

## 2020-01-26 NOTE — Patient Instructions (Signed)

## 2020-01-26 NOTE — Progress Notes (Signed)
    Routine Prenatal Care Visit  Subjective  Heather Conner is a 29 y.o. G2P1001 at [redacted]w[redacted]d being seen today for ongoing prenatal care.  She is currently monitored for the following issues for this low-risk pregnancy and has Annual physical exam; Anemia; Mastitis during puerperium; Endometriosis; Eczema; Ovarian cyst; Hematuria; Hidradenitis; Breast mass, right; Prediabetes; Benign cyst of right breast; Encounter for supervision of low-risk pregnancy, antepartum; and History of cesarean delivery on their problem list.  ----------------------------------------------------------------------------------- Patient reports headache.   Contractions: Not present. Vag. Bleeding: None.  Movement: Present. Denies leaking of fluid.  ----------------------------------------------------------------------------------- The following portions of the patient's history were reviewed and updated as appropriate: allergies, current medications, past family history, past medical history, past social history, past surgical history and problem list. Problem list updated.   Objective  Blood pressure 110/70, weight 172 lb (78 kg), last menstrual period 09/02/2019, unknown if currently breastfeeding. Pregravid weight 174 lb (78.9 kg) Total Weight Gain -2 lb (-0.907 kg) Urinalysis:      Fetal Status: Fetal Heart Rate (bpm): 145   Movement: Present     General:  Alert, oriented and cooperative. Patient is in no acute distress.  Skin: Skin is warm and dry. No rash noted.   Cardiovascular: Normal heart rate noted  Respiratory: Normal respiratory effort, no problems with respiration noted  Abdomen: Soft, gravid, appropriate for gestational age. Pain/Pressure: Absent     Pelvic:  Cervical exam deferred        Extremities: Normal range of motion.  Edema: None  Mental Status: Normal mood and affect. Normal behavior. Normal judgment and thought content.     Assessment   29 y.o. G2P1001 at [redacted]w[redacted]d by  06/08/2020, by Last  Menstrual Period presenting for routine prenatal visit  Plan   pregnancy2 Problems (from 09/02/19 to present)    Problem Noted Resolved   Encounter for supervision of low-risk pregnancy, antepartum 11/01/2019 by Gae Dry, MD No   Overview Addendum 01/26/2020  5:24 PM by Homero Fellers, MD    [facility] Prenatal Labs  Dating LMP = 8 week [place] Blood type: O/Positive/-- (01/05 1625)   Genetic Screen Declines Antibody:Negative (01/05 1625)  Anatomic [place] complete Rubella: 4.46 (01/05 1625) Varicella: Immune  GTT Declines RPR: Non Reactive (01/05 1625)   Rhogam N/A HBsAg: Negative (01/05 1625)   TDaP vaccine                       Flu Shot: HIV: Non Reactive (01/05 1625)   Baby Food Breast/bottle Hx of severe mastitis, leaning towards bottle GBS:   Strep pos in urine, tx labor  Contraception  Pap: 11/01/2019 NIL   CBB  No   CS/VBAC Considering TOLAC if EFW lower   Support Person Husband Alex           History of cesarean delivery 11/01/2019 by Gae Dry, MD No   Overview Signed 11/04/2019 11:48 PM by [name], MD    9lbs 11oz FTP        Covid vaccinations options discussed- patient given resources.   Gestational age appropriate obstetric precautions including but not limited to vaginal bleeding, contractions, leaking of fluid and fetal movement were reviewed in detail with the patient.    Return in about 4 weeks (around 02/23/2020) for ROB in person.  Homero Fellers MD [facility], [facility] 01/26/2020, 5:24 PM

## 2020-01-26 NOTE — Progress Notes (Signed)
ROB/anatomy  C/o some headaches,  Denies lof, no vb, Some FM

## 2020-02-23 ENCOUNTER — Ambulatory Visit (INDEPENDENT_AMBULATORY_CARE_PROVIDER_SITE_OTHER): Payer: BC Managed Care – PPO | Admitting: Obstetrics and Gynecology

## 2020-02-23 ENCOUNTER — Other Ambulatory Visit: Payer: Self-pay

## 2020-02-23 VITALS — BP 110/62 | Wt 176.0 lb

## 2020-02-23 DIAGNOSIS — Z3490 Encounter for supervision of normal pregnancy, unspecified, unspecified trimester: Secondary | ICD-10-CM

## 2020-02-23 DIAGNOSIS — R7303 Prediabetes: Secondary | ICD-10-CM

## 2020-02-23 DIAGNOSIS — Z98891 History of uterine scar from previous surgery: Secondary | ICD-10-CM

## 2020-02-23 DIAGNOSIS — Z3A24 24 weeks gestation of pregnancy: Secondary | ICD-10-CM

## 2020-02-23 NOTE — Progress Notes (Signed)
    Routine Prenatal Care Visit  Subjective  Heather Conner is a 29 y.o. G2P1001 at [redacted]w[redacted]d being seen today for ongoing prenatal care.  She is currently monitored for the following issues for this low-risk pregnancy and has Annual physical exam; Anemia; Mastitis during puerperium; Endometriosis; Eczema; Ovarian cyst; Hematuria; Hidradenitis; Breast mass, right; Prediabetes; Benign cyst of right breast; Encounter for supervision of low-risk pregnancy, antepartum; and History of cesarean delivery on their problem list.  ----------------------------------------------------------------------------------- Patient reports no complaints.   Contractions: Not present. Vag. Bleeding: None.  Movement: Present. Denies leaking of fluid.  ----------------------------------------------------------------------------------- The following portions of the patient's history were reviewed and updated as appropriate: allergies, current medications, past family history, past medical history, past social history, past surgical history and problem list. Problem list updated.   Objective  Blood pressure 110/62, weight 176 lb (79.8 kg), last menstrual period 09/02/2019, unknown if currently breastfeeding. Pregravid weight 174 lb (78.9 kg) Total Weight Gain 2 lb (0.907 kg)  Body mass index is 33.25 kg/m.  Urinalysis:      Fetal Status: Fetal Heart Rate (bpm): 145   Movement: Present     General:  Alert, oriented and cooperative. Patient is in no acute distress.  Skin: Skin is warm and dry. No rash noted.   Cardiovascular: Normal heart rate noted  Respiratory: Normal respiratory effort, no problems with respiration noted  Abdomen: Soft, gravid, appropriate for gestational age. Pain/Pressure: Absent     Pelvic:  Cervical exam deferred        Extremities: Normal range of motion.     ental Status: Normal mood and affect. Normal behavior. Normal judgment and thought content.     Assessment   29 y.o. G2P1001 at  [redacted]w[redacted]d by  06/08/2020, by Last Menstrual Period presenting for routine prenatal visit  Plan   pregnancy2 Problems (from 09/02/19 to present)    Problem Noted Resolved   Encounter for supervision of low-risk pregnancy, antepartum 11/01/2019 by Gae Dry, MD No   Overview Addendum 01/26/2020  5:24 PM by Homero Fellers, MD    [facility] Prenatal Labs  Dating LMP = 8 week [place] Blood type: O/Positive/-- (01/05 1625)   Genetic Screen Declines Antibody:Negative (01/05 1625)  Anatomic [place] complete Rubella: 4.46 (01/05 1625) Varicella: Immune  GTT Declines RPR: Non Reactive (01/05 1625)   Rhogam N/A HBsAg: Negative (01/05 1625)   TDaP vaccine                       Flu Shot: HIV: Non Reactive (01/05 1625)   Baby Food Breast/bottle Hx of severe mastitis, leaning towards bottle GBS:   Strep pos in urine, tx labor  Contraception  Pap: 11/01/2019 NIL   CBB  No   CS/VBAC Considering TOLAC if EFW lower   Support Person Husband Alex           History of cesarean delivery 11/01/2019 by Gae Dry, MD No   Overview Signed 11/04/2019 11:48 PM by [name], MD    9lbs 11oz FTP          Gestational age appropriate obstetric precautions including but not limited to vaginal bleeding, contractions, leaking of fluid and fetal movement were reviewed in detail with the patient.    Return in about 4 weeks (around 03/22/2020) for ROB and 28 week labs.  [name], MD, [facility], [facility] 02/23/2020, 4:39 PM

## 2020-03-23 ENCOUNTER — Other Ambulatory Visit: Payer: Self-pay

## 2020-03-23 ENCOUNTER — Other Ambulatory Visit: Payer: BC Managed Care – PPO

## 2020-03-23 ENCOUNTER — Encounter: Payer: Self-pay | Admitting: Obstetrics and Gynecology

## 2020-03-23 ENCOUNTER — Ambulatory Visit (INDEPENDENT_AMBULATORY_CARE_PROVIDER_SITE_OTHER): Payer: BC Managed Care – PPO | Admitting: Obstetrics and Gynecology

## 2020-03-23 VITALS — BP 122/74 | Wt 180.0 lb

## 2020-03-23 DIAGNOSIS — O0990 Supervision of high risk pregnancy, unspecified, unspecified trimester: Secondary | ICD-10-CM

## 2020-03-23 DIAGNOSIS — O34219 Maternal care for unspecified type scar from previous cesarean delivery: Secondary | ICD-10-CM

## 2020-03-23 DIAGNOSIS — Z3A29 29 weeks gestation of pregnancy: Secondary | ICD-10-CM

## 2020-03-23 DIAGNOSIS — Z3490 Encounter for supervision of normal pregnancy, unspecified, unspecified trimester: Secondary | ICD-10-CM

## 2020-03-23 DIAGNOSIS — Z98891 History of uterine scar from previous surgery: Secondary | ICD-10-CM

## 2020-03-23 NOTE — Progress Notes (Signed)
Routine Prenatal Care Visit  Subjective  Heather Conner is a 29 y.o. G2P1001 at [redacted]w[redacted]d being seen today for ongoing prenatal care.  She is currently monitored for the following issues for this high-risk pregnancy and has Annual physical exam; Anemia; Mastitis during puerperium; Endometriosis; Eczema; Ovarian cyst; Hematuria; Hidradenitis; Breast mass, right; Prediabetes; Benign cyst of right breast; Supervision of high risk pregnancy, antepartum; and History of cesarean section complicating pregnancy on their problem list.  ----------------------------------------------------------------------------------- Patient reports no complaints.   Contractions: Not present. Vag. Bleeding: None.  Movement: Present. Leaking Fluid denies.  ----------------------------------------------------------------------------------- The following portions of the patient's history were reviewed and updated as appropriate: allergies, current medications, past family history, past medical history, past social history, past surgical history and problem list. Problem list updated.  Objective  Blood pressure 122/74, weight 180 lb (81.6 kg), last menstrual period 09/02/2019, unknown if currently breastfeeding. Pregravid weight 174 lb (78.9 kg) Total Weight Gain 6 lb (2.722 kg) Urinalysis: Urine Protein    Urine Glucose    Fetal Status: Fetal Heart Rate (bpm): 140 Fundal Height: 29 cm Movement: Present     General:  Alert, oriented and cooperative. Patient is in no acute distress.  Skin: Skin is warm and dry. No rash noted.   Cardiovascular: Normal heart rate noted  Respiratory: Normal respiratory effort, no problems with respiration noted  Abdomen: Soft, gravid, appropriate for gestational age. Pain/Pressure: Absent     Pelvic:  Cervical exam deferred        Extremities: Normal range of motion.  Edema: None  Mental Status: Normal mood and affect. Normal behavior. Normal judgment and thought content.   Assessment   29  y.o. G2P1001 at [redacted]w[redacted]d by  06/08/2020, by Last Menstrual Period presenting for routine prenatal visit  Plan   pregnancy2 Problems (from 09/02/19 to present)    Problem Noted Resolved   Supervision of high risk pregnancy, antepartum 11/01/2019 by Gae Dry, MD No   Overview Addendum 01/26/2020  5:24 PM by Homero Fellers, MD    [facility] Prenatal Labs  Dating LMP = 8 week [place] Blood type: O/Positive/-- (01/05 1625)   Genetic Screen Declines Antibody:Negative (01/05 1625)  Anatomic [place] complete Rubella: 4.46 (01/05 1625) Varicella: Immune  GTT Declines RPR: Non Reactive (01/05 1625)   Rhogam N/A HBsAg: Negative (01/05 1625)   TDaP vaccine                       Flu Shot: HIV: Non Reactive (01/05 1625)   Baby Food Breast/bottle Hx of severe mastitis, leaning towards bottle GBS:   Strep pos in urine, tx labor  Contraception  Pap: 11/01/2019 NIL   CBB  No   CS/VBAC Considering TOLAC if EFW lower   Support Person Husband Alex           History of cesarean section complicating pregnancy [id number] by Gae Dry, MD No   Overview Signed 11/04/2019 11:48 PM by [name], MD    9lbs 11oz FTP          Preterm labor symptoms and general obstetric precautions including but not limited to vaginal bleeding, contractions, leaking of fluid and fetal movement were reviewed in detail with the patient. Please refer to After Visit Summary for other counseling recommendations.   - 28 week labs today - discussed TOLAC vs RCS.   - plan to get u/s at 36 wks and decide from there.   Return in about 2 weeks (  around 04/06/2020) for Routine Prenatal Appointment.  Prentice Docker, MD, [facility], [facility] 03/23/2020 3:22 PM

## 2020-03-24 LAB — 28 WEEK RH+PANEL
Basophils Absolute: 0 10*3/uL (ref 0.0–0.2)
Basos: 0 %
EOS (ABSOLUTE): 0.1 10*3/uL (ref 0.0–0.4)
Eos: 1 %
Gestational Diabetes Screen: 117 mg/dL (ref 65–139)
HIV Screen 4th Generation wRfx: NONREACTIVE
Hematocrit: 27.5 % — ABNORMAL LOW (ref 34.0–46.6)
Hemoglobin: 9.4 g/dL — ABNORMAL LOW (ref 11.1–15.9)
Immature Grans (Abs): 0.1 10*3/uL (ref 0.0–0.1)
Immature Granulocytes: 1 %
Lymphocytes Absolute: 2.1 10*3/uL (ref 0.7–3.1)
Lymphs: 20 %
MCH: 32.2 pg (ref 26.6–33.0)
MCHC: 34.2 g/dL (ref 31.5–35.7)
MCV: 94 fL (ref 79–97)
Monocytes Absolute: 0.8 10*3/uL (ref 0.1–0.9)
Monocytes: 7 %
Neutrophils Absolute: 7.6 10*3/uL — ABNORMAL HIGH (ref 1.4–7.0)
Neutrophils: 71 %
Platelets: 211 10*3/uL (ref 150–450)
RBC: 2.92 x10E6/uL — ABNORMAL LOW (ref 3.77–5.28)
RDW: 12 % (ref 11.7–15.4)
RPR Ser Ql: NONREACTIVE
WBC: 10.7 10*3/uL (ref 3.4–10.8)

## 2020-03-28 ENCOUNTER — Other Ambulatory Visit: Payer: Self-pay | Admitting: Obstetrics and Gynecology

## 2020-03-28 MED ORDER — FERROUS SULFATE 325 (65 FE) MG PO TABS: 325.0000 mg | ORAL_TABLET | Freq: Every day | ORAL | 3 refills | Status: DC

## 2020-04-05 ENCOUNTER — Other Ambulatory Visit: Payer: Self-pay

## 2020-04-05 ENCOUNTER — Ambulatory Visit (INDEPENDENT_AMBULATORY_CARE_PROVIDER_SITE_OTHER): Payer: BC Managed Care – PPO | Admitting: Obstetrics and Gynecology

## 2020-04-05 VITALS — BP 110/67 | Wt 181.0 lb

## 2020-04-05 DIAGNOSIS — O99013 Anemia complicating pregnancy, third trimester: Secondary | ICD-10-CM | POA: Insufficient documentation

## 2020-04-05 DIAGNOSIS — O0993 Supervision of high risk pregnancy, unspecified, third trimester: Secondary | ICD-10-CM

## 2020-04-05 DIAGNOSIS — O0990 Supervision of high risk pregnancy, unspecified, unspecified trimester: Secondary | ICD-10-CM

## 2020-04-05 DIAGNOSIS — Z23 Encounter for immunization: Secondary | ICD-10-CM

## 2020-04-05 DIAGNOSIS — Z3A30 30 weeks gestation of pregnancy: Secondary | ICD-10-CM

## 2020-04-05 DIAGNOSIS — O34219 Maternal care for unspecified type scar from previous cesarean delivery: Secondary | ICD-10-CM

## 2020-04-05 LAB — POCT URINALYSIS DIPSTICK OB
Glucose, UA: NEGATIVE
POC,PROTEIN,UA: NEGATIVE

## 2020-04-05 NOTE — Progress Notes (Signed)
ROB °TDAP today ° °

## 2020-04-05 NOTE — Progress Notes (Signed)
Routine Prenatal Care Visit  Subjective  Heather Conner is a 29 y.o. G2P1001 at [redacted]w[redacted]d being seen today for ongoing prenatal care.  She is currently monitored for the following issues for this low-risk pregnancy and has Annual physical exam; Anemia; Mastitis during puerperium; Endometriosis; Eczema; Ovarian cyst; Hematuria; Hidradenitis; Breast mass, right; Prediabetes; Benign cyst of right breast; Supervision of high risk pregnancy, antepartum; History of cesarean section complicating pregnancy; and Anemia affecting pregnancy in third trimester on their problem list.  ----------------------------------------------------------------------------------- Patient reports no complaints.   Contractions: Not present. Vag. Bleeding: None.  Movement: Present. Denies leaking of fluid.  ----------------------------------------------------------------------------------- The following portions of the patient's history were reviewed and updated as appropriate: allergies, current medications, past family history, past medical history, past social history, past surgical history and problem list. Problem list updated.   Objective  Blood pressure 110/67, weight 181 lb (82.1 kg), last menstrual period 09/02/2019, unknown if currently breastfeeding. Pregravid weight 174 lb (78.9 kg) Total Weight Gain 7 lb (3.175 kg) Urinalysis:      Fetal Status: Fetal Heart Rate (bpm): 135 Fundal Height: 30 cm Movement: Present  Presentation: Vertex  General:  Alert, oriented and cooperative. Patient is in no acute distress.  Skin: Skin is warm and dry. No rash noted.   Cardiovascular: Normal heart rate noted  Respiratory: Normal respiratory effort, no problems with respiration noted  Abdomen: Soft, gravid, appropriate for gestational age. Pain/Pressure: Absent     Pelvic:  Cervical exam deferred        Extremities: Normal range of motion.     ental Status: Normal mood and affect. Normal behavior. Normal judgment and  thought content.     Assessment   29 y.o. G2P1001 at [redacted]w[redacted]d by  06/08/2020, by Last Menstrual Period presenting for routine prenatal visit  Plan   pregnancy2 Problems (from 09/02/19 to present)    Problem Noted Resolved   Supervision of high risk pregnancy, antepartum 11/01/2019 by Gae Dry, MD No   Overview Addendum 01/26/2020  5:24 PM by Homero Fellers, MD    Clinic [facility] Prenatal Labs  Dating LMP = 8 week [place] Blood type: O/Positive/-- (01/05 1625)   Genetic Screen Declines Antibody:Negative (01/05 1625)  Anatomic [place] complete Rubella: 4.46 (01/05 1625) Varicella: Immune  GTT 117 RPR: Non Reactive (01/05 1625)   Rhogam N/A HBsAg: Negative (01/05 1625)   TDaP vaccine                       Flu Shot: HIV: Non Reactive (01/05 1625)   Baby Food Breast/bottle Hx of severe mastitis, leaning towards bottle GBS:   Strep pos in urine, tx labor  Contraception  Pap: 11/01/2019 NIL   CBB  No   CS/VBAC Considering TOLAC if EFW lower   Support Person Husband Alex           Previous Version   History of cesarean section complicating pregnancy 03/29/9527 by Gae Dry, MD No   Overview Signed 11/04/2019 11:48 PM by [name], MD    9lbs 11oz FTP          Gestational age appropriate obstetric precautions including but not limited to vaginal bleeding, contractions, leaking of fluid and fetal movement were reviewed in detail with the patient.    - TDAP today - Tolerating po iron well  Return in about 2 weeks (around 04/19/2020) for ROB.  [name], MD, [facility], [facility] 04/05/2020, 2:57  PM

## 2020-04-18 ENCOUNTER — Ambulatory Visit (INDEPENDENT_AMBULATORY_CARE_PROVIDER_SITE_OTHER): Payer: BC Managed Care – PPO | Admitting: Obstetrics and Gynecology

## 2020-04-18 ENCOUNTER — Other Ambulatory Visit: Payer: Self-pay

## 2020-04-18 VITALS — BP 100/60 | Wt 183.0 lb

## 2020-04-18 DIAGNOSIS — O0990 Supervision of high risk pregnancy, unspecified, unspecified trimester: Secondary | ICD-10-CM

## 2020-04-18 DIAGNOSIS — O09299 Supervision of pregnancy with other poor reproductive or obstetric history, unspecified trimester: Secondary | ICD-10-CM | POA: Insufficient documentation

## 2020-04-18 DIAGNOSIS — O34219 Maternal care for unspecified type scar from previous cesarean delivery: Secondary | ICD-10-CM

## 2020-04-18 DIAGNOSIS — O99013 Anemia complicating pregnancy, third trimester: Secondary | ICD-10-CM

## 2020-04-18 NOTE — Progress Notes (Signed)
Routine Prenatal Care Visit  Subjective  Heather Conner is a 29 y.o. G2P1001 at [redacted]w[redacted]d being seen today for ongoing prenatal care.  She is currently monitored for the following issues for this low-risk pregnancy and has Annual physical exam; Anemia; Mastitis during puerperium; Endometriosis; Eczema; Ovarian cyst; Hematuria; Hidradenitis; Breast mass, right; Prediabetes; Benign cyst of right breast; Supervision of high risk pregnancy, antepartum; History of cesarean section complicating pregnancy; Anemia affecting pregnancy in third trimester; and History of macrosomia in infant in prior pregnancy, currently pregnant on their problem list.  ----------------------------------------------------------------------------------- Patient reports no complaints.   Contractions: Not present. Vag. Bleeding: None.  Movement: Present. Denies leaking of fluid.  ----------------------------------------------------------------------------------- The following portions of the patient's history were reviewed and updated as appropriate: allergies, current medications, past family history, past medical history, past social history, past surgical history and problem list. Problem list updated.   Objective  Blood pressure 100/60, weight 183 lb (83 kg), last menstrual period 09/02/2019, unknown if currently breastfeeding. Pregravid weight 174 lb (78.9 kg) Total Weight Gain 9 lb (4.082 kg) Urinalysis:      Fetal Status: Fetal Heart Rate (bpm): 140 Fundal Height: 32 cm Movement: Present     General:  Alert, oriented and cooperative. Patient is in no acute distress.  Skin: Skin is warm and dry. No rash noted.   Cardiovascular: Normal heart rate noted  Respiratory: Normal respiratory effort, no problems with respiration noted  Abdomen: Soft, gravid, appropriate for gestational age. Pain/Pressure: Absent     Pelvic:  Cervical exam deferred        Extremities: Normal range of motion.     ental Status: Normal mood and  affect. Normal behavior. Normal judgment and thought content.     Assessment   29 y.o. G2P1001 at [redacted]w[redacted]d by  06/08/2020, by Last Menstrual Period presenting for routine prenatal visit  Plan   pregnancy2 Problems (from 09/02/19 to present)    Problem Noted Resolved   Anemia affecting pregnancy in third trimester 04/05/2020 by [name], MD No   Supervision of high risk pregnancy, antepartum 11/01/2019 by Gae Dry, MD No   Overview Addendum 04/05/2020  2:57 PM by [name], MD    [facility]inic [facility] Prenatal Labs  Dating LMP = 8 week [place] Blood type: O/Positive/-- (01/05 1625)   Genetic Screen Declines Antibody:Negative (01/05 1625)  Anatomic [place] complete Rubella: 4.46 (01/05 1625) Varicella: Immune  GTT 117 RPR: Non Reactive (01/05 1625)   Rhogam N/A HBsAg: Negative (01/05 1625)   TDaP vaccine 04/05/20   Flu Shot: HIV: Non Reactive (01/05 1625)   Baby Food Breast/bottle Hx of severe mastitis, leaning towards bottle GBS:   Strep pos in urine, tx labor  Contraception  Pap: 11/01/2019 NIL   CBB  No   CS/VBAC Considering TOLAC if EFW lower   Support Person Husband Alex           Previous Version   History of cesarean section complicating pregnancy 02/24/257 by Gae Dry, MD No   Overview Signed 11/04/2019 11:48 PM by [name], MD    9lbs 11oz FTP          Gestational age appropriate obstetric precautions including but not limited to vaginal bleeding, contractions, leaking of fluid and fetal movement were reviewed in detail with the patient.    Return in about 2 weeks (around 05/02/2020) for ROB 1.5 weeks, the ROB and growth scan in 3 weeks.  [name], MD, [facility], [facility] 04/18/2020, 2:23 PM

## 2020-04-25 ENCOUNTER — Other Ambulatory Visit: Payer: Self-pay

## 2020-04-25 ENCOUNTER — Ambulatory Visit (INDEPENDENT_AMBULATORY_CARE_PROVIDER_SITE_OTHER): Payer: BC Managed Care – PPO | Admitting: Obstetrics and Gynecology

## 2020-04-25 VITALS — BP 123/77 | HR 114 | Wt 183.0 lb

## 2020-04-25 DIAGNOSIS — O09299 Supervision of pregnancy with other poor reproductive or obstetric history, unspecified trimester: Secondary | ICD-10-CM

## 2020-04-25 DIAGNOSIS — O0990 Supervision of high risk pregnancy, unspecified, unspecified trimester: Secondary | ICD-10-CM

## 2020-04-25 DIAGNOSIS — O99013 Anemia complicating pregnancy, third trimester: Secondary | ICD-10-CM

## 2020-04-25 DIAGNOSIS — O34219 Maternal care for unspecified type scar from previous cesarean delivery: Secondary | ICD-10-CM

## 2020-04-25 DIAGNOSIS — O0993 Supervision of high risk pregnancy, unspecified, third trimester: Secondary | ICD-10-CM

## 2020-04-25 DIAGNOSIS — Z3A33 33 weeks gestation of pregnancy: Secondary | ICD-10-CM

## 2020-04-25 LAB — POCT URINALYSIS DIPSTICK OB
Glucose, UA: NEGATIVE
POC,PROTEIN,UA: NEGATIVE

## 2020-04-25 MED ORDER — OMEPRAZOLE 20 MG PO CPDR
20.0000 mg | DELAYED_RELEASE_CAPSULE | Freq: Every day | ORAL | 11 refills | Status: DC
Start: 2020-04-25 — End: 2020-06-04

## 2020-04-25 NOTE — Progress Notes (Signed)
ROB

## 2020-04-25 NOTE — Progress Notes (Addendum)
Routine Prenatal Care Visit  Subjective  Heather Conner is a 29 y.o. G2P1001 at [redacted]w[redacted]d being seen today for ongoing prenatal care.  She is currently monitored for the following issues for this low-risk pregnancy and has Annual physical exam; Anemia; Mastitis during puerperium; Endometriosis; Eczema; Ovarian cyst; Hematuria; Hidradenitis; Breast mass, right; Prediabetes; Benign cyst of right breast; Supervision of high risk pregnancy, antepartum; History of cesarean section complicating pregnancy; Anemia affecting pregnancy in third trimester; and History of macrosomia in infant in prior pregnancy, currently pregnant on their problem list.  ----------------------------------------------------------------------------------- Patient reports no complaints.   Contractions: Not present. Vag. Bleeding: None.  Movement: Present. Denies leaking of fluid.  ----------------------------------------------------------------------------------- The following portions of the patient's history were reviewed and updated as appropriate: allergies, current medications, past family history, past medical history, past social history, past surgical history and problem list. Problem list updated.   Objective  Blood pressure 123/77, pulse (!) 114, weight 183 lb (83 kg), last menstrual period 09/02/2019, unknown if currently breastfeeding. Pregravid weight 174 lb (78.9 kg) Total Weight Gain 9 lb (4.082 kg) Urinalysis:      Fetal Status: Fetal Heart Rate (bpm): 145 Fundal Height: 33 cm Movement: Present  Presentation: Vertex  General:  Alert, oriented and cooperative. Patient is in no acute distress.  Skin: Skin is warm and dry. No rash noted.   Cardiovascular: Normal heart rate noted  Respiratory: Normal respiratory effort, no problems with respiration noted  Abdomen: Soft, gravid, appropriate for gestational age. Pain/Pressure: Absent     Pelvic:  Cervical exam deferred        Extremities: Normal range of motion.      ental Status: Normal mood and affect. Normal behavior. Normal judgment and thought content.     Assessment   29 y.o. G2P1001 at [redacted]w[redacted]d by  06/08/2020, by Last Menstrual Period presenting for routine prenatal visit  Plan   pregnancy2 Problems (from 09/02/19 to present)    Problem Noted Resolved   Anemia affecting pregnancy in third trimester 04/05/2020 by [name], MD No   Supervision of high risk pregnancy, antepartum 11/01/2019 by Gae Dry, MD No   Overview Addendum 04/05/2020  2:57 PM by [name], MD    [facility]inic [facility] Prenatal Labs  Dating LMP = 8 week [place] Blood type: O/Positive/-- (01/05 1625)   Genetic Screen Declines Antibody:Negative (01/05 1625)  Anatomic [place] complete Rubella: 4.46 (01/05 1625) Varicella: Immune  GTT 117 RPR: Non Reactive (01/05 1625)   Rhogam N/A HBsAg: Negative (01/05 1625)   TDaP vaccine 04/05/20   Flu Shot: HIV: Non Reactive (01/05 1625)   Baby Food Breast/bottle Hx of severe mastitis, leaning towards bottle GBS:   Strep pos in urine, tx labor  Contraception  Pap: 11/01/2019 NIL   CBB  No   CS/VBAC Considering TOLAC if EFW lower   Support Person Husband Alex           Previous Version   History of cesarean section complicating pregnancy 5/0/2774 by Gae Dry, MD No   Overview Signed 11/04/2019 11:48 PM by [name], MD    9lbs 11oz FTP          Gestational age appropriate obstetric precautions including but not limited to vaginal bleeding, contractions, leaking of fluid and fetal movement were reviewed in detail with the patient.    - GERD start omeprazole - Growth scan next visit secondary to history of macrosomia  Return in about 2 weeks (around 05/09/2020) for [place]  and growth scan.  [name], MD, [facility], [facility] 04/25/2020, 2:28 PM

## 2020-05-08 ENCOUNTER — Ambulatory Visit (INDEPENDENT_AMBULATORY_CARE_PROVIDER_SITE_OTHER): Payer: BC Managed Care – PPO

## 2020-05-08 ENCOUNTER — Ambulatory Visit (INDEPENDENT_AMBULATORY_CARE_PROVIDER_SITE_OTHER): Payer: BC Managed Care – PPO | Admitting: Obstetrics and Gynecology

## 2020-05-08 ENCOUNTER — Other Ambulatory Visit: Payer: Self-pay

## 2020-05-08 VITALS — BP 104/76 | Wt 184.0 lb

## 2020-05-08 DIAGNOSIS — O99013 Anemia complicating pregnancy, third trimester: Secondary | ICD-10-CM

## 2020-05-08 DIAGNOSIS — O09299 Supervision of pregnancy with other poor reproductive or obstetric history, unspecified trimester: Secondary | ICD-10-CM | POA: Diagnosis not present

## 2020-05-08 DIAGNOSIS — O34219 Maternal care for unspecified type scar from previous cesarean delivery: Secondary | ICD-10-CM

## 2020-05-08 DIAGNOSIS — O0990 Supervision of high risk pregnancy, unspecified, unspecified trimester: Secondary | ICD-10-CM | POA: Diagnosis not present

## 2020-05-08 DIAGNOSIS — Z3A35 35 weeks gestation of pregnancy: Secondary | ICD-10-CM

## 2020-05-08 DIAGNOSIS — O09293 Supervision of pregnancy with other poor reproductive or obstetric history, third trimester: Secondary | ICD-10-CM

## 2020-05-08 DIAGNOSIS — O0993 Supervision of high risk pregnancy, unspecified, third trimester: Secondary | ICD-10-CM

## 2020-05-08 LAB — POCT URINALYSIS DIPSTICK OB
Glucose, UA: NEGATIVE
POC,PROTEIN,UA: NEGATIVE

## 2020-05-08 NOTE — Progress Notes (Signed)
ROB Growth scan today

## 2020-05-08 NOTE — Progress Notes (Signed)
Routine Prenatal Care Visit  Subjective  Heather Conner is a 29 y.o. G2P1001 at [redacted]w[redacted]d being seen today for ongoing prenatal care.  She is currently monitored for the following issues for this low-risk pregnancy and has Annual physical exam; Anemia; Mastitis during puerperium; Endometriosis; Eczema; Ovarian cyst; Hematuria; Hidradenitis; Breast mass, right; Prediabetes; Benign cyst of right breast; Supervision of high risk pregnancy, antepartum; History of cesarean section complicating pregnancy; Anemia affecting pregnancy in third trimester; and History of macrosomia in infant in prior pregnancy, currently pregnant on their problem list.  ----------------------------------------------------------------------------------- Patient reports no complaints.   Contractions: Irregular. Vag. Bleeding: None.  Movement: Present. Denies leaking of fluid.  ----------------------------------------------------------------------------------- The following portions of the patient's history were reviewed and updated as appropriate: allergies, current medications, past family history, past medical history, past social history, past surgical history and problem list. Problem list updated.   Objective  Blood pressure 104/76, weight 184 lb (83.5 kg), last menstrual period 09/02/2019, unknown if currently breastfeeding. Pregravid weight 174 lb (78.9 kg) Total Weight Gain 10 lb (4.536 kg) Urinalysis:      Fetal Status: Fetal Heart Rate (bpm): 135 Fundal Height: 35 cm Movement: Present  Presentation: Vertex  General:  Alert, oriented and cooperative. Patient is in no acute distress.  Skin: Skin is warm and dry. No rash noted.   Cardiovascular: Normal heart rate noted  Respiratory: Normal respiratory effort, no problems with respiration noted  Abdomen: Soft, gravid, appropriate for gestational age. Pain/Pressure: Present     Pelvic:  Cervical exam deferred        Extremities: Normal range of motion.     ental  Status: Normal mood and affect. Normal behavior. Normal judgment and thought content.   US OB Follow Up  Result Date: 05/08/2020 Patient Name: Heather Conner DOB: Nov 30, 1990 MRN: [id number] ULTRASOUND REPORT Location: [facility] Date of Service: 05/08/2020 Indications:growth/afi Findings: [name] intrauterine pregnancy is visualized with FHR at 127 BPM. Biometrics give an (U/S) Gestational age of [redacted]w[redacted]d and an (U/S) EDD of 06/11/2020; this correlates with the clinically established Estimated Date of Delivery: 06/08/20. Fetal presentation is Cephalic. Placenta: posterior. Grade: 2 AFI: 14.0 cm Growth percentile is 37.8%.  AC percentile is 63.7%. EFW: 2600 g  ( 5 lb 12 oz ) Impression: 1. [redacted]w[redacted]d Viable Singleton Intrauterine pregnancy previously established criteria. 2. Growth is 37.8 %ile.  AFI is 14.0 cm. Recommendations: 1.Clinical correlation with the patient's History and Physical Exam. [name], RT There is a singleton gestation with normal amniotic fluid volume. The fetal biometry correlates with established dating.  Limited fetal anatomy was performed.The visualized fetal anatomical survey appears within normal limits within the resolution of ultrasound as described above.  It must be noted that a normal ultrasound is unable to rule out fetal aneuploidy.  [name], MD, [facility], [facility] 05/08/2020, 9:52 AM     Assessment   29 y.o. G2P1001 at [redacted]w[redacted]d by  06/08/2020, by Last Menstrual Period presenting for routine prenatal visit  Plan   pregnancy2 Problems (from 09/02/19 to present)    Problem Noted Resolved   Anemia affecting pregnancy in third trimester 04/05/2020 by [name], MD No   Supervision of high risk pregnancy, antepartum 11/01/2019 by Gae Dry, MD No   Overview Addendum 04/05/2020  2:57 PM by [name], MD    [facility]inic [facility] Prenatal Labs  Dating LMP = 8 week [place] Blood type: O/Positive/-- (01/05 1625)   Genetic Screen  Declines  Antibody:Negative (01/05 1625)  Anatomic [place] complete Rubella: 4.46 (01/05 1625) Varicella: Immune  GTT 117 RPR: Non Reactive (01/05 1625)   Rhogam N/A HBsAg: Negative (01/05 1625)   TDaP vaccine 04/05/20   Flu Shot: HIV: Non Reactive (01/05 1625)   Baby Food Breast/bottle Hx of severe mastitis, leaning towards bottle GBS:   Strep pos in urine, tx labor  Contraception  Pap: 11/01/2019 NIL   CBB  No   CS/VBAC Considering TOLAC if EFW lower   Support Person Husband Alex           Previous Version   History of cesarean section complicating pregnancy 12/28/3830 by Gae Dry, MD No   Overview Signed 11/04/2019 11:48 PM by [name], MD    9lbs 11oz FTP          Gestational age appropriate obstetric precautions including but not limited to vaginal bleeding, contractions, leaking of fluid and fetal movement were reviewed in detail with the patient.    Return in about 1 week (around 05/15/2020) for ROB.  [name], MD, [facility], [facility] 05/08/2020, 10:05 AM

## 2020-05-16 ENCOUNTER — Ambulatory Visit (INDEPENDENT_AMBULATORY_CARE_PROVIDER_SITE_OTHER): Payer: BC Managed Care – PPO | Admitting: Obstetrics and Gynecology

## 2020-05-16 VITALS — BP 120/80 | Wt 187.0 lb

## 2020-05-16 DIAGNOSIS — O0990 Supervision of high risk pregnancy, unspecified, unspecified trimester: Secondary | ICD-10-CM

## 2020-05-16 DIAGNOSIS — O34219 Maternal care for unspecified type scar from previous cesarean delivery: Secondary | ICD-10-CM

## 2020-05-16 DIAGNOSIS — O99013 Anemia complicating pregnancy, third trimester: Secondary | ICD-10-CM

## 2020-05-16 DIAGNOSIS — Z3685 Encounter for antenatal screening for Streptococcus B: Secondary | ICD-10-CM

## 2020-05-16 DIAGNOSIS — O09299 Supervision of pregnancy with other poor reproductive or obstetric history, unspecified trimester: Secondary | ICD-10-CM

## 2020-05-16 DIAGNOSIS — Z3A36 36 weeks gestation of pregnancy: Secondary | ICD-10-CM

## 2020-05-16 NOTE — Progress Notes (Signed)
Routine Prenatal Care Visit  Subjective  Heather Conner is a 29 y.o. G2P1001 at [redacted]w[redacted]d being seen today for ongoing prenatal care.  She is currently monitored for the following issues for this low-risk pregnancy and has Annual physical exam; Anemia; Mastitis during puerperium; Endometriosis; Eczema; Ovarian cyst; Hematuria; Hidradenitis; Breast mass, right; Prediabetes; Benign cyst of right breast; Supervision of high risk pregnancy, antepartum; History of cesarean section complicating pregnancy; Anemia affecting pregnancy in third trimester; and History of macrosomia in infant in prior pregnancy, currently pregnant on their problem list.  ----------------------------------------------------------------------------------- Patient reports no complaints.   Contractions: Irregular. Vag. Bleeding: None.  Movement: Present. Denies leaking of fluid.  ----------------------------------------------------------------------------------- The following portions of the patient's history were reviewed and updated as appropriate: allergies, current medications, past family history, past medical history, past social history, past surgical history and problem list. Problem list updated.   Objective  Blood pressure 120/80, weight 187 lb (84.8 kg), last menstrual period 09/02/2019, unknown if currently breastfeeding. Pregravid weight 174 lb (78.9 kg) Total Weight Gain 13 lb (5.897 kg) Urinalysis:      Fetal Status: Fetal Heart Rate (bpm): 140 Fundal Height: 36 cm Movement: Present  Presentation: Vertex  General:  Alert, oriented and cooperative. Patient is in no acute distress.  Skin: Skin is warm and dry. No rash noted.   Cardiovascular: Normal heart rate noted  Respiratory: Normal respiratory effort, no problems with respiration noted  Abdomen: Soft, gravid, appropriate for gestational age. Pain/Pressure: Present     Pelvic:  Cervical exam performed Dilation: Closed      Extremities: Normal range of  motion.     ental Status: Normal mood and affect. Normal behavior. Normal judgment and thought content.     Assessment   29 y.o. G2P1001 at [redacted]w[redacted]d by  06/08/2020, by Last Menstrual Period presenting for routine prenatal visit  Plan   pregnancy2 Problems (from 09/02/19 to present)    Problem Noted Resolved   Anemia affecting pregnancy in third trimester 04/05/2020 by [name], MD No   Supervision of high risk pregnancy, antepartum 11/01/2019 by Gae Dry, MD No   Overview Addendum 04/05/2020  2:57 PM by [name], MD    [facility]inic [facility] Prenatal Labs  Dating LMP = 8 week [place] Blood type: O/Positive/-- (01/05 1625)   Genetic Screen Declines Antibody:Negative (01/05 1625)  Anatomic [place] complete Rubella: 4.46 (01/05 1625) Varicella: Immune  GTT 117 RPR: Non Reactive (01/05 1625)   Rhogam N/A HBsAg: Negative (01/05 1625)   TDaP vaccine 04/05/20   Flu Shot: HIV: Non Reactive (01/05 1625)   Baby Food Breast/bottle Hx of severe mastitis, leaning towards bottle GBS:   Strep pos in urine, tx labor  Contraception  Pap: 11/01/2019 NIL   CBB  No   CS/VBAC Considering TOLAC if EFW lower   Support Person Husband Alex           Previous Version   History of cesarean section complicating pregnancy 3/0/0762 by Gae Dry, MD No   Overview Signed 11/04/2019 11:48 PM by [name], MD    9lbs 11oz FTP          Gestational age appropriate obstetric precautions including but not limited to vaginal bleeding, contractions, leaking of fluid and fetal movement were reviewed in detail with the patient.    GBS collected  Return in about 1 week (around 05/23/2020) for ROB.  [name], MD, [facility], [facility] 05/16/2020, 11:47 AM

## 2020-05-18 LAB — STREP GP B NAA: Strep Gp B NAA: NEGATIVE

## 2020-05-25 ENCOUNTER — Ambulatory Visit (INDEPENDENT_AMBULATORY_CARE_PROVIDER_SITE_OTHER): Payer: BC Managed Care – PPO | Admitting: Obstetrics and Gynecology

## 2020-05-25 ENCOUNTER — Other Ambulatory Visit: Payer: Self-pay

## 2020-05-25 VITALS — BP 124/68 | Wt 188.0 lb

## 2020-05-25 DIAGNOSIS — O0990 Supervision of high risk pregnancy, unspecified, unspecified trimester: Secondary | ICD-10-CM

## 2020-05-25 DIAGNOSIS — O09299 Supervision of pregnancy with other poor reproductive or obstetric history, unspecified trimester: Secondary | ICD-10-CM

## 2020-05-25 DIAGNOSIS — O34219 Maternal care for unspecified type scar from previous cesarean delivery: Secondary | ICD-10-CM

## 2020-05-25 DIAGNOSIS — O99013 Anemia complicating pregnancy, third trimester: Secondary | ICD-10-CM

## 2020-05-25 NOTE — Progress Notes (Signed)
Routine Prenatal Care Visit  Subjective  Heather Conner is a 29 y.o. G2P1001 at [redacted]w[redacted]d being seen today for ongoing prenatal care.  She is currently monitored for the following issues for this low-risk pregnancy and has Annual physical exam; Anemia; Mastitis during puerperium; Endometriosis; Eczema; Ovarian cyst; Hematuria; Hidradenitis; Breast mass, right; Prediabetes; Benign cyst of right breast; Supervision of high risk pregnancy, antepartum; History of cesarean section complicating pregnancy; Anemia affecting pregnancy in third trimester; and History of macrosomia in infant in prior pregnancy, currently pregnant on their problem list.  ----------------------------------------------------------------------------------- Patient reports no complaints.   Contractions: Irregular. Vag. Bleeding: None.  Movement: Present. Denies leaking of fluid.  ----------------------------------------------------------------------------------- The following portions of the patient's history were reviewed and updated as appropriate: allergies, current medications, past family history, past medical history, past social history, past surgical history and problem list. Problem list updated.   Objective  Blood pressure 124/68, weight 188 lb (85.3 kg), last menstrual period 09/02/2019, unknown if currently breastfeeding. Pregravid weight 174 lb (78.9 kg) Total Weight Gain 14 lb (6.35 kg) Urinalysis:      Fetal Status: Fetal Heart Rate (bpm): 135 Fundal Height: 37 cm Movement: Present  Presentation: Vertex  General:  Alert, oriented and cooperative. Patient is in no acute distress.  Skin: Skin is warm and dry. No rash noted.   Cardiovascular: Normal heart rate noted  Respiratory: Normal respiratory effort, no problems with respiration noted  Abdomen: Soft, gravid, appropriate for gestational age. Pain/Pressure: Present     Pelvic:  Cervical exam performed Dilation: Fingertip Effacement (%): 30 Station: -3    Extremities: Normal range of motion.     ental Status: Normal mood and affect. Normal behavior. Normal judgment and thought content.     Assessment   29 y.o. G2P1001 at [redacted]w[redacted]d by  06/08/2020, by Last Menstrual Period presenting for routine prenatal visit  Plan   pregnancy2 Problems (from 09/02/19 to present)    Problem Noted Resolved   Anemia affecting pregnancy in third trimester 04/05/2020 by [name], MD No   Supervision of high risk pregnancy, antepartum 11/01/2019 by Gae Dry, MD No   Overview Addendum 04/05/2020  2:57 PM by [name], MD    [facility]inic [facility] Prenatal Labs  Dating LMP = 8 week [place] Blood type: O/Positive/-- (01/05 1625)   Genetic Screen Declines Antibody:Negative (01/05 1625)  Anatomic [place] complete Rubella: 4.46 (01/05 1625) Varicella: Immune  GTT 117 RPR: Non Reactive (01/05 1625)   Rhogam N/A HBsAg: Negative (01/05 1625)   TDaP vaccine 04/05/20   Flu Shot: HIV: Non Reactive (01/05 1625)   Baby Food Breast/bottle Hx of severe mastitis, leaning towards bottle GBS:   Strep pos in urine, tx labor  Contraception  Pap: 11/01/2019 NIL   CBB  No   CS/VBAC Considering TOLAC if EFW lower   Support Person Husband Alex           Previous Version   History of cesarean section complicating pregnancy 02/27/85 by Gae Dry, MD No   Overview Signed 11/04/2019 11:48 PM by [name], MD    9lbs 11oz FTP          Gestational age appropriate obstetric precautions including but not limited to vaginal bleeding, contractions, leaking of fluid and fetal movement were reviewed in detail with the patient.    Discussed recheck next week if no further dilation at that point leaning toward repeat C-section at 40 weeks  Return in about 1 week (around 06/01/2020)  for ROB.  [name], MD, [facility], [facility] 05/25/2020, 10:47 AM

## 2020-05-25 NOTE — Progress Notes (Signed)
ROB

## 2020-06-01 ENCOUNTER — Inpatient Hospital Stay: Payer: BC Managed Care – PPO | Admitting: Anesthesiology

## 2020-06-01 ENCOUNTER — Inpatient Hospital Stay
Admission: EM | Admit: 2020-06-01 | Discharge: 2020-06-04 | DRG: 787 | Disposition: A | Discharge status: Home / Self Care | Payer: BC Managed Care – PPO | Attending: Obstetrics and Gynecology | Admitting: Obstetrics and Gynecology

## 2020-06-01 ENCOUNTER — Ambulatory Visit (INDEPENDENT_AMBULATORY_CARE_PROVIDER_SITE_OTHER): Payer: BC Managed Care – PPO | Admitting: Obstetrics and Gynecology

## 2020-06-01 ENCOUNTER — Encounter: Payer: Self-pay | Admitting: Obstetrics and Gynecology

## 2020-06-01 ENCOUNTER — Other Ambulatory Visit: Payer: Self-pay

## 2020-06-01 VITALS — BP 104/76 | Wt 187.0 lb

## 2020-06-01 DIAGNOSIS — Z3A39 39 weeks gestation of pregnancy: Secondary | ICD-10-CM

## 2020-06-01 DIAGNOSIS — O0990 Supervision of high risk pregnancy, unspecified, unspecified trimester: Secondary | ICD-10-CM

## 2020-06-01 DIAGNOSIS — O34219 Maternal care for unspecified type scar from previous cesarean delivery: Secondary | ICD-10-CM

## 2020-06-01 DIAGNOSIS — O99013 Anemia complicating pregnancy, third trimester: Secondary | ICD-10-CM

## 2020-06-01 DIAGNOSIS — O09293 Supervision of pregnancy with other poor reproductive or obstetric history, third trimester: Secondary | ICD-10-CM

## 2020-06-01 DIAGNOSIS — O9081 Anemia of the puerperium: Secondary | ICD-10-CM | POA: Diagnosis not present

## 2020-06-01 DIAGNOSIS — O324XX0 Maternal care for high head at term, not applicable or unspecified: Secondary | ICD-10-CM | POA: Diagnosis present

## 2020-06-01 DIAGNOSIS — O34211 Maternal care for low transverse scar from previous cesarean delivery: Secondary | ICD-10-CM | POA: Diagnosis present

## 2020-06-01 DIAGNOSIS — O09299 Supervision of pregnancy with other poor reproductive or obstetric history, unspecified trimester: Secondary | ICD-10-CM

## 2020-06-01 DIAGNOSIS — O0993 Supervision of high risk pregnancy, unspecified, third trimester: Secondary | ICD-10-CM

## 2020-06-01 DIAGNOSIS — O26893 Other specified pregnancy related conditions, third trimester: Secondary | ICD-10-CM | POA: Diagnosis present

## 2020-06-01 DIAGNOSIS — Z20822 Contact with and (suspected) exposure to covid-19: Secondary | ICD-10-CM | POA: Diagnosis present

## 2020-06-01 DIAGNOSIS — D62 Acute posthemorrhagic anemia: Secondary | ICD-10-CM | POA: Diagnosis not present

## 2020-06-01 LAB — TYPE AND SCREEN
ABO/RH(D): O POS
Antibody Screen: NEGATIVE

## 2020-06-01 LAB — CBC
HCT: 33.9 % — ABNORMAL LOW (ref 36.0–46.0)
Hemoglobin: 11.9 g/dL — ABNORMAL LOW (ref 12.0–15.0)
MCH: 32.2 pg (ref 26.0–34.0)
MCHC: 35.1 g/dL (ref 30.0–36.0)
MCV: 91.9 fL (ref 80.0–100.0)
Platelets: 197 10*3/uL (ref 150–400)
RBC: 3.69 MIL/uL — ABNORMAL LOW (ref 3.87–5.11)
RDW: 13.7 % (ref 11.5–15.5)
WBC: 15.4 10*3/uL — ABNORMAL HIGH (ref 4.0–10.5)
nRBC: 0 % (ref 0.0–0.2)

## 2020-06-01 LAB — SARS CORONAVIRUS 2 BY RT PCR (HOSPITAL ORDER, PERFORMED IN ~~LOC~~ HOSPITAL LAB): SARS Coronavirus 2: NEGATIVE

## 2020-06-01 LAB — RUPTURE OF MEMBRANE (ROM)PLUS: Rom Plus: POSITIVE

## 2020-06-01 MED ORDER — LIDOCAINE HCL (PF) 1 % IJ SOLN: 30.0000 mL | INTRAMUSCULAR | Status: DC | PRN

## 2020-06-01 MED ORDER — PHENYLEPHRINE 40 MCG/ML (10ML) SYRINGE FOR IV PUSH (FOR BLOOD PRESSURE SUPPORT): 80.0000 ug | PREFILLED_SYRINGE | INTRAVENOUS | Status: DC | PRN

## 2020-06-01 MED ORDER — LACTATED RINGERS IV SOLN: 500.0000 mL | INTRAVENOUS | Status: DC | PRN

## 2020-06-01 MED ORDER — EPHEDRINE 5 MG/ML INJ: 10.0000 mg | INTRAVENOUS | Status: DC | PRN

## 2020-06-01 MED ORDER — OXYTOCIN 10 UNIT/ML IJ SOLN
INTRAMUSCULAR | Status: AC
  Filled 2020-06-01: qty 2

## 2020-06-01 MED ORDER — FENTANYL 2.5 MCG/ML W/ROPIVACAINE 0.15% IN NS 100 ML EPIDURAL (ARMC)
EPIDURAL | Status: DC | PRN
  Administered 2020-06-01: 12 mL/h via EPIDURAL

## 2020-06-01 MED ORDER — PENICILLIN G POT IN DEXTROSE 60000 UNIT/ML IV SOLN
3.0000 10*6.[IU] | INTRAVENOUS | Status: DC
  Administered 2020-06-02: 3 10*6.[IU] via INTRAVENOUS
  Filled 2020-06-01: qty 50

## 2020-06-01 MED ORDER — LACTATED RINGERS IV SOLN: INTRAVENOUS | Status: DC

## 2020-06-01 MED ORDER — BUTORPHANOL TARTRATE 1 MG/ML IJ SOLN
1.0000 mg | INTRAMUSCULAR | Status: DC | PRN
  Administered 2020-06-01: 1 mg via INTRAVENOUS
  Filled 2020-06-01: qty 1

## 2020-06-01 MED ORDER — ONDANSETRON HCL 4 MG/2ML IJ SOLN: 4.0000 mg | Freq: Four times a day (QID) | INTRAMUSCULAR | Status: DC | PRN

## 2020-06-01 MED ORDER — ACETAMINOPHEN 325 MG PO TABS: 650.0000 mg | ORAL_TABLET | ORAL | Status: DC | PRN

## 2020-06-01 MED ORDER — FENTANYL 2.5 MCG/ML W/ROPIVACAINE 0.15% IN NS 100 ML EPIDURAL (ARMC)
EPIDURAL | Status: AC
  Administered 2020-06-02: 12 mL/h via EPIDURAL
  Filled 2020-06-01: qty 100

## 2020-06-01 MED ORDER — LACTATED RINGERS IV SOLN: 500.0000 mL | Freq: Once | INTRAVENOUS | Status: DC

## 2020-06-01 MED ORDER — ACETAMINOPHEN 325 MG PO TABS
650.0000 mg | ORAL_TABLET | ORAL | Status: DC | PRN
  Administered 2020-06-02: 650 mg via ORAL
  Filled 2020-06-01: qty 2

## 2020-06-01 MED ORDER — OXYTOCIN BOLUS FROM INFUSION: 333.0000 mL | Freq: Once | INTRAVENOUS | Status: DC

## 2020-06-01 MED ORDER — AMMONIA AROMATIC IN INHA
RESPIRATORY_TRACT | Status: AC
  Filled 2020-06-01: qty 10

## 2020-06-01 MED ORDER — LIDOCAINE HCL (PF) 1 % IJ SOLN
INTRAMUSCULAR | Status: AC
  Filled 2020-06-01: qty 30

## 2020-06-01 MED ORDER — SOD CITRATE-CITRIC ACID 500-334 MG/5ML PO SOLN: 30.0000 mL | ORAL | Status: DC | PRN

## 2020-06-01 MED ORDER — MISOPROSTOL 200 MCG PO TABS
ORAL_TABLET | ORAL | Status: AC
  Filled 2020-06-01: qty 4

## 2020-06-01 MED ORDER — OXYTOCIN-SODIUM CHLORIDE 30-0.9 UT/500ML-% IV SOLN
2.5000 [IU]/h | INTRAVENOUS | Status: DC
  Administered 2020-06-02 (×2): 30 [IU] via INTRAVENOUS
  Filled 2020-06-01: qty 500
  Filled 2020-06-01: qty 1000

## 2020-06-01 MED ORDER — SODIUM CHLORIDE 0.9 % IV SOLN
5.0000 10*6.[IU] | Freq: Once | INTRAVENOUS | Status: AC
  Administered 2020-06-02: 5 10*6.[IU] via INTRAVENOUS
  Filled 2020-06-01: qty 5

## 2020-06-01 MED ORDER — SODIUM CHLORIDE 0.9 % IV SOLN
INTRAVENOUS | Status: DC | PRN
  Administered 2020-06-01 (×2): 5 mL via EPIDURAL
  Administered 2020-06-01: 2 mL via EPIDURAL

## 2020-06-01 MED ORDER — LIDOCAINE-EPINEPHRINE (PF) 1.5 %-1:200000 IJ SOLN
INTRAMUSCULAR | Status: DC | PRN
  Administered 2020-06-01: 3 mL via PERINEURAL

## 2020-06-01 MED ORDER — FENTANYL 2.5 MCG/ML W/ROPIVACAINE 0.15% IN NS 100 ML EPIDURAL (ARMC)
12.0000 mL/h | EPIDURAL | Status: DC
  Filled 2020-06-01: qty 100

## 2020-06-01 MED ORDER — LIDOCAINE HCL (PF) 1 % IJ SOLN
INTRAMUSCULAR | Status: DC | PRN
  Administered 2020-06-01: 2 mL

## 2020-06-01 MED ORDER — DIPHENHYDRAMINE HCL 50 MG/ML IJ SOLN: 12.5000 mg | INTRAMUSCULAR | Status: DC | PRN

## 2020-06-01 NOTE — Progress Notes (Signed)
   Subjective:  Comfortable epidural now in place  Objective:   Vitals: Blood pressure 110/63, pulse (!) 104, temperature 98.1 F (36.7 C), temperature source Oral, resp. rate 16, height 5\' 1"  (1.549 m), weight 85.3 kg, last menstrual period 09/02/2019, SpO2 100 %, unknown if currently breastfeeding. General: NAD Abdomen: gravid, non-tender Cervical Exam:  Dilation: 4 Effacement (%): 100 Cervical Position: Middle Station: 0 Presentation: Vertex Exam by:: Cyriah Childrey MD  FHT: 130, moderate, +accels, a few subtle late decelerations have been noted Toco: q2-70min  Results for orders placed or performed during the hospital encounter of 06/01/20 (from the past 24 hour(s))  Type and screen [place]     Status: None   Collection Time: 06/01/20  5:18 PM  Result Value Ref Range   ABO/RH(D) O POS    Antibody Screen NEG    Sample Expiration      06/04/2020,2359 Performed at [facility], [address] [place], [place] [address]   ROM Plus [facility] only)     Status: None   Collection Time: 06/01/20  9:47 PM  Result Value Ref Range   Rom Plus POSITIVE   SARS Coronavirus 2 by RT PCR (hospital order, performed in [place] hospital lab) Nasopharyngeal Nasopharyngeal Swab     Status: None   Collection Time: 06/01/20  9:47 PM   Specimen: Nasopharyngeal Swab  Result Value Ref Range   SARS Coronavirus 2 NEGATIVE NEGATIVE  CBC on admission     Status: Abnormal   Collection Time: 06/01/20 10:09 PM  Result Value Ref Range   WBC 15.4 (H) 4.0 - 10.5 K/uL   RBC 3.69 (L) 3.87 - 5.11 MIL/uL   Hemoglobin 11.9 (L) 12.0 - 15.0 g/dL   HCT 33.9 (L) 36 - 46 %   MCV 91.9 80.0 - 100.0 fL   MCH 32.2 26.0 - 34.0 pg   MCHC 35.1 30.0 - 36.0 g/dL   RDW 13.7 11.5 - 15.5 %   Platelets 197 150 - 400 K/uL   nRBC 0.0 0.0 - 0.2 %    Assessment:   29 y.o. G2P1001 [redacted]w[redacted]d TOLAC  Plan:   1) Labor - continue expectant management, comfortable with epidural in place  2) Fetus -  category II tracing  [name], MD, [place], [facility] 06/01/2020, 11:47 PM

## 2020-06-01 NOTE — OB Triage Note (Signed)
Pt is a 29 yo G2P1 at 39w today that presents from ED with c/o ctx that started this afternoon and have gotten increasingly worse throughout the day. Pt is a previous C/S due to Fetal Intolerance and desires a TOLAC. Pt denies VB, LOF and states positive FM. Pt rating ctx pain 8/10 and breathing through them. EFM applied and initial FHT 145. Provider notified of pt arrival to unit.

## 2020-06-01 NOTE — Progress Notes (Signed)
    Routine Prenatal Care Visit  Subjective  Heather Conner is a 29 y.o. G2P1001 at [redacted]w[redacted]d being seen today for ongoing prenatal care.  She is currently monitored for the following issues for this low-risk pregnancy and has Annual physical exam; Anemia; Mastitis during puerperium; Endometriosis; Eczema; Ovarian cyst; Hematuria; Hidradenitis; Breast mass, right; Prediabetes; Benign cyst of right breast; Supervision of high risk pregnancy, antepartum; History of cesarean section complicating pregnancy; Anemia affecting pregnancy in third trimester; and History of macrosomia in infant in prior pregnancy, currently pregnant on their problem list.  ----------------------------------------------------------------------------------- Patient reports no complaints.   Contractions: Regular. Vag. Bleeding: None.  Movement: Present. Denies leaking of fluid.  ----------------------------------------------------------------------------------- The following portions of the patient's history were reviewed and updated as appropriate: allergies, current medications, past family history, past medical history, past social history, past surgical history and problem list. Problem list updated.   Objective  Blood pressure 104/76, weight 187 lb (84.8 kg), last menstrual period 09/02/2019, unknown if currently breastfeeding. Pregravid weight 174 lb (78.9 kg) Total Weight Gain 13 lb (5.897 kg) Urinalysis:      Fetal Status: Fetal Heart Rate (bpm): 145 Fundal Height: 38 cm Movement: Present  Presentation: Vertex  General:  Alert, oriented and cooperative. Patient is in no acute distress.  Skin: Skin is warm and dry. No rash noted.   Cardiovascular: Normal heart rate noted  Respiratory: Normal respiratory effort, no problems with respiration noted  Abdomen: Soft, gravid, appropriate for gestational age. Pain/Pressure: Present     Pelvic:  Cervical exam performed Dilation: 3 Effacement (%): 70 Station: -2  Extremities:  Normal range of motion.     ental Status: Normal mood and affect. Normal behavior. Normal judgment and thought content.     Assessment   29 y.o. G2P1001 at [redacted]w[redacted]d by  06/08/2020, by Last Menstrual Period presenting for routine prenatal visit  Plan   pregnancy2 Problems (from 09/02/19 to present)    Problem Noted Resolved   Anemia affecting pregnancy in third trimester 04/05/2020 by [name], MD No   Supervision of high risk pregnancy, antepartum 11/01/2019 by Gae Dry, MD No   Overview Addendum 04/05/2020  2:57 PM by [name], MD    Clinic [facility] Prenatal Labs  Dating LMP = 8 week [place] Blood type: O/Positive/-- (01/05 1625)   Genetic Screen Declines Antibody:Negative (01/05 1625)  Anatomic [place] complete Rubella: 4.46 (01/05 1625) Varicella: Immune  GTT 117 RPR: Non Reactive (01/05 1625)   Rhogam N/A HBsAg: Negative (01/05 1625)   TDaP vaccine 04/05/20   Flu Shot: HIV: Non Reactive (01/05 1625)   Baby Food Breast/bottle Hx of severe mastitis, leaning towards bottle GBS:   Strep pos in urine, tx labor  Contraception  Pap: 11/01/2019 NIL   CBB  No   CS/VBAC Considering TOLAC if EFW lower   Support Person Husband Alex           Previous Version   History of cesarean section complicating pregnancy 6/0/4540 by Gae Dry, MD No   Overview Signed 11/04/2019 11:48 PM by [name], MD    9lbs 11oz FTP          Gestational age appropriate obstetric precautions including but not limited to vaginal bleeding, contractions, leaking of fluid and fetal movement were reviewed in detail with the patient.    Return in about 1 week (around 06/08/2020) for [place].  [name], MD, [facility], [facility] 06/01/2020, 8:59 AM

## 2020-06-01 NOTE — H&P (Signed)
Obstetric H&P   Chief Complaint: Contractions  Prenatal Care Provider: [facility]  History of Present Illness: 29 y.o. G2P1001 [redacted]w[redacted]d by 06/08/2020, by Last Menstrual Period presenting to L&D with contraction.  Patient was 3cm in clinic was 3.5 on presentation making change to 4cm with SROM during triage evaluation.  +FM, no VB.  Pregnancy uncomplicated to date  Pregravid weight 78.9 kg Total Weight Gain 6.35 kg  pregnancy2 Problems (from 09/02/19 to present)    Problem Noted Resolved   Anemia affecting pregnancy in third trimester 04/05/2020 by [name], MD No   Supervision of high risk pregnancy, antepartum 11/01/2019 by Gae Dry, MD No   Overview Addendum 04/05/2020  2:57 PM by [name], MD    [facility]inic [facility] Prenatal Labs  Dating LMP = 8 week [place] Blood type: O/Positive/-- (01/05 1625)   Genetic Screen Declines Antibody:Negative (01/05 1625)  Anatomic [place] complete Rubella: 4.46 (01/05 1625) Varicella: Immune  GTT 117 RPR: Non Reactive (01/05 1625)   Rhogam N/A HBsAg: Negative (01/05 1625)   TDaP vaccine 04/05/20   Flu Shot: HIV: Non Reactive (01/05 1625)   Baby Food Breast/bottle Hx of severe mastitis, leaning towards bottle GBS:   Strep pos in urine, tx labor  Contraception  Pap: 11/01/2019 NIL   CBB  No   CS/VBAC Considering TOLAC if EFW lower   Support Person Husband Alex           Previous Version   History of cesarean section complicating pregnancy 0/0/8676 by Gae Dry, MD No   Overview Signed 11/04/2019 11:48 PM by [name], MD    9lbs 11oz FTP          Review of Systems: 10 point review of systems negative unless otherwise noted in HPI  Past Medical History: Patient Active Problem List   Diagnosis Date Noted  . Normal labor 06/01/2020  . History of macrosomia in infant in prior pregnancy, currently pregnant 04/18/2020  . Anemia affecting pregnancy in third trimester 04/05/2020  . Supervision of high risk pregnancy, antepartum  11/01/2019    Clinic [facility] Prenatal Labs  Dating LMP = 8 week [place] Blood type: O/Positive/-- (01/05 1625)   Genetic Screen Declines Antibody:Negative (01/05 1625)  Anatomic [place] complete Rubella: 4.46 (01/05 1625) Varicella: Immune  GTT 117 RPR: Non Reactive (01/05 1625)   Rhogam N/A HBsAg: Negative (01/05 1625)   TDaP vaccine 04/05/20   Flu Shot: HIV: Non Reactive (01/05 1625)   Baby Food Breast/bottle Hx of severe mastitis, leaning towards bottle GBS:   Strep pos in urine, tx labor  Contraception  Pap: 11/01/2019 NIL   CBB  No   CS/VBAC Considering TOLAC if EFW lower   Support Person Husband Alex        . History of cesarean section complicating pregnancy 19/50/9326    9lbs 11oz FTP   . Benign cyst of right breast 07/27/2018  . Hidradenitis 03/05/2018  . Breast mass, right 03/05/2018  . Prediabetes 03/05/2018  . Endometriosis 02/25/2018  . Eczema 02/25/2018  . Ovarian cyst 02/25/2018    Right s/p removal ovarian cyst removal ovary intact    . Hematuria 02/25/2018  . Mastitis during puerperium 04/24/2017  . Anemia 04/13/2017  . Annual physical exam 05/27/2013    Past Surgical History: Past Surgical History:  Procedure Laterality Date  . ABLATION ON ENDOMETRIOSIS  2012  . CESAREAN SECTION N/A 04/12/2017   Procedure: CESAREAN SECTION;  Surgeon: Gae Dry, MD;  Location: [facility] ORS;  Service: Obstetrics;  Laterality: N/A;  . LAPAROSCOPIC OVARIAN CYSTECTOMY Right 2012    Past Obstetric History: # 1 - Date: 04/12/17, Sex: Female, Weight: 4410 g, GA: [redacted]w[redacted]d, Delivery: C-Section, Low Vertical, Apgar1: 8, Apgar5: 9, Living: Living, Birth Comments: None  # 2 - Date: None, Sex: None, Weight: None, GA: None, Delivery: None, Apgar1: None, Apgar5: None, Living: None, Birth Comments: None   Past Gynecologic History:  Family History: Family History  Problem Relation Age of Onset  . Hypertension Mother   . Birth defects Mother        Congential heart condition   .  Endometriosis Mother   . Diabetes Mother   . Other Mother        endometrosis  . Cancer Father        Prostate  . Diabetes Paternal Grandfather   . Stroke Paternal Grandfather     Social History: Social History   Socioeconomic History  . Marital status: Married    Spouse name: Heather Conner - Fiance  . Number of children: Not on file  . Years of education: 52  . Highest education level: Not on file  Occupational History  . 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 Smoking status: Never Smoker  . Smokeless tobacco: Never Used  Vaping Use  . Vaping Use: Never used  Substance and Sexual Activity  . Alcohol use: No    Alcohol/week: 0.0 standard drinks  . Drug use: No  . Sexual activity: Not Currently    Partners: Male    Birth control/protection: None  Other Topics Concern  . Not on file  Social History Narrative   Heather Conner was born and reared in [place], [place]. She graduated from [employer] in 2008 and then went to [employer] and obtained her Bachelors in [employer] in 2012. Married with 1 son had in 2018.  She has a turtle named Crush. 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 She enjoys photography, hanging out with friends. She enjoys action movies. 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      Caffeine- Coffee 1 cup, 1 small can of soda   Social Determinants of Health   Financial Resource Strain:   . Difficulty of Paying Living Expenses:   Food Insecurity:   . Worried About Charity fundraiser in the Last Year:   . Arboriculturist in the Last Year:   Transportation Needs:   . Film/video editor (Medical):   Marland Kitchen Lack of Transportation (Non-Medical):   Physical Activity:   . Days of Exercise per Week:   . Minutes of Exercise per Session:   Stress:   . Feeling of Stress :   Social Connections:   .  Frequency of Communication with Friends and Family:   . Frequency of Social Gatherings with Friends and Family:   . Attends Religious Services:   . Active Member of Clubs or Organizations:   . Attends Archivist Meetings:   Marland Kitchen Marital Status:   Intimate Partner Violence:   . Fear of Current or Ex-Partner:   . Emotionally Abused:   Marland Kitchen Physically Abused:   . Sexually Abused:     Medications: Prior to Admission medications   Medication Sig Start Date End Date Taking? Authorizing Provider  ferrous sulfate (FERROUSUL) 325 (65 FE) MG tablet Take 1 tablet (  325 mg total) by mouth daily with breakfast. 03/28/20  Yes [name], MD  omeprazole (PRILOSEC) 20 MG capsule Take 1 capsule (20 mg total) by mouth daily. 04/25/20  Yes [name], MD  Prenatal Vit-Fe Fumarate-FA (MULTIVITAMIN-PRENATAL) 27-0.8 MG TABS tablet Take 1 tablet by mouth daily at 12 noon.   Yes [provider]    Allergies: No Known Allergies  Physical Exam: Vitals: Blood pressure 117/80, pulse 94, temperature 98.2 F (36.8 C), temperature source Oral, resp. rate 16, height 5\' 1"  (1.549 m), weight 85.3 kg, last menstrual period 09/02/2019, unknown if currently breastfeeding.  FHT: 135, moderate, +accles, no decels Toco: q27min  General: NAD HEENT: normocephalic, anicteric Pulmonary: No increased work of breathing Cardiovascular: RRR, distal pulses 2+ Abdomen: Gravid, non-tender [place]: vtx Genitourinary: Dilation: 4 Effacement (%): 80 Cervical Position: Middle Station: -2 Presentation: Vertex Exam by:: Robertta Halfhill MD  Extremities: no edema, erythema, or tenderness Neurologic: Grossly intact Psychiatric: mood appropriate, affect full  Labs: No results found for this or any previous visit (from the past 24 hour(s)).  Assessment: 29 y.o. G2P1001 [redacted]w[redacted]d by 06/08/2020, by Last Menstrual Period presenting for term labor in setting of prior cesarean section  Plan: 1) TOLAC - 29 y.o. G2P1001  at [redacted]w[redacted]d with Estimated Date of Delivery: 06/08/20 was seen today in office to discuss trial of labor after cesarean section (TOLAC) versus elective repeat cesarean delivery (ERCD). The following risks were discussed with the patient.  Risk of uterine rupture at term is 0.78 percent with TOLAC and 0.22 percent with ERCD. 1 in 10 uterine ruptures will result in neonatal death or neurological injury. The benefits of a trial of labor after cesarean (TOLAC) resulting in a vaginal birth after cesarean (VBAC) include the following: shorter length of hospital stay and postpartum recovery (in most cases); fewer complications, such as postpartum fever, wound or uterine infection, thromboembolism (blood clots in the leg or lung), need for blood transfusion and fewer neonatal breathing problems. The risks of an attempted VBAC or TOLAC include the following: Risk of failed trial of labor after cesarean (TOLAC) without a vaginal birth after cesarean (VBAC) resulting in repeat cesarean delivery (RCD) in about 20 to 64 percent of women who attempt VBAC.   Risk of rupture of uterus resulting in an emergency cesarean delivery. The risk of uterine rupture may be related in part to the type of uterine incision made during the first cesarean delivery. A previous transverse uterine incision has the lowest risk of rupture (0.2 to 1.5 percent risk). Vertical or T-shaped uterine incisions have a higher risk of uterine rupture (4 to 9 percent risk)The risk of fetal death is very low with both VBAC and elective repeat cesarean delivery (ERCD), but the likelihood of fetal death is higher with VBAC than with ERCD. Maternal death is very rare with either type of delivery. The risks of an elective repeat cesarean delivery (ERCD) were reviewed with the patient including but not limited to: 11/998 risk of uterine rupture which could have serious consequences, bleeding which may require transfusion; infection which may require antibiotics;  injury to bowel, bladder or other surrounding organs (bowel, bladder, ureters); injury to the fetus; need for additional procedures including hysterectomy in the event of a life-threatening hemorrhage; thromboembolic phenomenon; abnormal placentation; incisional problems; death and other postoperative or anesthesia complications.    In addition we discussed that our collective office practice is to allow patient's who desire to attempt TOLAC to go into labor naturally.  There is some limited  data that rupture rate may increase past [redacted] weeks gestation, but it is reasonable for women who are strongly committed to [facility] to continue pregnancy into the 41st week.  Medical indications necessetating early delivery may arise during the course of any pregnancy.  Given the contraindication on the use of prostaglandins for use in cervical ripening,  recommendation would be to proceed with repeat cesarean for delivery for patient's with unfavorable cervix (low Bishops score) who reach 41 weeks or who otherwise have a medical indication for early delivery.   These risks and benefits are summarized on the consent form, which was reviewed with the patient during the visit.  All her questions answered and she signed a consent indicating a preference for TOLAC/ERCD. A copy of the consent was given to the patient.   [name], MD, [facility]   2) Fetus - cat I tracing  3) PNL - Blood type O/Positive/-- (01/05 1625) / Anti-bodyscreen Negative (01/05 1625) / Rubella 4.46 (01/05 1625) / Varicella Immune / RPR Non Reactive (05/28 1546) / HBsAg Negative (01/05 1625) / HIV Non Reactive (05/28 1546) / 1-hr OGTT 117 / GBS Negative/-- (07/21 1250) but prior GBS bacteruria will treat in labor  4) Immunization History -  Immunization History  Administered Date(s) Administered  . Influenza,inj,Quad PF,6+ Mos 11/01/2019  . Influenza-Unspecified 12/21/2018  . Tdap 01/27/2017,  04/05/2020   5) Disposition - pending delivery, initiate VBAC protocol  [name], MD, [place], [facility] 06/01/2020, 10:01 PM

## 2020-06-01 NOTE — Progress Notes (Signed)
ROB CTX

## 2020-06-01 NOTE — Anesthesia Procedure Notes (Signed)
Epidural Patient location during procedure: OB  Staffing Anesthesiologist: Tera Mater, MD Performed: anesthesiologist   Preanesthetic Checklist Completed: patient identified, IV checked, site marked, risks and benefits discussed, surgical consent, monitors and equipment checked, pre-op evaluation and timeout performed  Epidural Patient position: sitting Prep: ChloraPrep Patient monitoring: heart rate, continuous pulse ox and blood pressure Approach: midline Location: L4-L5 Injection technique: LOR saline  Needle:  Needle type: Tuohy  Needle gauge: 18 G Needle length: 9 cm and 9 Needle insertion depth: 6 cm Catheter type: closed end flexible Catheter size: 20 Guage Catheter at skin depth: 11 cm Test dose: negative and 1.5% lidocaine with Epi 1:200 K  Assessment Events: blood not aspirated, injection not painful, no injection resistance, no paresthesia and negative IV test  Additional Notes Risks and benefits of procedure discussed with patient.  Risks including but not limited to infection, spinal/epidural hematoma, nerve injury, post dural puncture headache, and inadequate/failed block.  Patient expressed understanding and consented to epidural placement. Negative dural puncture.  Negative aspiration.  Negative paresthesia on injection.  Dose given in divided aliquots.  Patient tolerated the procedure well with no immediate complications.   Reason for block:procedure for pain

## 2020-06-01 NOTE — Anesthesia Preprocedure Evaluation (Addendum)
Anesthesia Evaluation  Patient identified by MRN, date of birth, ID band Patient awake    Reviewed: Allergy & Precautions, H&P , NPO status , Patient's Chart, lab work & pertinent test results  Airway Mallampati: II  TM Distance: >3 FB     Dental  (+) Teeth Intact   Pulmonary neg pulmonary ROS,           Cardiovascular Exercise Tolerance: Good (-) hypertensionnegative cardio ROS       Neuro/Psych  Headaches,    GI/Hepatic negative GI ROS,   Endo/Other    Renal/GU   negative genitourinary   Musculoskeletal   Abdominal   Peds  Hematology  (+) Blood dyscrasia, anemia ,   Anesthesia Other Findings TOLAC  Past Medical History: No date: Anemia No date: Eczema No date: Eczema No date: Endometriosis No date: History of ovarian cyst No date: Melanoma ([place])     Comment:  invasive right upper back  No date: Migraine     Comment:  On continuous BC for prevention No date: Prediabetes  Past Surgical History: 2012: ABLATION ON ENDOMETRIOSIS 04/12/2017: CESAREAN SECTION; N/A     Comment:  Procedure: CESAREAN SECTION;  Surgeon: Gae Dry,              MD;  Location: [facility] ORS;  Service: Obstetrics;                Laterality: N/A; 2012: LAPAROSCOPIC OVARIAN CYSTECTOMY; Right  BMI    Body Mass Index: 35.52 kg/m      Reproductive/Obstetrics (+) Pregnancy                            Anesthesia Physical Anesthesia Plan  ASA: II  Anesthesia Plan: Epidural   Post-op Pain Management:    Induction:   PONV Risk Score and Plan:   Airway Management Planned: Natural Airway and Nasal Cannula  Additional Equipment:   Intra-op Plan:   Post-operative Plan:   Informed Consent: I have reviewed the patients History and Physical, chart, labs and discussed the procedure including the risks, benefits and alternatives for the proposed anesthesia with the patient or authorized representative  who has indicated his/her understanding and acceptance.     Dental Advisory Given  Plan Discussed with: Anesthesiologist  Anesthesia Plan Comments: (Patient reports no bleeding problems and no anticoagulant use.  Plan to use epidural with backup GA  Patient consented for risks of anesthesia including but not limited to:  - adverse reactions to medications - damage to eyes, teeth, lips or other oral mucosa - nerve damage due to positioning  - risk of bleeding, infection and or nerve damage from spinal that could lead to paralysis - risk of headache or failed spinal/epidural - damage to teeth, lips or other oral mucosa - sore throat or hoarseness - damage to heart, brain, nerves, lungs, other parts of body or loss of life  Patient voiced understanding.)       Anesthesia Quick Evaluation

## 2020-06-02 ENCOUNTER — Encounter: Payer: Self-pay | Admitting: Obstetrics and Gynecology

## 2020-06-02 ENCOUNTER — Encounter
Admission: EM | Disposition: A | Discharge status: Home / Self Care | Payer: Self-pay | Source: Home / Self Care | Attending: Obstetrics and Gynecology

## 2020-06-02 DIAGNOSIS — Z3A39 39 weeks gestation of pregnancy: Secondary | ICD-10-CM

## 2020-06-02 LAB — RPR: RPR Ser Ql: NONREACTIVE

## 2020-06-02 SURGERY — Surgical Case
Anesthesia: Epidural

## 2020-06-02 MED ORDER — NALBUPHINE HCL 10 MG/ML IJ SOLN: 5.0000 mg | Freq: Once | INTRAMUSCULAR | Status: DC | PRN

## 2020-06-02 MED ORDER — NALOXONE HCL 4 MG/10ML IJ SOLN
1.0000 ug/kg/h | INTRAVENOUS | Status: DC | PRN
  Filled 2020-06-02: qty 5

## 2020-06-02 MED ORDER — CEFAZOLIN SODIUM-DEXTROSE 2-3 GM-%(50ML) IV SOLR
INTRAVENOUS | Status: DC | PRN
Start: 2020-06-02 — End: 2020-06-02
  Administered 2020-06-02: 2 g via INTRAVENOUS

## 2020-06-02 MED ORDER — SIMETHICONE 80 MG PO CHEW
80.0000 mg | CHEWABLE_TABLET | ORAL | Status: DC
  Administered 2020-06-02 – 2020-06-04 (×2): 80 mg via ORAL
  Filled 2020-06-02 (×2): qty 1

## 2020-06-02 MED ORDER — TERBUTALINE SULFATE 1 MG/ML IJ SOLN: 0.2500 mg | Freq: Once | INTRAMUSCULAR | Status: DC | PRN

## 2020-06-02 MED ORDER — CEFAZOLIN SODIUM-DEXTROSE 2-4 GM/100ML-% IV SOLN
2.0000 g | INTRAVENOUS | Status: DC
  Filled 2020-06-02: qty 100

## 2020-06-02 MED ORDER — MEPERIDINE HCL 25 MG/ML IJ SOLN: 6.2500 mg | INTRAMUSCULAR | Status: DC | PRN

## 2020-06-02 MED ORDER — FENTANYL CITRATE (PF) 100 MCG/2ML IJ SOLN
INTRAMUSCULAR | Status: AC
  Filled 2020-06-02: qty 2

## 2020-06-02 MED ORDER — OXYTOCIN-SODIUM CHLORIDE 30-0.9 UT/500ML-% IV SOLN
1.0000 m[IU]/min | INTRAVENOUS | Status: DC
  Administered 2020-06-02: 2 m[IU]/min via INTRAVENOUS

## 2020-06-02 MED ORDER — OXYCODONE HCL 5 MG PO TABS
5.0000 mg | ORAL_TABLET | ORAL | Status: AC | PRN
  Administered 2020-06-03 (×2): 5 mg via ORAL
  Filled 2020-06-02 (×3): qty 1

## 2020-06-02 MED ORDER — DIPHENHYDRAMINE HCL 50 MG/ML IJ SOLN: 12.5000 mg | INTRAMUSCULAR | Status: DC | PRN

## 2020-06-02 MED ORDER — SODIUM CHLORIDE 0.9 % IV SOLN: 500.0000 mg | Freq: Once | INTRAVENOUS | Status: DC

## 2020-06-02 MED ORDER — PRENATAL MULTIVITAMIN CH
1.0000 | ORAL_TABLET | Freq: Every day | ORAL | Status: DC
  Administered 2020-06-03 – 2020-06-04 (×2): 1 via ORAL
  Filled 2020-06-02 (×2): qty 1

## 2020-06-02 MED ORDER — SODIUM CHLORIDE 0.9 % IV SOLN
INTRAVENOUS | Status: AC
  Filled 2020-06-02: qty 5

## 2020-06-02 MED ORDER — COCONUT OIL OIL: 1.0000 "application " | TOPICAL_OIL | Status: DC | PRN

## 2020-06-02 MED ORDER — SODIUM CHLORIDE 0.9 % IV SOLN
INTRAVENOUS | Status: DC | PRN
  Administered 2020-06-02: 10 ug/min via INTRAVENOUS

## 2020-06-02 MED ORDER — ACETAMINOPHEN 325 MG PO TABS
650.0000 mg | ORAL_TABLET | Freq: Four times a day (QID) | ORAL | Status: AC
  Administered 2020-06-02 – 2020-06-03 (×3): 650 mg via ORAL
  Filled 2020-06-02 (×3): qty 2

## 2020-06-02 MED ORDER — FENTANYL CITRATE (PF) 100 MCG/2ML IJ SOLN: 25.0000 ug | INTRAMUSCULAR | Status: DC | PRN

## 2020-06-02 MED ORDER — NALBUPHINE HCL 10 MG/ML IJ SOLN: 5.0000 mg | INTRAMUSCULAR | Status: DC | PRN

## 2020-06-02 MED ORDER — ONDANSETRON HCL 4 MG/2ML IJ SOLN: 4.0000 mg | Freq: Three times a day (TID) | INTRAMUSCULAR | Status: DC | PRN

## 2020-06-02 MED ORDER — LIDOCAINE HCL (PF) 2 % IJ SOLN
INTRAMUSCULAR | Status: AC
  Filled 2020-06-02: qty 20

## 2020-06-02 MED ORDER — SODIUM CHLORIDE 0.9% FLUSH: 3.0000 mL | INTRAVENOUS | Status: DC | PRN

## 2020-06-02 MED ORDER — KETOROLAC TROMETHAMINE 30 MG/ML IJ SOLN
30.0000 mg | Freq: Four times a day (QID) | INTRAMUSCULAR | Status: AC
  Administered 2020-06-02 – 2020-06-03 (×4): 30 mg via INTRAVENOUS
  Filled 2020-06-02 (×4): qty 1

## 2020-06-02 MED ORDER — OXYTOCIN-SODIUM CHLORIDE 30-0.9 UT/500ML-% IV SOLN
2.5000 [IU]/h | INTRAVENOUS | Status: AC
  Administered 2020-06-02: 2.5 [IU]/h via INTRAVENOUS
  Filled 2020-06-02: qty 500

## 2020-06-02 MED ORDER — MENTHOL 3 MG MT LOZG
1.0000 | LOZENGE | OROMUCOSAL | Status: DC | PRN
  Filled 2020-06-02: qty 9

## 2020-06-02 MED ORDER — MORPHINE SULFATE (PF) 0.5 MG/ML IJ SOLN
INTRAMUSCULAR | Status: AC
  Filled 2020-06-02: qty 10

## 2020-06-02 MED ORDER — DIPHENHYDRAMINE HCL 25 MG PO CAPS: 25.0000 mg | ORAL_CAPSULE | ORAL | Status: DC | PRN

## 2020-06-02 MED ORDER — SOD CITRATE-CITRIC ACID 500-334 MG/5ML PO SOLN
ORAL | Status: AC
  Filled 2020-06-02: qty 30

## 2020-06-02 MED ORDER — OXYCODONE HCL 5 MG/5ML PO SOLN
5.0000 mg | Freq: Once | ORAL | Status: DC | PRN
  Filled 2020-06-02: qty 5

## 2020-06-02 MED ORDER — BUPIVACAINE HCL (PF) 0.5 % IJ SOLN
INTRAMUSCULAR | Status: AC
  Filled 2020-06-02: qty 30

## 2020-06-02 MED ORDER — ONDANSETRON HCL 4 MG/2ML IJ SOLN
INTRAMUSCULAR | Status: DC | PRN
  Administered 2020-06-02: 4 mg via INTRAVENOUS

## 2020-06-02 MED ORDER — OXYCODONE-ACETAMINOPHEN 5-325 MG PO TABS
1.0000 | ORAL_TABLET | ORAL | Status: DC | PRN
  Administered 2020-06-03 – 2020-06-04 (×4): 2 via ORAL
  Filled 2020-06-02 (×4): qty 2

## 2020-06-02 MED ORDER — IBUPROFEN 800 MG PO TABS
800.0000 mg | ORAL_TABLET | Freq: Three times a day (TID) | ORAL | Status: DC
  Administered 2020-06-03 – 2020-06-04 (×4): 800 mg via ORAL
  Filled 2020-06-02 (×4): qty 1

## 2020-06-02 MED ORDER — SIMETHICONE 80 MG PO CHEW
80.0000 mg | CHEWABLE_TABLET | ORAL | Status: DC | PRN
  Filled 2020-06-02: qty 1

## 2020-06-02 MED ORDER — FENTANYL CITRATE (PF) 100 MCG/2ML IJ SOLN
INTRAMUSCULAR | Status: DC | PRN
  Administered 2020-06-02 (×2): 100 ug via EPIDURAL
  Administered 2020-06-02: 50 ug via EPIDURAL

## 2020-06-02 MED ORDER — BUPIVACAINE HCL (PF) 0.5 % IJ SOLN: 20.0000 mL | INTRAMUSCULAR | Status: DC

## 2020-06-02 MED ORDER — LACTATED RINGERS IV SOLN: INTRAVENOUS | Status: DC

## 2020-06-02 MED ORDER — SENNOSIDES-DOCUSATE SODIUM 8.6-50 MG PO TABS
2.0000 | ORAL_TABLET | ORAL | Status: DC
  Administered 2020-06-02 – 2020-06-04 (×2): 2 via ORAL
  Filled 2020-06-02 (×2): qty 2

## 2020-06-02 MED ORDER — PROPOFOL 10 MG/ML IV BOLUS
INTRAVENOUS | Status: AC
  Filled 2020-06-02: qty 20

## 2020-06-02 MED ORDER — BUPIVACAINE 0.25 % ON-Q PUMP DUAL CATH 400 ML
400.0000 mL | INJECTION | Status: DC
  Filled 2020-06-02: qty 400

## 2020-06-02 MED ORDER — WITCH HAZEL-GLYCERIN EX PADS
1.0000 "application " | MEDICATED_PAD | CUTANEOUS | Status: DC | PRN
  Administered 2020-06-02: 1 via TOPICAL
  Filled 2020-06-02 (×2): qty 100

## 2020-06-02 MED ORDER — LIDOCAINE HCL (PF) 2 % IJ SOLN
INTRAMUSCULAR | Status: DC | PRN
Start: 2020-06-02 — End: 2020-06-02
  Administered 2020-06-02 (×5): 100 mg via EPIDURAL

## 2020-06-02 MED ORDER — MORPHINE SULFATE (PF) 0.5 MG/ML IJ SOLN
INTRAMUSCULAR | Status: DC | PRN
  Administered 2020-06-02 (×2): .5 mg via EPIDURAL
  Administered 2020-06-02: 1 mg via EPIDURAL
  Administered 2020-06-02 (×6): .5 mg via EPIDURAL

## 2020-06-02 MED ORDER — SOD CITRATE-CITRIC ACID 500-334 MG/5ML PO SOLN
30.0000 mL | ORAL | Status: AC
  Administered 2020-06-02: 30 mL via ORAL

## 2020-06-02 MED ORDER — NALOXONE HCL 0.4 MG/ML IJ SOLN: 0.4000 mg | INTRAMUSCULAR | Status: DC | PRN

## 2020-06-02 MED ORDER — SODIUM CHLORIDE 0.9 % IV SOLN
500.0000 mg | INTRAVENOUS | Status: AC
  Administered 2020-06-02: 500 mg via INTRAVENOUS
  Filled 2020-06-02: qty 500

## 2020-06-02 MED ORDER — SIMETHICONE 80 MG PO CHEW
80.0000 mg | CHEWABLE_TABLET | Freq: Three times a day (TID) | ORAL | Status: DC
  Administered 2020-06-02 – 2020-06-04 (×6): 80 mg via ORAL
  Filled 2020-06-02 (×6): qty 1

## 2020-06-02 MED ORDER — DIBUCAINE (PERIANAL) 1 % EX OINT
1.0000 "application " | TOPICAL_OINTMENT | CUTANEOUS | Status: DC | PRN
  Filled 2020-06-02 (×2): qty 28

## 2020-06-02 MED ORDER — KETOROLAC TROMETHAMINE 30 MG/ML IJ SOLN: 30.0000 mg | Freq: Four times a day (QID) | INTRAMUSCULAR | Status: AC

## 2020-06-02 MED ORDER — DIPHENHYDRAMINE HCL 25 MG PO CAPS: 25.0000 mg | ORAL_CAPSULE | Freq: Four times a day (QID) | ORAL | Status: DC | PRN

## 2020-06-02 MED ORDER — OXYCODONE HCL 5 MG PO TABS: 5.0000 mg | ORAL_TABLET | Freq: Once | ORAL | Status: DC | PRN

## 2020-06-02 SURGICAL SUPPLY — 32 items
BAG COUNTER SPONGE EZ (MISCELLANEOUS) ×2 IMPLANT
CANISTER SUCT 3000ML PPV (MISCELLANEOUS) ×3 IMPLANT
CATH KIT ON-Q SILVERSOAK 5IN (CATHETERS) ×6 IMPLANT
CHLORAPREP W/TINT 26 (MISCELLANEOUS) ×6 IMPLANT
CLOSURE WOUND 1/2 X4 (GAUZE/BANDAGES/DRESSINGS) ×1
COUNTER SPONGE BAG EZ (MISCELLANEOUS) ×1
DERMABOND ADHESIVE PROPEN (GAUZE/BANDAGES/DRESSINGS) ×2
DERMABOND ADVANCED (GAUZE/BANDAGES/DRESSINGS) ×2
DERMABOND ADVANCED .7 DNX12 (GAUZE/BANDAGES/DRESSINGS) ×1 IMPLANT
DERMABOND ADVANCED .7 DNX6 (GAUZE/BANDAGES/DRESSINGS) ×1 IMPLANT
DRSG OPSITE POSTOP 4X10 (GAUZE/BANDAGES/DRESSINGS) ×3 IMPLANT
DRSG TEGADERM 4X10 (GAUZE/BANDAGES/DRESSINGS) ×3 IMPLANT
DRSG TELFA 3X8 NADH (GAUZE/BANDAGES/DRESSINGS) ×3 IMPLANT
ELECT CAUTERY BLADE 6.4 (BLADE) ×3 IMPLANT
ELECT REM PT RETURN 9FT ADLT (ELECTROSURGICAL) ×3
ELECTRODE REM PT RTRN 9FT ADLT (ELECTROSURGICAL) ×1 IMPLANT
GAUZE SPONGE 4X4 12PLY STRL (GAUZE/BANDAGES/DRESSINGS) ×3 IMPLANT
GLOVE BIO SURGEON STRL SZ7 (GLOVE) ×3 IMPLANT
GLOVE INDICATOR 7.5 STRL GRN (GLOVE) ×3 IMPLANT
GOWN STRL REUS W/ TWL LRG LVL3 (GOWN DISPOSABLE) ×3 IMPLANT
GOWN STRL REUS W/TWL LRG LVL3 (GOWN DISPOSABLE) ×6
NS IRRIG 1000ML POUR BTL (IV SOLUTION) ×3 IMPLANT
PACK C SECTION (MISCELLANEOUS) ×3 IMPLANT
PAD OB MATERNITY 4.3X12.25 (PERSONAL CARE ITEMS) ×3 IMPLANT
PAD PREP 24X41 OB/GYN DISP (PERSONAL CARE ITEMS) ×3 IMPLANT
PENCIL SMOKE ULTRAEVAC 22 CON (MISCELLANEOUS) ×3 IMPLANT
STRIP CLOSURE SKIN 1/2X4 (GAUZE/BANDAGES/DRESSINGS) ×2 IMPLANT
SUT MNCRL AB 4-0 PS2 18 (SUTURE) ×3 IMPLANT
SUT PDS AB 1 TP1 96 (SUTURE) ×6 IMPLANT
SUT VIC AB 0 CTX 36 (SUTURE) ×4
SUT VIC AB 0 CTX36XBRD ANBCTRL (SUTURE) ×2 IMPLANT
SUT VIC AB 2-0 CT1 36 (SUTURE) ×3 IMPLANT

## 2020-06-02 NOTE — Progress Notes (Addendum)
   Subjective:  Feeling pressure with contractions  Objective:   Vitals: Blood pressure 110/66, pulse 96, temperature 98.6 F (37 C), temperature source Axillary, resp. rate 16, height 5\' 1"  (1.549 m), weight 85.3 kg, last menstrual period 09/02/2019, SpO2 97 %, unknown if currently breastfeeding. General: NAD Abdomen: gravid, non-tender Cervical Exam:  Dilation: 10 Dilation Complete Date: 06/02/20 Dilation Complete Time: 0835 Effacement (%): 100 Cervical Position: Middle Station: Plus 2 Presentation: Vertex Exam by:: Tawonna Esquer   FHT: 125, moderate, no accels, no decels Toco: q2-9min  Results for orders placed or performed during the hospital encounter of 06/01/20 (from the past 24 hour(s))  Type and screen [place]     Status: None   Collection Time: 06/01/20  5:18 PM  Result Value Ref Range   ABO/RH(D) O POS    Antibody Screen NEG    Sample Expiration      06/04/2020,2359 Performed at [facility], [address] [place], [place] [address]   ROM Plus [facility] only)     Status: None   Collection Time: 06/01/20  9:47 PM  Result Value Ref Range   Rom Plus POSITIVE   SARS Coronavirus 2 by RT PCR (hospital order, performed in [place] hospital lab) Nasopharyngeal Nasopharyngeal Swab     Status: None   Collection Time: 06/01/20  9:47 PM   Specimen: Nasopharyngeal Swab  Result Value Ref Range   SARS Coronavirus 2 NEGATIVE NEGATIVE  CBC on admission     Status: Abnormal   Collection Time: 06/01/20 10:09 PM  Result Value Ref Range   WBC 15.4 (H) 4.0 - 10.5 K/uL   RBC 3.69 (L) 3.87 - 5.11 MIL/uL   Hemoglobin 11.9 (L) 12.0 - 15.0 g/dL   HCT 33.9 (L) 36 - 46 %   MCV 91.9 80.0 - 100.0 fL   MCH 32.2 26.0 - 34.0 pg   MCHC 35.1 30.0 - 36.0 g/dL   RDW 13.7 11.5 - 15.5 %   Platelets 197 150 - 400 K/uL   nRBC 0.0 0.0 - 0.2 %    Assessment:   29 y.o. G2P1001 [redacted]w[redacted]d TOLAC with failure to progress  Plan:   1) Labor - patient pushing for the  past two hours without any descent in station still above the pubic symphysis.  At this point to high to be amenable to operative vaginal delivery with vacuum or forceps.  The patient was counseled regarding risk and benefits to proceeding with Cesarean section to expedite delivery.  Risk of cesarean section were discussed including risk of bleeding and need for potential intraoperative or postoperative blood transfusion with a rate of approximately 5% quoted for all Cesarean sections, risk of injury to adjacent organs including but not limited to bowl and bladder, the need for additional surgical procedures to address such injuries, and the risk of infection.  The risk of continued attempts at vaginal delivery include but are note limited to worsening fetal or maternal status.  After consideration of options the patient is amenable to proceed with primary cesarean section for delivery.  2) Fetus - cat I tracing   [name], MD, [place], [place] 06/02/2020, 10:24 AM

## 2020-06-02 NOTE — Anesthesia Procedure Notes (Signed)
Procedure Name: MAC Performed by: Kelton Pillar, CRNA Pre-anesthesia Checklist: Patient identified, Emergency Drugs available, Suction available and Patient being monitored Patient Re-evaluated:Patient Re-evaluated prior to induction Oxygen Delivery Method: Nasal cannula Placement Confirmation: positive ETCO2 and CO2 detector

## 2020-06-02 NOTE — Transfer of Care (Signed)
Immediate Anesthesia Transfer of Care Note  Patient: Heather Conner  Procedure(s) Performed: CESAREAN SECTION (N/A )  Patient Location: PACU and Mother/Baby  Anesthesia Type:Epidural  Level of Consciousness: awake, alert , oriented and patient cooperative  Airway & Oxygen Therapy: Patient Spontanous Breathing and Patient connected to nasal cannula oxygen  Post-op Assessment: Report given to RN and Post -op Vital signs reviewed and stable  Post vital signs: Reviewed and stable  Last Vitals:  Vitals Value Taken Time  BP    Temp    Pulse    Resp    SpO2      Last Pain:  Vitals:   06/02/20 0725  TempSrc: Axillary  PainSc:       Patients Stated Pain Goal: 0 (91/47/82 9562)  Complications: No complications documented.

## 2020-06-02 NOTE — Progress Notes (Signed)
   Subjective:  Comfortable with epidural in place  Objective:   Vitals: Blood pressure (!) 92/42, pulse (!) 102, temperature 97.9 F (36.6 C), temperature source Oral, resp. rate 16, height 5\' 1"  (1.549 m), weight 85.3 kg, last menstrual period 09/02/2019, SpO2 98 %, unknown if currently breastfeeding. General:  Abdomen: Cervical Exam:  Dilation: 6.5 Effacement (%): 100 Cervical Position: Middle Station: 0 Presentation: Vertex Exam by:: Jemia Fata MD  FHT: 135, moderate, +accels, no decels Toco:q74min  Results for orders placed or performed during the hospital encounter of 06/01/20 (from the past 24 hour(s))  Type and screen [place]     Status: None   Collection Time: 06/01/20  5:18 PM  Result Value Ref Range   ABO/RH(D) O POS    Antibody Screen NEG    Sample Expiration      06/04/2020,2359 Performed at [facility], [address] [place], [place] [address]   ROM Plus [facility] only)     Status: None   Collection Time: 06/01/20  9:47 PM  Result Value Ref Range   Rom Plus POSITIVE   SARS Coronavirus 2 by RT PCR (hospital order, performed in [place] hospital lab) Nasopharyngeal Nasopharyngeal Swab     Status: None   Collection Time: 06/01/20  9:47 PM   Specimen: Nasopharyngeal Swab  Result Value Ref Range   SARS Coronavirus 2 NEGATIVE NEGATIVE  CBC on admission     Status: Abnormal   Collection Time: 06/01/20 10:09 PM  Result Value Ref Range   WBC 15.4 (H) 4.0 - 10.5 K/uL   RBC 3.69 (L) 3.87 - 5.11 MIL/uL   Hemoglobin 11.9 (L) 12.0 - 15.0 g/dL   HCT 33.9 (L) 36 - 46 %   MCV 91.9 80.0 - 100.0 fL   MCH 32.2 26.0 - 34.0 pg   MCHC 35.1 30.0 - 36.0 g/dL   RDW 13.7 11.5 - 15.5 %   Platelets 197 150 - 400 K/uL   nRBC 0.0 0.0 - 0.2 %    Assessment:   29 y.o. G2P1001 [redacted]w[redacted]d TOLAC  Plan:   1) Labor - no cervical change in past 2-hr, contractions have spaced out.  IUPC placed, anticipate need to start pitocin which was discussed with  patient and her husband  2) Fetus - cat II,  Good variability and acceleration present but still intermittent late decelerations  [name], MD, [place], [place] 06/02/2020, 4:48 AM

## 2020-06-02 NOTE — Progress Notes (Signed)
   Subjective:  Comfortable epidural in place, feeling more pressure with contractions  Objective:   Vitals: Blood pressure 111/64, pulse 75, temperature 97.9 F (36.6 C), temperature source Oral, resp. rate 16, height 5\' 1"  (1.549 m), weight 85.3 kg, last menstrual period 09/02/2019, SpO2 99 %, unknown if currently breastfeeding. General: NAD Abdomen: gravid, non-tender Cervical Exam:  Dilation: 6.5 Effacement (%): 100 Cervical Position: Middle Station: 0 Presentation: Vertex Exam by:: Theresa Mulligan RN  FHT: 120, moderate variability, +accels, still with occasional subtle late deceleration Toco: q3-14min  Results for orders placed or performed during the hospital encounter of 06/01/20 (from the past 24 hour(s))  Type and screen [place]     Status: None   Collection Time: 06/01/20  5:18 PM  Result Value Ref Range   ABO/RH(D) O POS    Antibody Screen NEG    Sample Expiration      06/04/2020,2359 Performed at [facility], [address] [place], [place] [address]   ROM Plus [facility] only)     Status: None   Collection Time: 06/01/20  9:47 PM  Result Value Ref Range   Rom Plus POSITIVE   SARS Coronavirus 2 by RT PCR (hospital order, performed in [place] hospital lab) Nasopharyngeal Nasopharyngeal Swab     Status: None   Collection Time: 06/01/20  9:47 PM   Specimen: Nasopharyngeal Swab  Result Value Ref Range   SARS Coronavirus 2 NEGATIVE NEGATIVE  CBC on admission     Status: Abnormal   Collection Time: 06/01/20 10:09 PM  Result Value Ref Range   WBC 15.4 (H) 4.0 - 10.5 K/uL   RBC 3.69 (L) 3.87 - 5.11 MIL/uL   Hemoglobin 11.9 (L) 12.0 - 15.0 g/dL   HCT 33.9 (L) 36 - 46 %   MCV 91.9 80.0 - 100.0 fL   MCH 32.2 26.0 - 34.0 pg   MCHC 35.1 30.0 - 36.0 g/dL   RDW 13.7 11.5 - 15.5 %   Platelets 197 150 - 400 K/uL   nRBC 0.0 0.0 - 0.2 %    Assessment:   29 y.o. G2P1001 [redacted]w[redacted]d TOLAC  Plan:   1) Labor - good cervical change, still without  need for augmentation  2) Fetus - cat II  [name], MD, [facility], [place] [facility] 06/02/2020, 2:26 AM

## 2020-06-02 NOTE — Discharge Summary (Addendum)
Postpartum Discharge Summary  Date of Service updated: 06/04/2020     Patient Name: Heather Conner DOB: Feb 13, 1991 MRN: [id number]  Date of admission: 06/01/2020 Delivery date:06/02/2020  Delivering provider: [name]  Date of discharge: 06/04/2020  Admitting diagnosis: Normal labor [O80, Z37.9] Intrauterine pregnancy: [redacted]w[redacted]d    Secondary diagnosis:  Active Problems:   Normal labor   Failed trial of labor following previous cesarean, delivered   Encounter for care or examination of lactating mother  Additional problems: Failed TOLAC    Discharge diagnosis: Term Pregnancy Delivered                                              Post partum procedures:none Augmentation: Pitocin Complications: None  Hospital course: Onset of Labor With Unplanned C/S   29y.o. yo G2P2002 at 343w1das admitted in Active Labor on 06/01/2020. Patient had a labor course significant for progression to 10cm, after 2-hrs of pushing no appreciable descent in fetal station.. The patient went for cesarean section due to Arrest of Descent. Delivery details as follows: Membrane Rupture Time/Date: 8:45 PM ,06/01/2020   Delivery Method:C-Section, Low Transverse  Details of operation can be found in separate operative note. Patient had an uncomplicated postpartum course.  She is ambulating,tolerating a regular diet, passing flatus, and urinating well.  Patient is discharged home in stable condition 06/04/20.  Newborn Data: Birth date:06/02/2020  Birth time:11:14 AM  Gender:Female  Living status:Living  Apgars:8 ,9  Weight:3380 g   Magnesium Sulfate received: No BMZ received: No Rhophylac:N/A MMR:N/A T-DaP:Given prenatally Flu: N/A Transfusion:No  Physical exam  Vitals:   06/03/20 1951 06/03/20 2359 06/04/20 0402 06/04/20 0800  BP: 110/79 107/75 107/83 111/80  Pulse: 94 86 70 79  Resp: 18 18 18 20   Temp: 98.1 F (36.7 C) 98.2 F (36.8 C) 97.8 F (36.6 C) 98.3 F (36.8 C)  TempSrc: Oral Oral Oral  Oral  SpO2: 100% 98% 98% 100%  Weight:      Height:       General: alert, cooperative and no distress Lochia: appropriate Uterine Fundus: firm Incision: Healing well with no significant drainage, Dressing is clean, dry, and intact DVT Evaluation: No evidence of DVT seen on physical exam. Labs: Lab Results  Component Value Date   WBC 9.1 06/04/2020   HGB 8.3 (L) 06/04/2020   HCT 24.1 (L) 06/04/2020   MCV 96.4 06/04/2020   PLT 152 06/04/2020   CMP Latest Ref Rng & Units 04/11/2019  Glucose 70 - 99 mg/dL 84  BUN 6 - 23 mg/dL 16  Creatinine 0.40 - 1.20 mg/dL 0.93  Sodium 135 - 145 mEq/L 137  Potassium 3.5 - 5.1 mEq/L 4.2  Chloride 96 - 112 mEq/L 104  CO2 19 - 32 mEq/L 25  Calcium 8.4 - 10.5 mg/dL 9.4  Total Protein 6.0 - 8.3 g/dL 7.2  Total Bilirubin 0.2 - 1.2 mg/dL 0.4  Alkaline Phos 39 - 117 U/L 93  AST 0 - 37 U/L 16  ALT 0 - 35 U/L 19   Edinburgh Score: Edinburgh Postnatal Depression Scale Screening Tool 06/02/2020  I have been able to laugh and see the funny side of things. 0  I have looked forward with enjoyment to things. 0  I have blamed myself unnecessarily when things went wrong. 1  I have been anxious or worried for no  good reason. 0  I have felt scared or panicky for no good reason. 0  Things have been getting on top of me. 1  I have been so unhappy that I have had difficulty sleeping. 0  I have felt sad or miserable. 0  I have been so unhappy that I have been crying. 0  The thought of harming myself has occurred to me. 0  Edinburgh Postnatal Depression Scale Total 2      After visit meds:  Allergies as of 06/04/2020   No Known Allergies     Medication List    STOP taking these medications   omeprazole 20 MG capsule Commonly known as: PRILOSEC     TAKE these medications   ferrous sulfate 325 (65 FE) MG tablet Commonly known as: FerrouSul Take 1 tablet (325 mg total) by mouth daily with breakfast.   multivitamin-prenatal 27-0.8 MG Tabs tablet Take  1 tablet by mouth daily at 12 noon.   oxyCODONE 5 MG immediate release tablet Commonly known as: Roxicodone Take 1 tablet (5 mg total) by mouth every 6 (six) hours as needed for up to 5 days for severe pain.            Discharge Care Instructions  (From admission, onward)         Start     Ordered   06/04/20 0000  Discharge wound care:       Comments: Keep incision dry, clean.   06/04/20 1502           Discharge home in stable condition Infant Feeding: Bottle Infant Disposition:home with mother Discharge instruction: per After Visit Summary and Postpartum booklet. Activity: Advance as tolerated. Pelvic rest for 6 weeks.  Diet: routine diet Anticipated Birth Control: undecided- discuss at incision check visit Postpartum Appointment:1 week Additional Postpartum F/U: none Future Appointments: Future Appointments  Date Time Provider [place]  06/06/2020 10:00 AM [name], MD [facility] None   Follow up Visit:  Follow-up Information    [name], MD In 1 week.   Specialty: Obstetrics and Gynecology Why: For wound re-check Contact information: [address] [place] [address] [phone]                   06/04/2020 Rod Can, CNM

## 2020-06-02 NOTE — Op Note (Addendum)
Preoperative Diagnosis: 1) 29 y.o. D5H2992 at [redacted]w[redacted]d with failure to descend 2) Failed TOLAC  Postoperative Diagnosis: 1) 29 y.o. E2A8341 at [redacted]w[redacted]d with failure to descent 2) Failed TOLAC  Operation Performed: Repeat low transverse C-section via pfannenstiel skin incision  Indiciation: Failure to descend, patient got to 10cm, after 2-hrs of pushing without any appreciable descent in station decision made to proceed with repeat low transverse C-section  Anesthesia: Epidural  Primary Surgeon: [name], MD   Assistant: Dalia Heading, CNM this surgery required a high level surgical assistant with none other readily available  Preoperative Antibiotics: 2g ancef and 500mg  of azithromycin  Estimated Blood Loss: 910 mL  IV Fluids: 1214mL  Urine Output:: 146mL  Drains or Tubes: Foley to gravity drainage, ON-Q catheter system  Implants: none  Specimens Removed: none  Complications: none  Intraoperative Findings:  Normal tubes ovaries and uterus.  Delivery resulted in the birth of a liveborn female, APGAR (1 MIN): 8   APGAR (5 MINS): 9, weight 7lbs 7oz.  Very distended small bowl.    Patient Condition:stable  Procedure in Detail:  Patient was taken to the operating room were she was administered regional anesthesia.  She was positioned in the supine position, prepped and draped in the  Usual sterile fashion.  Prior to proceeding with the case a time out was performed and the level of anesthetic was checked and noted to be adequate.  Utilizing the scalpel a pfannenstiel skin incision was made 2cm above the pubic symphysis utilizing the patient's pre-existing scar and carried down sharply to the the level of the rectus fascia.  The fascia was incised in the midline using the scalpel and then extended using mayo scissors.  The superior border of the rectus fascia was grasped with two Kocher clamps and the underlying rectus muscles were dissected of the fascia using blunt  dissection.  The median raphae was incised using Mayo scissors.   The inferior border of the rectus fascia was dissected of the rectus muscles in a similar fashion.  The midline was identified, the peritoneum was entered bluntly and expanded using manual tractions.  The uterus was noted to be in a none rotated position.  Small bowl noted prolapsing in front of the uterus and was packed away with two moist laps.  Next the bladder blade was placed retracting the bladder caudally.  A bladder flap was  created.  The bladder reflection was grasped with a pickup, and Metzenbaum scissors were then used the undermine the bladder reflection.  The bladder flap was developed using digital dissection.  The bladder blade was replaced retracting the bladder caudally out of the operative field. A low transverse incision was scored on the lower uterine segment.  The hysterotomy was entered bluntly using the operators finger.  The hysterotomy incision was extended using manual traction.  The operators hand was placed within the hysterotomy position noting the fetus to be within the straight OA position.  The vertex was grasped, flexed, brought to the incision, and delivered a traumatically using fundal pressure.  The remainder of the body delivered with ease.  The infant was suctioned, cord was clamped and cut before handing off to the awaiting neonatologist.  The placenta was delivered using manual extraction.  The two moist laps were removed.  The uterus was exteriorized, one moist lap was replaced to pack away the bowl.  The uterus was wiped clean of clots and debris using two moist laps.  The hysterotomy was closed using a two  layer closure of 0 Vicryl, with the first being a running locked, the second a vertical imbricating.  There was a right later lower uterine extension which was oversown with additional running locked 0 Vicryl.  The uterus was returned to the abdomen.  The hysterotomy incision was re-inspected noted to  be hemostatic.  The previously placed abdominal lap was removed.  Peritoneum was closed using 2-0 Vicryl in a running fashion.  The rectus muscles were re-approximated in the midline using a single 2-0 Vicryl mattress stitch.  The rectus muscles were inspected noted to be hemostatic.  The superior border of the rectus fascia was grasped with a Kocher clamp.  The ON-Q trocars were then placed 4cm above the superior border of the incision and tunneled subfascially.  The introducers were removed and the catheters were threaded through the sleeves after which the sleeves were removed.  The fascia was closed using a looped #1 PDS in a running fashion taking 1cm by 1cm bites.  The subcutaneous tissue was irrigated using warm saline, hemostasis achieved using the bovie.  The subcutaneous dead space was less than 3cm and was not closed.  The skin was closed using 4-0 Monocryl in a subcuticular fashion.  Sponge needle and instrument counts were corrects times two.  The patient tolerated the procedure well and was taken to the recovery room in stable condition.

## 2020-06-02 NOTE — Plan of Care (Signed)
Alert and oriented with pleasant affect. Color good, skin W&D. BBS clear. Fundus is firm at U/E. Moderate Lochia. Assessment and VSS. Sero sang. Drainage noted to On Q pump. Will change if increases in amount. Pt. States pain is a "4" and scheduled Toradol given as per order. Honeycomb dressing Dry and intact.

## 2020-06-03 LAB — CBC
HCT: 25.1 % — ABNORMAL LOW (ref 36.0–46.0)
Hemoglobin: 8.2 g/dL — ABNORMAL LOW (ref 12.0–15.0)
MCH: 31.9 pg (ref 26.0–34.0)
MCHC: 32.7 g/dL (ref 30.0–36.0)
MCV: 97.7 fL (ref 80.0–100.0)
Platelets: 126 10*3/uL — ABNORMAL LOW (ref 150–400)
RBC: 2.57 MIL/uL — ABNORMAL LOW (ref 3.87–5.11)
RDW: 14.1 % (ref 11.5–15.5)
WBC: 12.2 10*3/uL — ABNORMAL HIGH (ref 4.0–10.5)
nRBC: 0 % (ref 0.0–0.2)

## 2020-06-03 NOTE — Progress Notes (Signed)
Subjective:  Doing well.  Appropriate lochia.  Pain well controlled on oral analgesics.  Still has fairly prominent labial swelling.    Objective:  Vital signs in last 24 hours: Temp:  [97.7 F (36.5 C)-98.6 F (37 C)] 97.7 F (36.5 C) (08/08 0840) Pulse Rate:  [68-101] 83 (08/08 0840) Resp:  [18-22] 18 (08/08 0840) BP: (100-122)/(62-82) 111/76 (08/08 0840) SpO2:  [94 %-100 %] 99 % (08/08 0840)    Intake/Output      08/07 0701 - 08/08 0700 08/08 0701 - 08/09 0700   P.O. 640    I.V. (mL/kg) 200 (2.3)    Other     IV Piggyback     Total Intake(mL/kg) 840 (9.8)    Urine (mL/kg/hr) 950 (0.5) 200 (0.8)   Blood 910    Total Output 1860 200   Net -1020 -200          General: NAD Pulmonary: no increased work of breathing Abdomen: non-distended, non-tender, fundus firm at level of umbilicus Incision: D/C/I Extremities: no edema, no erythema, no tenderness  Results for orders placed or performed during the hospital encounter of 06/01/20 (from the past 72 hour(s))  Type and screen [place]     Status: None   Collection Time: 06/01/20  5:18 PM  Result Value Ref Range   ABO/RH(D) O POS    Antibody Screen NEG    Sample Expiration      06/04/2020,2359 Performed at [facility], [address] [place], [place] [address]   ROM Plus [facility] only)     Status: None   Collection Time: 06/01/20  9:47 PM  Result Value Ref Range   Rom Plus POSITIVE     Comment: Performed at [facility], [address] [place], [place] [address]  SARS Coronavirus 2 by RT PCR (hospital order, performed in [facility] hospital lab) Nasopharyngeal Nasopharyngeal Swab     Status: None   Collection Time: 06/01/20  9:47 PM   Specimen: Nasopharyngeal Swab  Result Value Ref Range   SARS Coronavirus 2 NEGATIVE NEGATIVE    Comment: (NOTE) SARS-CoV-2 target nucleic acids are NOT DETECTED.  The SARS-CoV-2 RNA is generally detectable in upper and lower respiratory  specimens during the acute phase of infection. The lowest concentration of SARS-CoV-2 viral copies this assay can detect is 250 copies / mL. A negative result does not preclude SARS-CoV-2 infection and should not be used as the sole basis for treatment or other patient management decisions.  A negative result may occur with improper specimen collection / handling, submission of specimen other than nasopharyngeal swab, presence of viral mutation(s) within the areas targeted by this assay, and inadequate number of viral copies (<250 copies / mL). A negative result must be combined with clinical observations, patient history, and epidemiological information.  Fact Sheet for Patients:   StrictlyIdeas.no  Fact Sheet for Healthcare Providers: BankingDealers.co.za  This test is not yet approved or  cleared by the [place] FDA and has been authorized for detection and/or diagnosis of SARS-CoV-2 by FDA under an Emergency Use Authorization (EUA).  This EUA will remain in effect (meaning this test can be used) for the duration of the COVID-19 declaration under Section 564(b)(1) of the Act, 21 U.S.C. section 360bbb-3(b)(1), unless the authorization is terminated or revoked sooner.  Performed at [facility], [address] [place], [place] [address]   RPR     Status: None   Collection Time: 06/01/20 10:09 PM  Result Value Ref Range   RPR Ser Ql NON REACTIVE NON REACTIVE    Comment: Performed at [facility], [place] [address], [place], [place] [address]  CBC on admission     Status: Abnormal   Collection Time: 06/01/20 10:09 PM  Result Value Ref Range   WBC 15.4 (H) 4.0 - 10.5 K/uL   RBC 3.69 (L) 3.87 - 5.11 MIL/uL   Hemoglobin 11.9 (L) 12.0 - 15.0 g/dL   HCT 33.9 (L) 36 - 46 %   MCV 91.9 80.0 - 100.0 fL   MCH 32.2 26.0 - 34.0 pg   MCHC 35.1 30.0 - 36.0 g/dL   RDW 13.7 11.5 - 15.5 %   Platelets 197 150 - 400 K/uL   nRBC  0.0 0.0 - 0.2 %    Comment: Performed at [facility], [address] [place], [place] [address]  CBC     Status: Abnormal   Collection Time: 06/03/20  5:23 AM  Result Value Ref Range   WBC 12.2 (H) 4.0 - 10.5 K/uL   RBC 2.57 (L) 3.87 - 5.11 MIL/uL   Hemoglobin 8.2 (L) 12.0 - 15.0 g/dL    Comment: REPEATED TO VERIFY   HCT 25.1 (L) 36 - 46 %   MCV 97.7 80.0 - 100.0 fL   MCH 31.9 26.0 - 34.0 pg   MCHC 32.7 30.0 - 36.0 g/dL   RDW 14.1 11.5 - 15.5 %   Platelets 126 (L) 150 - 400 K/uL   nRBC 0.0 0.0 - 0.2 %    Comment: Performed at [facility], [address], [place],  [address]    Immunization History  Administered Date(s) Administered  . Influenza,inj,Quad PF,6+ Mos 11/01/2019  . Influenza-Unspecified 12/21/2018  . Tdap 01/27/2017, 04/05/2020    Assessment:   29 y.o. D7O2423 postoperativeday #1 RLTCS failed TOLAC   Plan:  ) Acute blood loss anemia - hemodynamically stable and asymptomatic - po ferrous sulfate  2) Blood Type --/--/O POS (08/06 1718) / Rubella 4.46 (01/05 1625) / Varicella Immune  3) TDAP status up to date  4) Feeding plan breast  6) Labial edema - continue foley catheter  7) Disposition - anticipate discharge PPD2-3  [name], MD, [place], [facility] 06/03/2020, 9:58 AM

## 2020-06-03 NOTE — Anesthesia Postprocedure Evaluation (Signed)
Anesthesia Post Note  Patient: Heather Conner  Procedure(s) Performed: CESAREAN SECTION (N/A )  Patient location during evaluation: Mother Baby Anesthesia Type: Epidural Level of consciousness: awake and alert Pain management: pain level controlled Vital Signs Assessment: post-procedure vital signs reviewed and stable Respiratory status: spontaneous breathing, nonlabored ventilation and respiratory function stable Cardiovascular status: stable Postop Assessment: no headache, no backache, epidural receding and able to ambulate Anesthetic complications: no   No complications documented.   Last Vitals:  Vitals:   06/03/20 0800 06/03/20 0840  BP:  111/76  Pulse:  83  Resp:  18  Temp:  36.5 C  SpO2: 98% 99%    Last Pain:  Vitals:   06/03/20 0840  TempSrc: Oral  PainSc:                  Tera Mater

## 2020-06-03 NOTE — Plan of Care (Signed)
Alert and oriented with pleasant affect. Color good, skin w&d. Percocet given for c/o abdominal cramping. Abdomen is sl. Distended but soft. States she is passing gas. Tolerating regular diet. Simethicone as ordered and encouraged ambulation and Pt. V/o. Labia majora and minora cont. To be very edematous; right side greater then left. Peri care with Tucks, dibucaine and ice pack applied. Pt. States ice pack provides comfort in peri-area. Up ambulating in room from chair to bed and tolerated well. Appears to be bonding well with Infant. Lower abdominal incision with Honeycomb dressing d&Y. Foley to straight draining clear amber urine. Pt. Appears comfortable and in NAD> Husband supportive and at bedside.

## 2020-06-04 ENCOUNTER — Encounter: Payer: Self-pay | Admitting: Obstetrics and Gynecology

## 2020-06-04 LAB — CBC
HCT: 24.1 % — ABNORMAL LOW (ref 36.0–46.0)
Hemoglobin: 8.3 g/dL — ABNORMAL LOW (ref 12.0–15.0)
MCH: 33.2 pg (ref 26.0–34.0)
MCHC: 34.4 g/dL (ref 30.0–36.0)
MCV: 96.4 fL (ref 80.0–100.0)
Platelets: 152 10*3/uL (ref 150–400)
RBC: 2.5 MIL/uL — ABNORMAL LOW (ref 3.87–5.11)
RDW: 14.4 % (ref 11.5–15.5)
WBC: 9.1 10*3/uL (ref 4.0–10.5)
nRBC: 0 % (ref 0.0–0.2)

## 2020-06-04 MED ORDER — OXYCODONE HCL 5 MG PO TABS: 5.0000 mg | ORAL_TABLET | Freq: Four times a day (QID) | ORAL | 0 refills | Status: AC | PRN

## 2020-06-04 NOTE — Progress Notes (Signed)
Obstetric Postpartum/PostOperative Daily Progress Note Subjective:  29 y.o. [id number] post-operative day # 2 status post repeat cesarean delivery.  She is ambulating, is tolerating po, is not yet voiding spontaneously. Foley catheter is still in place due to labial swelling. Plan to discontinue foley this morning.  Her pain is well controlled on PO pain medications. Her lochia is less than menses. She would like to discharge to home later today if she is able to void after removing foley catheter.   Medications SCHEDULED MEDICATIONS  . ibuprofen  800 mg Oral Q8H  . prenatal multivitamin  1 tablet Oral Q1200  . senna-docusate  2 tablet Oral Q24H  . simethicone  80 mg Oral TID PC  . simethicone  80 mg Oral Q24H    MEDICATION INFUSIONS  . lactated ringers Stopped (06/02/20 1707)    PRN MEDICATIONS  coconut oil, witch hazel-glycerin **AND** dibucaine, diphenhydrAMINE, menthol-cetylpyridinium, oxyCODONE-acetaminophen, simethicone    Objective:   Vitals:   06/03/20 1951 06/03/20 2359 06/04/20 0402 06/04/20 0800  BP: 110/79 107/75 107/83 111/80  Pulse: 94 86 70 79  Resp: 18 18 18 20   Temp: 98.1 F (36.7 C) 98.2 F (36.8 C) 97.8 F (36.6 C) 98.3 F (36.8 C)  TempSrc: Oral Oral Oral Oral  SpO2: 100% 98% 98% 100%  Weight:      Height:        Current Vital Signs 24h Vital Sign Ranges  T 98.3 F (36.8 C) Temp  Avg: 98.1 F (36.7 C)  Min: 97.8 F (36.6 C)  Max: 98.3 F (36.8 C)  BP 111/80 BP  Min: 107/83  Max: 117/76  HR 79 Pulse  Avg: 81.4  Min: 70  Max: 94  RR 20 Resp  Avg: 18.5  Min: 18  Max: 20  SaO2 100 % Room Air SpO2  Avg: 99 %  Min: 98 %  Max: 100 %       24 Hour I/O Current Shift I/O  Time Ins Outs 08/08 0701 - 08/09 0700 In: -  Out: 4800 [Urine:4800] No intake/output data recorded.  Foley catheter  General: NAD Pulmonary: no increased work of breathing Abdomen: non-distended, non-tender, fundus firm at level of umbilicus Inc: Clean/dry/intact Extremities: no  edema, no erythema, no tenderness  Labs:  Recent Labs  Lab 06/01/20 2209 06/03/20 0523 06/04/20 0609  WBC 15.4* 12.2* 9.1  HGB 11.9* 8.2* 8.3*  HCT 33.9* 25.1* 24.1*  PLT 197 126* 152     Assessment:   29 y.o. G2P2002 postoperative day # 2 status post repeat cesarean section, lactating  Plan:  1) Acute blood loss anemia - hemodynamically stable and asymptomatic - po ferrous sulfate  2) O POS / Rubella 4.46 (01/05 1625)/ Varicella Immune  3) TDAP status: given antepartum  4) breastfeeding/Contraception = not discussed- will address when rounding later   5) Discontinue foley catheter  6) Disposition: continue current care, possible discharge later today pending progress   Rod Can, CNM 06/04/2020 10:54 AM

## 2020-06-04 NOTE — Discharge Instructions (Signed)
Postpartum Baby Blues The postpartum period begins right after the birth of a baby. During this time, there is often a lot of joy and excitement. It is also a time of many changes in the life of the parents. No matter how many times a mother gives birth, each child brings new challenges to the family, including different ways of relating to one another. It is common to have feelings of excitement along with confusing changes in moods, emotions, and thoughts. You may feel happy one minute and sad or stressed the next. These feelings of sadness usually happen in the period right after you have your baby, and they go away within a week or two. This is called the "baby blues." What are the causes? There is no known cause of baby blues. It is likely caused by a combination of factors. However, changes in hormone levels after childbirth are believed to trigger some of the symptoms. Other factors that can play a role in these mood changes include:  Lack of sleep.  Stressful life events, such as poverty, caring for a loved one, or death of a loved one.  Genetics. What are the signs or symptoms? Symptoms of this condition include:  Brief changes in mood, such as going from extreme happiness to sadness.  Decreased concentration.  Difficulty sleeping.  Crying spells and tearfulness.  Loss of appetite.  Irritability.  Anxiety. If the symptoms of baby blues last for more than 2 weeks or become more severe, you may have postpartum depression. How is this diagnosed? This condition is diagnosed based on an evaluation of your symptoms. There are no medical or lab tests that lead to a diagnosis, but there are various questionnaires that a health care provider may use to identify women with the baby blues or postpartum depression. How is this treated? Treatment is not needed for this condition. The baby blues usually go away on their own in 1-2 weeks. Social support is often all that is needed. You will  be encouraged to get adequate sleep and rest. Follow these instructions at home: Lifestyle      Get as much rest as you can. Take a nap when the baby sleeps.  Exercise regularly as told by your health care provider. Some women find yoga and walking to be helpful.  Eat a balanced and nourishing diet. This includes plenty of fruits and vegetables, whole grains, and lean proteins.  Do little things that you enjoy. Have a cup of tea, take a bubble bath, read your favorite magazine, or listen to your favorite music.  Avoid alcohol.  Ask for help with household chores, cooking, grocery shopping, or running errands. Do not try to do everything yourself. Consider hiring a postpartum doula to help. This is a professional who specializes in providing support to new mothers.  Try not to make any major life changes during pregnancy or right after giving birth. This can add stress. General instructions  Talk to people close to you about how you are feeling. Get support from your partner, family members, friends, or other new moms. You may want to join a support group.  Find ways to cope with stress. This may include: ? Writing your thoughts and feelings in a journal. ? Spending time outside. ? Spending time with people who make you laugh.  Try to stay positive in how you think. Think about the things you are grateful for.  Take over-the-counter and prescription medicines only as told by your health care provider.  Let your health care provider know if you have any concerns.  Keep all postpartum visits as told by your health care provider. This is important. Contact a health care provider if:  Your baby blues do not go away after 2 weeks. Get help right away if:  You have thoughts of taking your own life (suicidal thoughts).  You think you may harm the baby or other people.  You see or hear things that are not there (hallucinations). Summary  After giving birth, you may feel happy  one minute and sad or stressed the next. Feelings of sadness that happen right after the baby is born and go away after a week or two are called the "baby blues."  You can manage the baby blues by getting enough rest, eating a healthy diet, exercising, spending time with supportive people, and finding ways to cope with stress.  If feelings of sadness and stress last longer than 2 weeks or get in the way of caring for your baby, talk to your health care provider. This may mean you have postpartum depression. This information is not intended to replace advice given to you by your health care provider. Make sure you discuss any questions you have with your health care provider. Document Revised: 02/04/2019 Document Reviewed: 12/09/2016 Elsevier Patient Education  2020 [place]. Postpartum Care After Cesarean Delivery This sheet gives you information about how to care for yourself from the time you deliver your baby to up to 6-12 weeks after delivery (postpartum period). Your health care provider may also give you more specific instructions. If you have problems or questions, contact your health care provider. Follow these instructions at home: Medicines  Take over-the-counter and prescription medicines only as told by your health care provider.  If you were prescribed an antibiotic medicine, take it as told by your health care provider. Do not stop taking the antibiotic even if you start to feel better.  Ask your health care provider if the medicine prescribed to you: ? Requires you to avoid driving or using heavy machinery. ? Can cause constipation. You may need to take actions to prevent or treat constipation, such as:  Drink enough fluid to keep your urine pale yellow.  Take over-the-counter or prescription medicines.  Eat foods that are high in fiber, such as beans, whole grains, and fresh fruits and vegetables.  Limit foods that are high in fat and processed sugars, such as fried or  sweet foods. Activity  Gradually return to your normal activities as told by your health care provider.  Avoid activities that take a lot of effort and energy (are strenuous) until approved by your health care provider. Walking at a slow to moderate pace is usually safe. Ask your health care provider what activities are safe for you. ? Do not lift anything that is heavier than your baby or 10 lb (4.5 kg) as told by your health care provider. ? Do not vacuum, climb stairs, or drive a car for as long as told by your health care provider.  If possible, have someone help you at home until you are able to do your usual activities yourself.  Rest as much as possible. Try to rest or take naps while your baby is sleeping. Vaginal bleeding  It is normal to have vaginal bleeding (lochia) after delivery. Wear a sanitary pad to absorb vaginal bleeding and discharge. ? During the first week after delivery, the amount and appearance of lochia is often similar to a menstrual  period. ? Over the next few weeks, it will gradually decrease to a dry, yellow-brown discharge. ? For most women, lochia stops completely by 4-6 weeks after delivery. Vaginal bleeding can vary from woman to woman.  Change your sanitary pads frequently. Watch for any changes in your flow, such as: ? A sudden increase in volume. ? A change in color. ? Large blood clots.  If you pass a blood clot, save it and call your health care provider to discuss. Do not flush blood clots down the toilet before you get instructions from your health care provider.  Do not use tampons or douches until your health care provider says this is safe.  If you are not breastfeeding, your period should return 6-8 weeks after delivery. If you are breastfeeding, your period may return anytime between 8 weeks after delivery and the time that you stop breastfeeding. Perineal care   If your C-section (Cesarean section) was unplanned, and you were allowed to  labor and push before delivery, you may have pain, swelling, and discomfort of the tissue between your vaginal opening and your anus (perineum). You may also have an incision in the tissue (episiotomy) or the tissue may have torn during delivery. Follow these instructions as told by your health care provider: ? Keep your perineum clean and dry as told by your health care provider. Use medicated pads and pain-relieving sprays and creams as directed. ? If you have an episiotomy or vaginal tear, check the area every day for signs of infection. Check for:  Redness, swelling, or pain.  Fluid or blood.  Warmth.  Pus or a bad smell. ? You may be given a squirt bottle to use instead of wiping to clean the perineum area after you go to the bathroom. As you start healing, you may use the squirt bottle before wiping yourself. Make sure to wipe gently. ? To relieve pain caused by an episiotomy, vaginal tear, or hemorrhoids, try taking a warm sitz bath 2-3 times a day. A sitz bath is a warm water bath that is taken while you are sitting down. The water should only come up to your hips and should cover your buttocks. Breast care  Within the first few days after delivery, your breasts may feel heavy, full, and uncomfortable (breast engorgement). You may also have milk leaking from your breasts. Your health care provider can suggest ways to help relieve breast discomfort. Breast engorgement should go away within a few days.  If you are breastfeeding: ? Wear a bra that supports your breasts and fits you well. ? Keep your nipples clean and dry. Apply creams and ointments as told by your health care provider. ? You may need to use breast pads to absorb milk leakage. ? You may have uterine contractions every time you breastfeed for several weeks after delivery. Uterine contractions help your uterus return to its normal size. ? If you have any problems with breastfeeding, work with your health care provider or a  Science writer.  If you are not breastfeeding: ? Avoid touching your breasts as this can make your breasts produce more milk. ? Wear a well-fitting bra and use cold packs to help with swelling. ? Do not squeeze out (express) milk. This causes you to make more milk. Intimacy and sexuality  Ask your health care provider when you can engage in sexual activity. This may depend on your: ? Risk of infection. ? Healing rate. ? Comfort and desire to engage in sexual activity.  You are able to get pregnant after delivery, even if you have not had your period. If desired, talk with your health care provider about methods of family planning or birth control (contraception). Lifestyle  Do not use any products that contain nicotine or tobacco, such as cigarettes, e-cigarettes, and chewing tobacco. If you need help quitting, ask your health care provider.  Do not drink alcohol, especially if you are breastfeeding. Eating and drinking   Drink enough fluid to keep your urine pale yellow.  Eat high-fiber foods every day. These may help prevent or relieve constipation. High-fiber foods include: ? Whole grain cereals and breads. ? Brown rice. ? Beans. ? Fresh fruits and vegetables.  Take your prenatal vitamins until your postpartum checkup or until your health care provider tells you it is okay to stop. General instructions  Keep all follow-up visits for you and your baby as told by your health care provider. Most women visit their health care provider for a postpartum checkup within the first 3-6 weeks after delivery. Contact a health care provider if you:  Feel unable to cope with the changes that a new baby brings to your life, and these feelings do not go away.  Feel unusually sad or worried.  Have breasts that are painful, hard, or turn red.  Have a fever.  Have trouble holding urine or keeping urine from leaking.  Have little or no interest in activities you used to  enjoy.  Have not breastfed at all and you have not had a menstrual period for 12 weeks after delivery.  Have stopped breastfeeding and you have not had a menstrual period for 12 weeks after you stopped breastfeeding.  Have questions about caring for yourself or your baby.  Pass a blood clot from your vagina. Get help right away if you:  Have chest pain.  Have difficulty breathing.  Have sudden, severe leg pain.  Have severe pain or cramping in your abdomen.  Bleed from your vagina so much that you fill more than one sanitary pad in one hour. Bleeding should not be heavier than your heaviest period.  Develop a severe headache.  Faint.  Have blurred vision or spots in your vision.  Have a bad-smelling vaginal discharge.  Have thoughts about hurting yourself or your baby. If you ever feel like you may hurt yourself or others, or have thoughts about taking your own life, get help right away. You can go to your nearest emergency department or call:  Your local emergency services (911 in the [place]  A suicide crisis helpline, such as the [place] at 518-646-7013. This is open 24 hours a day. Summary  The period of time from when you deliver your baby to up to 6-12 weeks after delivery is called the postpartum period.  Gradually return to your normal activities as told by your health care provider.  Keep all follow-up visits for you and your baby as told by your health care provider. This information is not intended to replace advice given to you by your health care provider. Make sure you discuss any questions you have with your health care provider. Document Revised: 06/02/2018 Document Reviewed: 06/02/2018 Elsevier Patient Education  [place]. Breastfeeding  Choosing to breastfeed is one of the best decisions you can make for yourself and your baby. A change in hormones during pregnancy causes your breasts to make breast milk in your  milk-producing glands. Hormones prevent breast milk from being released before your baby  is born. They also prompt milk flow after birth. Once breastfeeding has begun, thoughts of your baby, as well as his or her sucking or crying, can stimulate the release of milk from your milk-producing glands. Benefits of breastfeeding Research shows that breastfeeding offers many health benefits for infants and mothers. It also offers a cost-free and convenient way to feed your baby. For your baby  Your first milk (colostrum) helps your baby's digestive system to function better.  Special cells in your milk (antibodies) help your baby to fight off infections.  Breastfed babies are less likely to develop asthma, allergies, obesity, or type 2 diabetes. They are also at lower risk for sudden infant death syndrome (SIDS).  Nutrients in breast milk are better able to meet your baby's needs compared to infant formula.  Breast milk improves your baby's brain development. For you  Breastfeeding helps to create a very special bond between you and your baby.  Breastfeeding is convenient. Breast milk costs nothing and is always available at the correct temperature.  Breastfeeding helps to burn calories. It helps you to lose the weight that you gained during pregnancy.  Breastfeeding makes your uterus return faster to its size before pregnancy. It also slows bleeding (lochia) after you give birth.  Breastfeeding helps to lower your risk of developing type 2 diabetes, osteoporosis, rheumatoid arthritis, cardiovascular disease, and breast, ovarian, uterine, and endometrial cancer later in life. Breastfeeding basics Starting breastfeeding  Find a comfortable place to sit or lie down, with your neck and back well-supported.  Place a pillow or a rolled-up blanket under your baby to bring him or her to the level of your breast (if you are seated). Nursing pillows are specially designed to help support your arms and  your baby while you breastfeed.  Make sure that your baby's tummy (abdomen) is facing your abdomen.  Gently massage your breast. With your fingertips, massage from the outer edges of your breast inward toward the nipple. This encourages milk flow. If your milk flows slowly, you may need to continue this action during the feeding.  Support your breast with 4 fingers underneath and your thumb above your nipple (make the letter "C" with your hand). Make sure your fingers are well away from your nipple and your baby's mouth.  Stroke your baby's lips gently with your finger or nipple.  When your baby's mouth is open wide enough, quickly bring your baby to your breast, placing your entire nipple and as much of the areola as possible into your baby's mouth. The areola is the colored area around your nipple. ? More areola should be visible above your baby's upper lip than below the lower lip. ? Your baby's lips should be opened and extended outward (flanged) to ensure an adequate, comfortable latch. ? Your baby's tongue should be between his or her lower gum and your breast.  Make sure that your baby's mouth is correctly positioned around your nipple (latched). Your baby's lips should create a seal on your breast and be turned out (everted).  It is common for your baby to suck about 2-3 minutes in order to start the flow of breast milk. Latching Teaching your baby how to latch onto your breast properly is very important. An improper latch can cause nipple pain, decreased milk supply, and poor weight gain in your baby. Also, if your baby is not latched onto your nipple properly, he or she may swallow some air during feeding. This can make your baby fussy. Burping  your baby when you switch breasts during the feeding can help to get rid of the air. However, teaching your baby to latch on properly is still the best way to prevent fussiness from swallowing air while breastfeeding. Signs that your baby has  successfully latched onto your nipple  Silent tugging or silent sucking, without causing you pain. Infant's lips should be extended outward (flanged).  Swallowing heard between every 3-4 sucks once your milk has started to flow (after your let-down milk reflex occurs).  Muscle movement above and in front of his or her ears while sucking. Signs that your baby has not successfully latched onto your nipple  Sucking sounds or smacking sounds from your baby while breastfeeding.  Nipple pain. If you think your baby has not latched on correctly, slip your finger into the corner of your baby's mouth to break the suction and place it between your baby's gums. Attempt to start breastfeeding again. Signs of successful breastfeeding Signs from your baby  Your baby will gradually decrease the number of sucks or will completely stop sucking.  Your baby will fall asleep.  Your baby's body will relax.  Your baby will retain a small amount of milk in his or her mouth.  Your baby will let go of your breast by himself or herself. Signs from you  Breasts that have increased in firmness, weight, and size 1-3 hours after feeding.  Breasts that are softer immediately after breastfeeding.  Increased milk volume, as well as a change in milk consistency and color by the fifth day of breastfeeding.  Nipples that are not sore, cracked, or bleeding. Signs that your baby is getting enough milk  Wetting at least 1-2 diapers during the first 24 hours after birth.  Wetting at least 5-6 diapers every 24 hours for the first week after birth. The urine should be clear or pale yellow by the age of 5 days.  Wetting 6-8 diapers every 24 hours as your baby continues to grow and develop.  At least 3 stools in a 24-hour period by the age of 5 days. The stool should be soft and yellow.  At least 3 stools in a 24-hour period by the age of 7 days. The stool should be seedy and yellow.  No loss of weight greater  than 10% of birth weight during the first 3 days of life.  Average weight gain of 4-7 oz (113-198 g) per week after the age of 4 days.  Consistent daily weight gain by the age of 5 days, without weight loss after the age of 2 weeks. After a feeding, your baby may spit up a small amount of milk. This is normal. Breastfeeding frequency and duration Frequent feeding will help you make more milk and can prevent sore nipples and extremely full breasts (breast engorgement). Breastfeed when you feel the need to reduce the fullness of your breasts or when your baby shows signs of hunger. This is called "breastfeeding on demand." Signs that your baby is hungry include:  Increased alertness, activity, or restlessness.  Movement of the head from side to side.  Opening of the mouth when the corner of the mouth or cheek is stroked (rooting).  Increased sucking sounds, smacking lips, cooing, sighing, or squeaking.  Hand-to-mouth movements and sucking on fingers or hands.  Fussing or crying. Avoid introducing a pacifier to your baby in the first 4-6 weeks after your baby is born. After this time, you may choose to use a pacifier. Research has shown  that pacifier use during the first year of a baby's life decreases the risk of sudden infant death syndrome (SIDS). Allow your baby to feed on each breast as long as he or she wants. When your baby unlatches or falls asleep while feeding from the first breast, offer the second breast. Because newborns are often sleepy in the first few weeks of life, you may need to awaken your baby to get him or her to feed. Breastfeeding times will vary from baby to baby. However, the following rules can serve as a guide to help you make sure that your baby is properly fed:  Newborns (babies 37 weeks of age or younger) may breastfeed every 1-3 hours.  Newborns should not go without breastfeeding for longer than 3 hours during the day or 5 hours during the night.  You should  breastfeed your baby a minimum of 8 times in a 24-hour period. Breast milk pumping     Pumping and storing breast milk allows you to make sure that your baby is exclusively fed your breast milk, even at times when you are unable to breastfeed. This is especially important if you go back to work while you are still breastfeeding, or if you are not able to be present during feedings. Your lactation consultant can help you find a method of pumping that works best for you and give you guidelines about how long it is safe to store breast milk. Caring for your breasts while you breastfeed Nipples can become dry, cracked, and sore while breastfeeding. The following recommendations can help keep your breasts moisturized and healthy:  Avoid using soap on your nipples.  Wear a supportive bra designed especially for nursing. Avoid wearing underwire-style bras or extremely tight bras (sports bras).  Air-dry your nipples for 3-4 minutes after each feeding.  Use only cotton bra pads to absorb leaked breast milk. Leaking of breast milk between feedings is normal.  Use lanolin on your nipples after breastfeeding. Lanolin helps to maintain your skin's normal moisture barrier. Pure lanolin is not harmful (not toxic) to your baby. You may also hand express a few drops of breast milk and gently massage that milk into your nipples and allow the milk to air-dry. In the first few weeks after giving birth, some women experience breast engorgement. Engorgement can make your breasts feel heavy, warm, and tender to the touch. Engorgement peaks within 3-5 days after you give birth. The following recommendations can help to ease engorgement:  Completely empty your breasts while breastfeeding or pumping. You may want to start by applying warm, moist heat (in the shower or with warm, water-soaked hand towels) just before feeding or pumping. This increases circulation and helps the milk flow. If your baby does not completely  empty your breasts while breastfeeding, pump any extra milk after he or she is finished.  Apply ice packs to your breasts immediately after breastfeeding or pumping, unless this is too uncomfortable for you. To do this: ? Put ice in a plastic bag. ? Place a towel between your skin and the bag. ? Leave the ice on for 20 minutes, 2-3 times a day.  Make sure that your baby is latched on and positioned properly while breastfeeding. If engorgement persists after 48 hours of following these recommendations, contact your health care provider or a Science writer. Overall health care recommendations while breastfeeding  Eat 3 healthy meals and 3 snacks every day. Well-nourished mothers who are breastfeeding need an additional 450-500 calories a day.  You can meet this requirement by increasing the amount of a balanced diet that you eat.  Drink enough water to keep your urine pale yellow or clear.  Rest often, relax, and continue to take your prenatal vitamins to prevent fatigue, stress, and low vitamin and mineral levels in your body (nutrient deficiencies).  Do not use any products that contain nicotine or tobacco, such as cigarettes and e-cigarettes. Your baby may be harmed by chemicals from cigarettes that pass into breast milk and exposure to secondhand smoke. If you need help quitting, ask your health care provider.  Avoid alcohol.  Do not use illegal drugs or marijuana.  Talk with your health care provider before taking any medicines. These include over-the-counter and prescription medicines as well as vitamins and herbal supplements. Some medicines that may be harmful to your baby can pass through breast milk.  It is possible to become pregnant while breastfeeding. If birth control is desired, ask your health care provider about options that will be safe while breastfeeding your baby. Where to find more information: [employer]: www.llli.org Contact a health care  provider if:  You feel like you want to stop breastfeeding or have become frustrated with breastfeeding.  Your nipples are cracked or bleeding.  Your breasts are red, tender, or warm.  You have: ? Painful breasts or nipples. ? A swollen area on either breast. ? A fever or chills. ? Nausea or vomiting. ? Drainage other than breast milk from your nipples.  Your breasts do not become full before feedings by the fifth day after you give birth.  You feel sad and depressed.  Your baby is: ? Too sleepy to eat well. ? Having trouble sleeping. ? More than 73 week old and wetting fewer than 6 diapers in a 24-hour period. ? Not gaining weight by 82 days of age.  Your baby has fewer than 3 stools in a 24-hour period.  Your baby's skin or the white parts of his or her eyes become yellow. Get help right away if:  Your baby is overly tired (lethargic) and does not want to wake up and feed.  Your baby develops an unexplained fever. Summary  Breastfeeding offers many health benefits for infant and mothers.  Try to breastfeed your infant when he or she shows early signs of hunger.  Gently tickle or stroke your baby's lips with your finger or nipple to allow the baby to open his or her mouth. Bring the baby to your breast. Make sure that much of the areola is in your baby's mouth. Offer one side and burp the baby before you offer the other side.  Talk with your health care provider or lactation consultant if you have questions or you face problems as you breastfeed. This information is not intended to replace advice given to you by your health care provider. Make sure you discuss any questions you have with your health care provider. Document Revised: 01/07/2018 Document Reviewed: 11/14/2016 Elsevier Patient Education  [place]. Breastfeeding Tips for a Good Latch Latching is how your baby's mouth attaches to your nipple to breastfeed. It is an important part of breastfeeding. Your  baby may have trouble latching for a number of reasons. A poor latch may cause you to have cracked or sore nipples or other problems. Follow these instructions at home: How to position your baby  Find a comfortable place to sit or lie down. Your neck and back should be well supported.  If you  are seated, place a pillow or rolled-up blanket under your baby. This will bring him or her to the level of your breast.  Make sure that your baby's belly (abdomen) is facing your belly.  Try different positions to find one that works best for you and your baby. How to help your baby latch   To start, gently rub your breast. Move your fingertips in a circle as you massage from your chest wall toward your nipple. This helps milk flow. Keep doing this during feeding if needed.  Position your breast. Hold your breast with four fingers underneath and your thumb above your nipple. Keep your fingers away from your nipple and your baby's mouth. Follow these steps to help your baby latch: 1. Rub your baby's lips gently with your finger or nipple. 2. When your baby's mouth is open wide enough, quickly bring your baby to your breast and place your whole nipple into your baby's mouth. Place as much of the colored area around your nipple (areola)as possible into your baby's mouth. 3. Your baby's tongue should be between his or her lower gum and your breast. 4. You should be able to see more areola above your baby's upper lip than below the lower lip. 5. When your baby starts sucking, you will feel a gentle pull on your nipple. You should not feel any pain. Be patient. It is common for a baby to suck for about 2-3 minutes to start the flow of breast milk. 6. Make sure that your baby's mouth is in the right position around your nipple. Your baby's lips should make a seal on your breast and be turned outward.  General instructions  Look for these signs that your baby has latched on to your nipple: ? The baby is  quietly tugging or sucking without causing you pain. ? You hear the baby swallow after every 3 or 4 sucks. ? You see movement above and in front of the baby's ears while he or she is sucking.  Be aware of these signs that your baby has not latched on to your nipple: ? The baby makes sucking sounds or smacking sounds while feeding. ? You have nipple pain.  If your baby is not latched well, put your little finger between your baby's gums and your nipple. This will break the seal. Then try to help your baby latch again.  If you keep having problems, get help from a breastfeeding specialist (Science writer). Contact a doctor if:  You have cracking or soreness in your nipples that lasts longer than 1 week.  You have nipple pain.  Your breasts are filled with too much milk (engorgement), and this does not improve after 48-72 hours.  You have a plugged milk duct and a fever.  You follow the tips for a good latch but you keep having problems or concerns.  You have a pus-like fluid coming from your breast.  Your baby is not gaining weight.  Your baby loses weight. Summary  Latching is how your baby's mouth attaches to your nipple to breastfeed.  Try different positions for breastfeeding to find one that works best for you and your baby.  A poor latch may cause you to have cracked or sore nipples or other problems. This information is not intended to replace advice given to you by your health care provider. Make sure you discuss any questions you have with your health care provider. Document Revised: 02/02/2019 Document Reviewed: 05/20/2017 Elsevier Patient Education  2020 Elsevier  Inc.  

## 2020-06-04 NOTE — Progress Notes (Addendum)
Pt discharged with infant. Discharge instructions, prescriptions, and follow up appointments given to and reviewed with patient. Incision kit given to and reviewed with patient. Pt verbalized understanding. Escorted out by auxillary.

## 2020-06-04 NOTE — Lactation Note (Signed)
This note was copied from a baby's chart. Lactation Consultation Note  Patient Name: Heather Conner [id number] Date: 06/04/2020   Mom reports starting out breast feeding with first baby.  At 2 months mom ended up with mastitis that she kept having to get treated for and ended up in the hospital for 6 days of IV antibiotics.  After that she decided to give it up.  Mom's choice is to just bottle feed formula with Chrys Racer.  She is tolerating it well and has no desire to pump or breast feed her.  Hand out given on Infant Formula Preparation reviewing storage, sanitizing of workspace and feeding and preparation equipment, safely mixing water with powdered formula by using sterile water or boiling technique, warming formula bottles and how to protect Chrys Racer from [employer].  Discussed how to prevent mature breast milk from coming in by wearing well fitting supportive bra, cold, cabbage leaves and not stimulating or extracting breast milk.  Discussed differences, prevention and treatment of full breasts, engorgement, plugged ducts and mastitis and when to seek further help.  Lactation contact numbers given and encouraged to call with any questions, concerns or assistance.  Maternal Data    Feeding Feeding Type: Bottle Fed - Formula  LATCH Score                   Interventions    Lactation Tools Discussed/Used     Consult Status      Jarold Motto 06/04/2020, 4:06 PM

## 2020-06-05 ENCOUNTER — Encounter: Payer: Self-pay | Admitting: Obstetrics and Gynecology

## 2020-06-05 ENCOUNTER — Ambulatory Visit (INDEPENDENT_AMBULATORY_CARE_PROVIDER_SITE_OTHER): Payer: BC Managed Care – PPO | Admitting: Obstetrics and Gynecology

## 2020-06-05 ENCOUNTER — Other Ambulatory Visit: Payer: Self-pay

## 2020-06-05 VITALS — BP 126/82 | Wt 185.0 lb

## 2020-06-05 DIAGNOSIS — Z4889 Encounter for other specified surgical aftercare: Secondary | ICD-10-CM

## 2020-06-05 NOTE — Progress Notes (Signed)
Postoperative Follow-up Patient presents post op from RLTCS 1weeks ago for repeat failed TOLAC.  Subjective: Patient reports some improvement in her preop symptoms. Eating a regular diet without difficulty. Pain is controlled without any medications.  Activity: normal activities of daily living. Reports leaking from ON-Q  Objective: Blood pressure 126/82, weight 185 lb (83.9 kg), not currently breastfeeding.  General: NAD Pulmonary: no increased work of breathing Abdomen: soft, non-tender, non-distended, incision(s) D/C/I ON-Q catheters removed Extremities: no edema Neurologic: normal gait    Admission on 06/01/2020, Discharged on 06/04/2020  Component Date Value Ref Range Status   Rom Plus 06/01/2020 POSITIVE   Final   Performed at [facility], [address] [place], [place] [address]   RPR Ser Ql 06/01/2020 NON REACTIVE  NON REACTIVE Final   Performed at [facility] [address], [place], [place] [address]   WBC 06/01/2020 15.4* 4.0 - 10.5 K/uL Final   RBC 06/01/2020 3.69* 3.87 - 5.11 MIL/uL Final   Hemoglobin 06/01/2020 11.9* 12.0 - 15.0 g/dL Final   HCT 06/01/2020 33.9* 36 - 46 % Final   MCV 06/01/2020 91.9  80.0 - 100.0 fL Final   MCH 06/01/2020 32.2  26.0 - 34.0 pg Final   MCHC 06/01/2020 35.1  30.0 - 36.0 g/dL Final   RDW 06/01/2020 13.7  11.5 - 15.5 % Final   Platelets 06/01/2020 197  150 - 400 K/uL Final   nRBC 06/01/2020 0.0  0.0 - 0.2 % Final   Performed at [facility], [address] [place], [place] [address]   ABO/RH(D) 06/01/2020 O POS   Final   Antibody Screen 06/01/2020 NEG   Final   Sample Expiration 06/01/2020    Final                   Value:06/04/2020,2359 Performed at [facility], [address] [place], [place] [address]    SARS Coronavirus 2 06/01/2020 NEGATIVE  NEGATIVE Final   Comment: (NOTE) SARS-CoV-2 target nucleic acids are NOT DETECTED.  The SARS-CoV-2 RNA is generally  detectable in upper and lower respiratory specimens during the acute phase of infection. The lowest concentration of SARS-CoV-2 viral copies this assay can detect is 250 copies / mL. A negative result does not preclude SARS-CoV-2 infection and should not be used as the sole basis for treatment or other patient management decisions.  A negative result may occur with improper specimen collection / handling, submission of specimen other than nasopharyngeal swab, presence of viral mutation(s) within the areas targeted by this assay, and inadequate number of viral copies (<250 copies / mL). A negative result must be combined with clinical observations, patient history, and epidemiological information.  Fact Sheet for Patients:   StrictlyIdeas.no  Fact Sheet for Healthcare Providers: BankingDealers.co.za  This test is not yet approved or                           cleared by the [place] FDA and has been authorized for detection and/or diagnosis of SARS-CoV-2 by FDA under an Emergency Use Authorization (EUA).  This EUA will remain in effect (meaning this test can be used) for the duration of the COVID-19 declaration under Section 564(b)(1) of the Act, 21 U.S.C. section 360bbb-3(b)(1), unless the authorization is terminated or revoked sooner.  Performed at [facility], [address] [place], [place] [address]    WBC 06/03/2020 12.2* 4.0 -  10.5 K/uL Final   RBC 06/03/2020 2.57* 3.87 - 5.11 MIL/uL Final   Hemoglobin 06/03/2020 8.2* 12.0 - 15.0 g/dL Final   REPEATED TO VERIFY   HCT 06/03/2020 25.1* 36 - 46 % Final   MCV 06/03/2020 97.7  80.0 - 100.0 fL Final   MCH 06/03/2020 31.9  26.0 - 34.0 pg Final   MCHC 06/03/2020 32.7  30.0 - 36.0 g/dL Final   RDW 06/03/2020 14.1  11.5 - 15.5 % Final   Platelets 06/03/2020 126* 150 - 400 K/uL Final   nRBC 06/03/2020 0.0  0.0 - 0.2 % Final   Performed at [facility], [address] [place], [place] [address]   WBC 06/04/2020 9.1  4.0 - 10.5 K/uL Final   RBC 06/04/2020 2.50* 3.87 - 5.11 MIL/uL Final   Hemoglobin 06/04/2020 8.3* 12.0 - 15.0 g/dL Final   HCT 06/04/2020 24.1* 36 - 46 % Final   MCV 06/04/2020 96.4  80.0 - 100.0 fL Final   MCH 06/04/2020 33.2  26.0 - 34.0 pg Final   MCHC 06/04/2020 34.4  30.0 - 36.0 g/dL Final   RDW 06/04/2020 14.4  11.5 - 15.5 % Final   Platelets 06/04/2020 152  150 - 400 K/uL Final   nRBC 06/04/2020 0.0  0.0 - 0.2 % Final   Performed at [facility], [address] [place], [place] [address]    Assessment: 29 y.o. s/p RLTCS stable  Plan: Patient has done well after surgery with no apparent complications.  I have discussed the post-operative course to date, and the expected progress moving forward.  The patient understands what complications to be concerned about.  I will see the patient in routine follow up, or sooner if needed.    Activity plan: No restriction.  ON-Q removed   Return in about 1 week (around 06/12/2020) for postop.    [name], MD, [facility], [facility] 06/05/2020, 1:38 PM

## 2020-06-06 ENCOUNTER — Encounter: Payer: BC Managed Care – PPO | Admitting: Obstetrics and Gynecology

## 2020-06-12 ENCOUNTER — Encounter: Payer: Self-pay | Admitting: Obstetrics and Gynecology

## 2020-06-12 ENCOUNTER — Other Ambulatory Visit: Payer: Self-pay

## 2020-06-12 ENCOUNTER — Ambulatory Visit (INDEPENDENT_AMBULATORY_CARE_PROVIDER_SITE_OTHER): Payer: BC Managed Care – PPO | Admitting: Obstetrics and Gynecology

## 2020-06-12 VITALS — BP 138/90 | HR 64 | Wt 176.0 lb

## 2020-06-12 DIAGNOSIS — R300 Dysuria: Secondary | ICD-10-CM

## 2020-06-12 MED ORDER — NITROFURANTOIN MONOHYD MACRO 100 MG PO CAPS
100.0000 mg | ORAL_CAPSULE | Freq: Two times a day (BID) | ORAL | 0 refills | Status: AC
Start: 2020-06-12 — End: 2020-06-17

## 2020-06-12 NOTE — Progress Notes (Signed)
Postoperative Follow-up Patient presents post op from RLTCS 1weeks ago for 2nd stage arrest.  Subjective: Patient reports some improvement in her preop symptoms. Eating a regular diet without difficulty. Pain is controlled without any medications.  Activity: normal activities of daily living. Does report dysuria.  Objective: Blood pressure 138/90, pulse 64, weight 176 lb (79.8 kg), not currently breastfeeding.  General: NAD Pulmonary: no increased work of breathing Abdomen: soft, non-tender, non-distended, incision D/C/I Extremities: no edema Neurologic: normal gait   Admission on 06/01/2020, Discharged on 06/04/2020  Component Date Value Ref Range Status  . Rom Plus 06/01/2020 POSITIVE   Final   Performed at [facility], [address] [place], [place] [address]  . RPR Ser Ql 06/01/2020 NON REACTIVE  NON REACTIVE Final   Performed at [facility], [place], [place], [place] [address]  . WBC 06/01/2020 15.4* 4.0 - 10.5 K/uL Final  . RBC 06/01/2020 3.69* 3.87 - 5.11 MIL/uL Final  . Hemoglobin 06/01/2020 11.9* 12.0 - 15.0 g/dL Final  . HCT 06/01/2020 33.9* 36 - 46 % Final  . MCV 06/01/2020 91.9  80.0 - 100.0 fL Final  . MCH 06/01/2020 32.2  26.0 - 34.0 pg Final  . MCHC 06/01/2020 35.1  30.0 - 36.0 g/dL Final  . RDW 06/01/2020 13.7  11.5 - 15.5 % Final  . Platelets 06/01/2020 197  150 - 400 K/uL Final  . nRBC 06/01/2020 0.0  0.0 - 0.2 % Final   Performed at [facility], [address], [place], [place] [address]  . ABO/RH(D) 06/01/2020 O POS   Final  . Antibody Screen 06/01/2020 NEG   Final  . Sample Expiration 06/01/2020    Final                   Value:06/04/2020,2359 Performed at [facility], [address], [place], [place] [address]   . SARS Coronavirus 2 06/01/2020 NEGATIVE  NEGATIVE Final   Comment: (NOTE) SARS-CoV-2 target nucleic acids are NOT DETECTED.  The SARS-CoV-2 RNA is generally detectable in upper and  lower respiratory specimens during the acute phase of infection. The lowest concentration of SARS-CoV-2 viral copies this assay can detect is 250 copies / mL. A negative result does not preclude SARS-CoV-2 infection and should not be used as the sole basis for treatment or other patient management decisions.  A negative result may occur with improper specimen collection / handling, submission of specimen other than nasopharyngeal swab, presence of viral mutation(s) within the areas targeted by this assay, and inadequate number of viral copies (<250 copies / mL). A negative result must be combined with clinical observations, patient history, and epidemiological information.  Fact Sheet for Patients:   StrictlyIdeas.no  Fact Sheet for Healthcare Providers: BankingDealers.co.za  This test is not yet approved or                           cleared by the [place] FDA and has been authorized for detection and/or diagnosis of SARS-CoV-2 by FDA under an Emergency Use Authorization (EUA).  This EUA will remain in effect (meaning this test can be used) for the duration of the COVID-19 declaration under Section 564(b)(1) of the Act, 21 U.S.C. section 360bbb-3(b)(1), unless the authorization is terminated or revoked sooner.  Performed at [facility], [address] [place], [place] [address]   . WBC 06/03/2020 12.2* 4.0 - 10.5 K/uL Final  .  RBC 06/03/2020 2.57* 3.87 - 5.11 MIL/uL Final  . Hemoglobin 06/03/2020 8.2* 12.0 - 15.0 g/dL Final   REPEATED TO VERIFY  . HCT 06/03/2020 25.1* 36 - 46 % Final  . MCV 06/03/2020 97.7  80.0 - 100.0 fL Final  . MCH 06/03/2020 31.9  26.0 - 34.0 pg Final  . MCHC 06/03/2020 32.7  30.0 - 36.0 g/dL Final  . RDW 06/03/2020 14.1  11.5 - 15.5 % Final  . Platelets 06/03/2020 126* 150 - 400 K/uL Final  . nRBC 06/03/2020 0.0  0.0 - 0.2 % Final   Performed at [facility], [address]  [place], [place] [address]  . WBC 06/04/2020 9.1  4.0 - 10.5 K/uL Final  . RBC 06/04/2020 2.50* 3.87 - 5.11 MIL/uL Final  . Hemoglobin 06/04/2020 8.3* 12.0 - 15.0 g/dL Final  . HCT 06/04/2020 24.1* 36 - 46 % Final  . MCV 06/04/2020 96.4  80.0 - 100.0 fL Final  . MCH 06/04/2020 33.2  26.0 - 34.0 pg Final  . MCHC 06/04/2020 34.4  30.0 - 36.0 g/dL Final  . RDW 06/04/2020 14.4  11.5 - 15.5 % Final  . Platelets 06/04/2020 152  150 - 400 K/uL Final  . nRBC 06/04/2020 0.0  0.0 - 0.2 % Final   Performed at [facility], [address] [place], [place] [address]    Assessment: 29 y.o. s/p RLTCS  stable  Plan: Patient has done well after surgery with no apparent complications.  I have discussed the post-operative course to date, and the expected progress moving forward.  The patient understands what complications to be concerned about.  I will see the patient in routine follow up, or sooner if needed.    Activity plan: No heavy lifting.  Dysuria - Ucx sent and Macrobid Rx  Considering BTL vs Vasectomy, was on continuous OCPs for endometriosis   [name], MD, [facility] 06/12/2020, 2:35 PM

## 2020-06-14 LAB — URINE CULTURE

## 2020-06-18 ENCOUNTER — Other Ambulatory Visit: Payer: Self-pay | Admitting: Obstetrics and Gynecology

## 2020-06-18 NOTE — Telephone Encounter (Signed)
Is refill appropriate  

## 2020-07-13 ENCOUNTER — Ambulatory Visit (INDEPENDENT_AMBULATORY_CARE_PROVIDER_SITE_OTHER): Payer: BC Managed Care – PPO | Admitting: Obstetrics and Gynecology

## 2020-07-13 ENCOUNTER — Encounter: Payer: Self-pay | Admitting: Obstetrics and Gynecology

## 2020-07-13 ENCOUNTER — Other Ambulatory Visit: Payer: Self-pay

## 2020-07-13 DIAGNOSIS — Z30011 Encounter for initial prescription of contraceptive pills: Secondary | ICD-10-CM

## 2020-07-13 MED ORDER — LEVONORGEST-ETH ESTRAD 91-DAY 0.15-0.03 &0.01 MG PO TABS: 1.0000 | ORAL_TABLET | Freq: Every day | ORAL | 3 refills | Status: DC

## 2020-07-13 NOTE — Progress Notes (Signed)
Postpartum Visit  Chief Complaint:  Chief Complaint  Patient presents with  . Postpartum Care    C/S 8/7    History of Present Illness: Patient is a 29 y.o. [id number] presents for postpartum visit.  Date of delivery: 06/02/2020 Cesarean Section: Second stage arrest Pregnancy or labor problems:  no Any problems since the delivery:  no  Newborn Details:  SINGLETON :  1. BabyGender female. Birth weight: 7lbs 7oz Maternal Details:  Breast or formula feeding: plans to bottle feed Intercourse: No  Contraception after delivery: No  Any bowel or bladder issues: No  Post partum depression/anxiety noted:  no Edinburgh Post-Partum Depression Score:0 Date of last PAP: 1/5/20201  no abnormalities   Review of Systems: Review of Systems  Constitutional: Negative.   Gastrointestinal: Negative.   Genitourinary: Negative.   Psychiatric/Behavioral: Negative.     The following portions of the patient's history were reviewed and updated as appropriate: allergies, current medications, past family history, past medical history, past social history, past surgical history and problem list.  Past Medical History:  Past Medical History:  Diagnosis Date  . Anemia   . Eczema   . Eczema   . Endometriosis   . History of ovarian cyst   . Melanoma ([place])    invasive right upper back   . Migraine    On continuous BC for prevention  . Prediabetes     Past Surgical History:  Past Surgical History:  Procedure Laterality Date  . ABLATION ON ENDOMETRIOSIS  2012  . CESAREAN SECTION N/A 04/12/2017   Procedure: CESAREAN SECTION;  Surgeon: Gae Dry, MD;  Location: [facility] ORS;  Service: Obstetrics;  Laterality: N/A;  . CESAREAN SECTION N/A 06/02/2020   Procedure: CESAREAN SECTION;  Surgeon: [name], MD;  Location: [facility] ORS;  Service: Obstetrics;  Laterality: N/A;  . LAPAROSCOPIC OVARIAN CYSTECTOMY Right 2012    Family History:  Family History  Problem Relation Age of Onset  .  Hypertension Mother   . Birth defects Mother        Congential heart condition   . Endometriosis Mother   . Diabetes Mother   . Other Mother        endometrosis  . Cancer Father        Prostate  . Diabetes Paternal Grandfather   . Stroke Paternal Grandfather     Social History:  Social History   Socioeconomic History  . Marital status: Married    Spouse name: Brodie Scovell - Fiance  . Number of children: Not on file  . Years of education: 67  . Highest education level: Not on file  Occupational History  . 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 Smoking status: Never Smoker  . Smokeless tobacco: Never Used  Vaping Use  . Vaping Use: Never used  Substance and Sexual Activity  . Alcohol use: No    Alcohol/week: 0.0 standard drinks  . Drug use: No  . Sexual activity: Not Currently    Partners: Male    Birth control/protection: None  Other Topics Concern  . Not on file  Social History Narrative   Ladaija was born and reared in [place], [place]. She graduated from [employer] in 2008 and then went to [employer] and obtained her Bachelors in [employer] in 2012. Married with 1 son had in 2018. 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 She enjoys photography, hanging out with friends. She enjoys action movies.      Caffeine- Coffee 1 cup, 1 small can of soda   Social Determinants of Health   Financial Resource Strain:   . Difficulty of Paying Living Expenses: Not on file  Food Insecurity:   . Worried About Charity fundraiser in the Last Year: Not on file  . Ran Out of Food in the Last Year: Not on file  Transportation Needs:   . Lack of Transportation (Medical): Not on file  . Lack of Transportation (Non-Medical): Not on file  Physical Activity:   . Days of Exercise per Week: Not on file  . Minutes of Exercise per Session: Not on file  Stress:   .  Feeling of Stress : Not on file  Social Connections:   . Frequency of Communication with Friends and Family: Not on file  . Frequency of Social Gatherings with Friends and Family: Not on file  . Attends Religious Services: Not on file  . Active Member of Clubs or Organizations: Not on file  . Attends Archivist Meetings: Not on file  . Marital Status: Not on file  Intimate Partner Violence:   . Fear of Current or Ex-Partner: Not on file  . Emotionally Abused: Not on file  . Physically Abused: Not on file  . Sexually Abused: Not on file    Allergies:  No Known Allergies  Medications: Prior to Admission medications   Medication Sig Start Date End Date Taking? Authorizing Provider  ferrous sulfate 325 (65 FE) MG tablet TAKE 1 TABLET BY MOUTH EVERY DAY WITH BREAKFAST 06/18/20   [name], MD  oxyCODONE-acetaminophen (PERCOCET) 10-325 MG tablet Take 1 tablet by mouth every 4 (four) hours as needed for pain.    [provider]  Prenatal Vit-Fe Fumarate-FA (MULTIVITAMIN-PRENATAL) 27-0.8 MG TABS tablet Take 1 tablet by mouth daily at 12 noon.    [provider]    Physical Exam Vitals: Blood pressure 112/74, height 5\' 1"  (1.549 m), weight 174 lb (78.9 kg), not currently breastfeeding. Body mass index is 32.88 kg/m.  General: NAD HEENT: normocephalic, anicteric Pulmonary: No increased work of breathing Abdomen: NABS, soft, non-tender, non-distended.  Umbilicus without lesions.  No hepatomegaly, splenomegaly or masses palpable. No evidence of hernia. Incision D/C/I Genitourinary:  External: Normal external female genitalia.  Normal urethral meatus, normal Bartholin's and Skene's glands.    Vagina: Normal vaginal mucosa, no evidence of prolapse.    Cervix: Grossly normal in appearance, no bleeding  Uterus: Non-enlarged, mobile, normal contour.  No CMT  Adnexa: ovaries non-enlarged, no adnexal masses  Rectal: deferred Extremities: no edema, erythema,  or tenderness Neurologic: Grossly intact Psychiatric: mood appropriate, affect full   Edinburgh Postnatal Depression Scale - 07/13/20 1613      Edinburgh Postnatal Depression Scale:  In the Past 7 Days   I have been able to laugh and see the funny side of things. 0    I have looked forward with enjoyment to things. 0    I have blamed myself unnecessarily when things went wrong. 0    I have been anxious or worried for no good reason. 0    I have felt scared or panicky for no good reason. 0    Things have been getting on top of me. 0    I have been so unhappy that I have had difficulty sleeping. 0    I have felt sad or miserable. 0  I have been so unhappy that I have been crying. 0    The thought of harming myself has occurred to me. 0    Edinburgh Postnatal Depression Scale Total 0           Immunization History  Administered Date(s) Administered  . Influenza,inj,Quad PF,6+ Mos 11/01/2019  . Influenza-Unspecified 12/21/2018  . Tdap 01/27/2017, 04/05/2020     Assessment: 29 y.o. [id number] presenting for 6 week postpartum visit  Plan: Problem List Items Addressed This Visit    None    Visit Diagnoses    6 weeks postpartum follow-up    -  Primary   Initiation of oral contraception          1) Contraception - Education given regarding options for contraception, as well as compatibility with breast feeding if applicable.  Patient plans on OCP (estrogen/progesterone) for contraception.  2)  Pap - ASCCP guidelines and rational discussed.  ASCCP guidelines and rational discussed.  Patient opts for every 3 years screening interval  3) Patient underwent screening for postpartum depression with no signs of depression  4) Return in about 1 year (around 07/13/2021) for annual.   [name], MD, [place], [facility] 07/13/2020, 4:34 PM

## 2021-07-17 ENCOUNTER — Other Ambulatory Visit: Payer: Self-pay | Admitting: Obstetrics and Gynecology

## 2021-09-24 ENCOUNTER — Encounter: Payer: Self-pay | Admitting: Adult Health

## 2021-09-24 NOTE — Progress Notes (Signed)
Virtual Visit via Video Note  I connected with Heather Conner on 09/25/21 at  3:00 PM EST by a video enabled telemedicine application and verified that I am speaking with the correct person using two identifiers.  Location: Patient: at home  Provider: Provider: Provider's office at  [facility], [place].      I discussed the limitations of evaluation and management by telemedicine and the availability of in person appointments. The patient expressed understanding and agreed to proceed. I discussed the assessment and treatment plan with the patient. The patient was provided an opportunity to ask questions and all were answered. The patient agreed with the plan and demonstrated an understanding of the instructions.   The patient was advised to call back or seek an in-person evaluation if the symptoms worsen or if the condition fails to improve as anticipated.  I provided 20 minutes of non-face-to-face time during this encounter.   Heather Buffy, FNP   Subjective:    Patient ID: Heather Conner, female    DOB: 07-Jan-1991, 30 y.o.   MRN: [id number]  Chief Complaint  Patient presents with   URI   Sinusitis    URI  This is a new problem. The current episode started in the past 7 days (09/20/21 onset). The problem has been gradually worsening. There has been no fever. Associated symptoms include congestion, ear pain (right ear pain), headaches and sinus pain (right sided only and hurts to touch.). Pertinent negatives include no abdominal pain, chest pain, coughing, diarrhea, dysuria, joint pain, joint swelling, nausea, neck pain, plugged ear sensation, rash, rhinorrhea, sneezing, sore throat, swollen glands, vomiting or wheezing. She has tried decongestant and acetaminophen (netti pot 3-4 times) for the symptoms. The treatment provided mild relief.   Dental pain generalized.   No LMP recorded.she is on continuous OCP and denies any chance of  pregnancy.   Patient  denies any fever, body aches,chills, rash, chest pain, shortness of breath, nausea, vomiting, or diarrhea.     Past Medical History:  Diagnosis Date   Anemia    Eczema    Eczema    Endometriosis    History of ovarian cyst    Melanoma ([place])    invasive right upper back    Migraine    On continuous BC for prevention   Prediabetes     Past Surgical History:  Procedure Laterality Date   ABLATION ON ENDOMETRIOSIS  2012   CESAREAN SECTION N/A 04/12/2017   Procedure: CESAREAN SECTION;  Surgeon: Heather Dry, MD;  Location: [facility] ORS;  Service: Obstetrics;  Laterality: N/A;   CESAREAN SECTION N/A 06/02/2020   Procedure: CESAREAN SECTION;  Surgeon: Heather Mood, MD;  Location: [facility] ORS;  Service: Obstetrics;  Laterality: N/A;   LAPAROSCOPIC OVARIAN CYSTECTOMY Right 2012    Family History  Problem Relation Age of Onset   Hypertension Mother    Birth defects Mother        Congential heart condition    Endometriosis Mother    Diabetes Mother    Other Mother        endometrosis   Cancer Father        Prostate   Diabetes Paternal Grandfather    Stroke Paternal Grandfather     Social History   Socioeconomic History   Marital status: Married    Spouse name: Heather Conner - Fiance   Number of children: Not on file   Years of education: 16   Highest  education level: Not on file  Occupational History   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 She graduated from [employer] in  2008 and then went to [employer] and obtained her Bachelors in [employer] in 2012. Married with 1 son had in 2018. 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 She enjoys photography, hanging out with friends. She enjoys action movies.      Caffeine- Coffee 1 cup, 1 small can of soda   Social Determinants of Health   Financial Resource Strain: Not on file  Food Insecurity: Not on file  Transportation Needs: Not on file  Physical Activity: Not on file  Stress: Not on file  Social Connections: Not on file  Intimate Partner Violence: Not on file    Outpatient Medications Prior to Visit  Medication Sig Dispense Refill   ferrous sulfate 325 (65 FE) MG tablet TAKE 1 TABLET BY MOUTH EVERY DAY WITH BREAKFAST 90 tablet 3   Levonorgestrel-Ethinyl Estradiol (AMETHIA) 0.15-0.03 &0.01 MG tablet TAKE 1 TABLET BY MOUTH EVERY DAY 91 tablet 0   Prenatal Vit-Fe Fumarate-FA (MULTIVITAMIN-PRENATAL) 27-0.8 MG TABS tablet Take 1 tablet by mouth daily at 12 noon.     oxyCODONE-acetaminophen (PERCOCET) 10-325 MG tablet Take 1 tablet by mouth every 4 (four) hours as needed for pain.     No facility-administered medications prior to visit.    No Known Allergies  Review of Systems  HENT:  Positive for congestion, ear pain (right ear pain) and sinus pain (right sided only and hurts to touch.). Negative for rhinorrhea, sneezing and sore throat.   Respiratory:  Negative for cough and wheezing.   Cardiovascular:  Negative for chest pain.  Gastrointestinal:  Negative for abdominal pain, diarrhea, nausea and vomiting.  Genitourinary:  Negative for dysuria.  Musculoskeletal:  Negative for joint pain and neck pain.  Skin:  Negative for rash.  Neurological:  Positive for headaches.      Objective:    Physical Exam   Patient is alert and oriented and responsive to questions Engages in conversation with provider. Speaks in full sentences without any pauses without any shortness of  breath or distress.  No vital signs available for video visit.  Patient is in no acute distress Ht 5\' 1"  (1.549 m)   Wt 180 lb (81.6 kg)   BMI 34.01 kg/m  Wt Readings from Last 3 Encounters:  09/25/21 180 lb (81.6 kg)  07/13/20 174 lb (78.9 kg)  06/12/20 176 lb (79.8 kg)    Health Maintenance Due  Topic Date Due   COVID-19 Vaccine (1) Never done   Pneumococcal Vaccine 33-70 Years old (1 - PCV) Never done   Hepatitis C Screening  Never done   INFLUENZA VACCINE  05/27/2021    There are no preventive care reminders to display for this patient.   Lab Results  Component Value Date   TSH 1.94 04/11/2019   Lab Results  Component Value Date   WBC 9.1 06/04/2020   HGB 8.3 (L) 06/04/2020   HCT 24.1 (L) 06/04/2020   MCV 96.4 06/04/2020   PLT 152 06/04/2020   Lab Results  Component Value Date   NA 137 04/11/2019   K 4.2 04/11/2019   CO2 25 04/11/2019   GLUCOSE 84 04/11/2019   BUN 16 04/11/2019   CREATININE 0.93 04/11/2019   BILITOT 0.4 04/11/2019   ALKPHOS 93 04/11/2019   AST 16 04/11/2019   ALT 19 04/11/2019   PROT 7.2 04/11/2019   ALBUMIN 4.0 04/11/2019   CALCIUM 9.4 04/11/2019   ANIONGAP 7 10/27/2017   GFR 70.77 04/11/2019   Lab Results  Component Value Date   CHOL 189 04/11/2019   Lab Results  Component Value Date   HDL 37.20 (L) 04/11/2019   Lab Results  Component Value Date   LDLCALC 120 (H) 04/11/2019   Lab Results  Component Value Date   TRIG 155.0 (H) 04/11/2019   Lab Results  Component Value Date   CHOLHDL 5 04/11/2019   Lab Results  Component Value Date   HGBA1C 5.9 04/11/2019       Assessment & Plan:   Problem List Items Addressed This Visit   None Visit Diagnoses     Acute non-recurrent pansinusitis    -  Primary   Relevant Medications   amoxicillin-clavulanate (AUGMENTIN) 875-125 MG tablet   predniSONE (STERAPRED UNI-PAK 21 TAB) 10 MG (21) TBPK tablet        Meds ordered this encounter  Medications    amoxicillin-clavulanate (AUGMENTIN) 875-125 MG tablet    Sig: Take 1 tablet by mouth 2 (two) times daily.    Dispense:  20 tablet    Refill:  0   predniSONE (STERAPRED UNI-PAK 21 TAB) 10 MG (21) TBPK tablet    Sig: PO: Take 6 tablets on day 1:Take 5 tablets day 2:Take 4 tablets day 3: Take 3 tablets day 4:Take 2 tablets day five: 5 Take 1 tablet day 6    Dispense:  21 tablet    Refill:  0   No orders of the defined types were placed in this encounter.  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 Red Flags discussed. The patient was given clear instructions to go to ER or return to medical center if any red flags develop, symptoms do not improve, worsen or new problems develop. They verbalized understanding.  Return if symptoms worsen or fail to improve, for at any time for any worsening symptoms, Go to Emergency room/ urgent care if worse.   Heather Buffy, FNP

## 2021-09-25 ENCOUNTER — Telehealth (INDEPENDENT_AMBULATORY_CARE_PROVIDER_SITE_OTHER): Payer: BC Managed Care – PPO | Admitting: Adult Health

## 2021-09-25 ENCOUNTER — Other Ambulatory Visit: Payer: Self-pay

## 2021-09-25 VITALS — Ht 61.0 in | Wt 180.0 lb

## 2021-09-25 DIAGNOSIS — J0140 Acute pansinusitis, unspecified: Secondary | ICD-10-CM

## 2021-09-25 MED ORDER — PREDNISONE 10 MG (21) PO TBPK: ORAL_TABLET | ORAL | 0 refills | Status: DC

## 2021-09-25 MED ORDER — AMOXICILLIN-POT CLAVULANATE 875-125 MG PO TABS: 1.0000 | ORAL_TABLET | Freq: Two times a day (BID) | ORAL | 0 refills | Status: DC

## 2021-09-25 NOTE — Patient Instructions (Signed)
Sinusitis, Adult Sinusitis is inflammation of your sinuses. Sinuses are hollow spaces in the bones around your face. Your sinuses are located: Around your eyes. In the middle of your forehead. Behind your nose. In your cheekbones. Mucus normally drains out of your sinuses. When your nasal tissues become inflamed or swollen, mucus can become trapped or blocked. This allows bacteria, viruses, and fungi to grow, which leads to infection. Most infections of the sinuses are caused by a virus. Sinusitis can develop quickly. It can last for up to 4 weeks (acute) or for more than 12 weeks (chronic). Sinusitis often develops after a cold. What are the causes? This condition is caused by anything that creates swelling in the sinuses or stops mucus from draining. This includes: Allergies. Asthma. Infection from bacteria or viruses. Deformities or blockages in your nose or sinuses. Abnormal growths in the nose (nasal polyps). Pollutants, such as chemicals or irritants in the air. Infection from fungi (rare). What increases the risk? You are more likely to develop this condition if you: Have a weak body defense system (immune system). Do a lot of swimming or diving. Overuse nasal sprays. Smoke. What are the signs or symptoms? The main symptoms of this condition are pain and a feeling of pressure around the affected sinuses. Other symptoms include: Stuffy nose or congestion. Thick drainage from your nose. Swelling and warmth over the affected sinuses. Headache. Upper toothache. A cough that may get worse at night. Extra mucus that collects in the throat or the back of the nose (postnasal drip). Decreased sense of smell and taste. Fatigue. A fever. Sore throat. Bad breath. How is this diagnosed? This condition is diagnosed based on: Your symptoms. Your medical history. A physical exam. Tests to find out if your condition is acute or chronic. This may include: Checking your nose for nasal  polyps. Viewing your sinuses using a device that has a light (endoscope). Testing for allergies or bacteria. Imaging tests, such as an MRI or CT scan. In rare cases, a bone biopsy may be done to rule out more serious types of fungal sinus disease. How is this treated? Treatment for sinusitis depends on the cause and whether your condition is chronic or acute. If caused by a virus, your symptoms should go away on their own within 10 days. You may be given medicines to relieve symptoms. They include: Medicines that shrink swollen nasal passages (topical intranasal decongestants). Medicines that treat allergies (antihistamines). A spray that eases inflammation of the nostrils (topical intranasal corticosteroids). Rinses that help get rid of thick mucus in your nose (nasal saline washes). If caused by bacteria, your health care provider may recommend waiting to see if your symptoms improve. Most bacterial infections will get better without antibiotic medicine. You may be given antibiotics if you have: A severe infection. A weak immune system. If caused by narrow nasal passages or nasal polyps, you may need to have surgery. Follow these instructions at home: Medicines Take, use, or apply over-the-counter and prescription medicines only as told by your health care provider. These may include nasal sprays. If you were prescribed an antibiotic medicine, take it as told by your health care provider. Do not stop taking the antibiotic even if you start to feel better. Hydrate and humidify  Drink enough fluid to keep your urine pale yellow. Staying hydrated will help to thin your mucus. Use a cool mist humidifier to keep the humidity level in your home above 50%. Inhale steam for 10-15 minutes, 3-4 times  a day, or as told by your health care provider. You can do this in the bathroom while a hot shower is running. Limit your exposure to cool or dry air. Rest Rest as much as possible. Sleep with your  head raised (elevated). Make sure you get enough sleep each night. General instructions  Apply a warm, moist washcloth to your face 3-4 times a day or as told by your health care provider. This will help with discomfort. Wash your hands often with soap and water to reduce your exposure to germs. If soap and water are not available, use hand sanitizer. Do not smoke. Avoid being around people who are smoking (secondhand smoke). Keep all follow-up visits as told by your health care provider. This is important. Contact a health care provider if: You have a fever. Your symptoms get worse. Your symptoms do not improve within 10 days. Get help right away if: You have a severe headache. You have persistent vomiting. You have severe pain or swelling around your face or eyes. You have vision problems. You develop confusion. Your neck is stiff. You have trouble breathing. Summary Sinusitis is soreness and inflammation of your sinuses. Sinuses are hollow spaces in the bones around your face. This condition is caused by nasal tissues that become inflamed or swollen. The swelling traps or blocks the flow of mucus. This allows bacteria, viruses, and fungi to grow, which leads to infection. If you were prescribed an antibiotic medicine, take it as told by your health care provider. Do not stop taking the antibiotic even if you start to feel better. Keep all follow-up visits as told by your health care provider. This is important. This information is not intended to replace advice given to you by your health care provider. Make sure you discuss any questions you have with your health care provider. Document Revised: 03/15/2018 Document Reviewed: 03/15/2018 Elsevier Patient Education  [place]. Prednisolone Tablets What is this medication? PREDNISOLONE (pred NISS oh lone) treats many conditions such as asthma, allergic reactions, arthritis, inflammatory bowel diseases, adrenal, and blood or  bone marrow disorders. It works by decreasing inflammation, slowing down an overactive immune system, or replacing cortisol normally made in the body. Cortisol is a hormone that plays an important role in how the body responds to stress, illness, and injury. It belongs to a group of medications called steroids. This medicine may be used for other purposes; ask your health care provider or pharmacist if you have questions. COMMON BRAND NAME(S): Millipred, Millipred DP, Millipred DP 12-Day, Millipred DP 6 Day, Prednoral What should I tell my care team before I take this medication? They need to know if you have any of these conditions: Cushing's syndrome Diabetes Glaucoma Heart problems or disease High blood pressure Infection such as herpes, measles, tuberculosis, or chickenpox Kidney disease Liver disease Mental problems Myasthenia gravis Osteoporosis Seizures Stomach ulcer or intestine disease including colitis and diverticulitis Thyroid problem An unusual or allergic reaction to lactose, prednisolone, other medications, foods, dyes, or preservatives Pregnant or trying to get pregnant Breast-feeding How should I use this medication? Take this medication by mouth with a glass of water. Follow the directions on the prescription label. Take it with food or milk to avoid stomach upset. If you are taking this medication once a day, take it in the morning. Do not take more medication than you are told to take. Do not suddenly stop taking your medication because you may develop a severe reaction. Your care team will  tell you how much medication to take. If your care team wants you to stop the medication, the dose may be slowly lowered over time to avoid any side effects. Talk to your care team about the use of this medication in children. Special care may be needed. Overdosage: If you think you have taken too much of this medicine contact a poison control center or emergency room at once. NOTE:  This medicine is only for you. Do not share this medicine with others. What if I miss a dose? If you miss a dose, take it as soon as you can. If it is almost time for your next dose, take only that dose. Do not take double or extra doses. What may interact with this medication? Do not take this medication with any of the following: Metyrapone Mifepristone This medication may also interact with the following: Aminoglutethimide Amphotericin B Aspirin and aspirin-like medications Barbiturates Certain medications for diabetes, like glipizide or glyburide Cholestyramine Cholinesterase inhibitors Cyclosporine Digoxin Diuretics Ephedrine Female hormones, like estrogens and birth control pills Isoniazid Ketoconazole NSAIDS, medications for pain and inflammation, like ibuprofen or naproxen Phenytoin Rifampin Toxoids Vaccines Warfarin This list may not describe all possible interactions. Give your health care provider a list of all the medicines, herbs, non-prescription drugs, or dietary supplements you use. Also tell them if you smoke, drink alcohol, or use illegal drugs. Some items may interact with your medicine. What should I watch for while using this medication? Visit your care team for regular checks on your progress. If you are taking this medication over a prolonged period, carry an identification card with your name and address, the type and dose of your medication, and your care team's name and address. This medication may increase your risk of getting an infection. Tell your care team if you are around anyone with measles or chickenpox, or if you develop sores or blisters that do not heal properly. If you are going to have surgery, tell your care team that you have taken this medication within the last twelve months. Ask your care team about your diet. You may need to lower the amount of salt you eat. This medication may increase blood sugar. Ask your care team if changes in diet  or medications are needed if you have diabetes. What side effects may I notice from receiving this medication? Side effects that you should report to your care team as soon as possible: Allergic reactions--skin rash, itching, hives, swelling of the face, lips, tongue, or throat Cushing syndrome--increased fat around the midsection, upper back, neck, or face, pink or purple stretch marks on the skin, thinning, fragile skin that easily bruises, unexpected hair growth High blood sugar (hyperglycemia)--increased thirst or amount of urine, unusual weakness or fatigue, blurry vision Increase in blood pressure Infection--fever, chills, cough, sore throat, wounds that don't heal, pain or trouble when passing urine, general feeling of discomfort or being unwell Low adrenal gland function--nausea, vomiting, loss of appetite, unusual weakness or fatigue, dizziness Mood and behavior changes--anxiety, nervousness, confusion, hallucinations, irritability, hostility, thoughts of suicide or self-harm, worsening mood, feelings of depression Stomach bleeding--bloody or black, tar-like stools, vomiting blood or brown material that looks like coffee grounds Swelling of the ankles, hands, or feet Side effects that usually do not require medical attention (report to your care team if they continue or are bothersome): Acne General discomfort and fatigue Headache Increase in appetite Nausea Trouble sleeping Weight gain This list may not describe all possible side effects. Call your doctor  for medical advice about side effects. You may report side effects to FDA at 1-800-FDA-1088. Where should I keep my medication? Keep out of the reach of children. Store at room temperature between 15 and 30 degrees C (59 and 86 degrees F). Keep container tightly closed. Throw away any unused medication after the expiration date. NOTE: This sheet is a summary. It may not cover all possible information. If you have questions about  this medicine, talk to your doctor, pharmacist, or health care provider.  2022 Elsevier/Gold Standard (2021-01-11 00:00:00) Amoxicillin; Clavulanic Acid Tablets What is this medication? AMOXICILLIN; CLAVULANIC ACID (a mox i SIL in; KLAV yoo lan ic AS id) treats infections caused by bacteria. It belongs to a group of medications called penicillin antibiotics. It will not treat colds, the flu, or infections caused by viruses. This medicine may be used for other purposes; ask your health care provider or pharmacist if you have questions. COMMON BRAND NAME(S): Augmentin What should I tell my care team before I take this medication? They need to know if you have any of these conditions: Kidney disease Liver disease Mononucleosis Stomach or intestine problems such as colitis An unusual or allergic reaction to amoxicillin, other penicillin or cephalosporin antibiotics, clavulanic acid, other medications, foods, dyes, or preservatives Pregnant or trying to get pregnant Breast-feeding How should I use this medication? Take this medication by mouth. Take it as directed on the prescription label at the same time every day. Take it with food at the start of a meal or snack. Take all of this medication unless your care team tells you to stop it early. Keep taking it even if you think you are better. Talk to your care team about the use of this medication in children. While it may be prescribed for selected conditions, precautions do apply. Overdosage: If you think you have taken too much of this medicine contact a poison control center or emergency room at once. NOTE: This medicine is only for you. Do not share this medicine with others. What if I miss a dose? If you miss a dose, take it as soon as you can. If it is almost time for your next dose, take only that dose. Do not take double or extra doses. What may interact with this medication? Allopurinol Anticoagulants Birth control  pills Methotrexate Probenecid This list may not describe all possible interactions. Give your health care provider a list of all the medicines, herbs, non-prescription drugs, or dietary supplements you use. Also tell them if you smoke, drink alcohol, or use illegal drugs. Some items may interact with your medicine. What should I watch for while using this medication? Tell your care team if your symptoms do not start to get better or if they get worse. This medication may cause serious skin reactions. They can happen weeks to months after starting the medication. Contact your care team right away if you notice fevers or flu-like symptoms with a rash. The rash may be red or purple and then turn into blisters or peeling of the skin. Or, you might notice a red rash with swelling of the face, lips or lymph nodes in your neck or under your arms. Do not treat diarrhea with over the counter products. Contact your care team if you have diarrhea that lasts more than 2 days or if it is severe and watery. If you have diabetes, you may get a false-positive result for sugar in your urine. Check with your care team. Birth control may not  work properly while you are taking this medication. Talk to your care team about using an extra method of birth control. What side effects may I notice from receiving this medication? Side effects that you should report to your care team as soon as possible: Allergic reactions--skin rash, itching, hives, swelling of the face, lips, tongue, or throat Liver injury--right upper belly pain, loss of appetite, nausea, light-colored stool, dark yellow or brown urine, yellowing skin or eyes, unusual weakness or fatigue Redness, blistering, peeling, or loosening of the skin, including inside the mouth Severe diarrhea, fever Unusual vaginal discharge, itching, or odor Side effects that usually do not require medical attention (report to your care team if they continue or are  bothersome): Diarrhea Nausea Vomiting This list may not describe all possible side effects. Call your doctor for medical advice about side effects. You may report side effects to FDA at 1-800-FDA-1088. Where should I keep my medication? Keep out of the reach of children and pets. Store at room temperature between 20 and 25 degrees C (68 and 77 degrees F). Throw away any unused medication after the expiration date. NOTE: This sheet is a summary. It may not cover all possible information. If you have questions about this medicine, talk to your doctor, pharmacist, or health care provider.  2022 Elsevier/Gold Standard (2020-10-07 00:00:00)

## 2021-10-17 ENCOUNTER — Other Ambulatory Visit: Payer: Self-pay | Admitting: Obstetrics and Gynecology

## 2021-10-17 ENCOUNTER — Telehealth: Payer: Self-pay

## 2021-10-17 MED ORDER — LEVONORGEST-ETH ESTRAD 91-DAY 0.15-0.03 &0.01 MG PO TABS: 1.0000 | ORAL_TABLET | Freq: Every day | ORAL | 0 refills | Status: DC

## 2021-10-17 NOTE — Telephone Encounter (Signed)
Pt calling for refill of  bc; has scheduled appt for 11/06/21.  [phone]  Left detailed vm refill eRx'd.

## 2021-11-06 ENCOUNTER — Other Ambulatory Visit: Payer: Self-pay

## 2021-11-06 ENCOUNTER — Ambulatory Visit (INDEPENDENT_AMBULATORY_CARE_PROVIDER_SITE_OTHER): Payer: BC Managed Care – PPO | Admitting: Obstetrics and Gynecology

## 2021-11-06 ENCOUNTER — Other Ambulatory Visit (HOSPITAL_COMMUNITY)
Admission: RE | Admit: 2021-11-06 | Discharge: 2021-11-06 | Disposition: A | Discharge status: Home / Self Care | Payer: BC Managed Care – PPO | Source: Ambulatory Visit | Attending: Obstetrics and Gynecology | Admitting: Obstetrics and Gynecology

## 2021-11-06 ENCOUNTER — Encounter: Payer: Self-pay | Admitting: Obstetrics and Gynecology

## 2021-11-06 VITALS — BP 110/70 | Ht 61.0 in | Wt 184.0 lb

## 2021-11-06 DIAGNOSIS — Z1151 Encounter for screening for human papillomavirus (HPV): Secondary | ICD-10-CM | POA: Diagnosis not present

## 2021-11-06 DIAGNOSIS — Z124 Encounter for screening for malignant neoplasm of cervix: Secondary | ICD-10-CM

## 2021-11-06 DIAGNOSIS — Z3041 Encounter for surveillance of contraceptive pills: Secondary | ICD-10-CM

## 2021-11-06 DIAGNOSIS — Z01419 Encounter for gynecological examination (general) (routine) without abnormal findings: Secondary | ICD-10-CM

## 2021-11-06 MED ORDER — LEVONORGEST-ETH ESTRAD 91-DAY 0.15-0.03 &0.01 MG PO TABS: 1.0000 | ORAL_TABLET | Freq: Every day | ORAL | 3 refills | Status: DC

## 2021-11-06 NOTE — Patient Instructions (Signed)
I value your feedback and you entrusting us with your care. If you get a [facility] patient survey, I would appreciate you taking the time to let us know about your experience today. Thank you! ? ? ?

## 2021-11-06 NOTE — Progress Notes (Signed)
PCP:  McLean-Scocuzza, Heather Glow, MD   Chief Complaint  Patient presents with   Gynecologic Exam    No concerns     HPI:      [name] is a 31 y.o. [phone] whose LMP was No LMP recorded. (Menstrual status: Oral contraceptives)., presents today for her annual examination.  Her menses are every 3 months with cont dosing OCPs, lasting 6 days.  Dysmenorrhea mild, occurring first 1-2 days of flow. She does not have intermenstrual bleeding. Hx of endometriosis, sx improved with OCPs. Hx of menstrual migraines, improved with cont dosing OCPs. Also has other migraines and tension headaches.   Sex activity: single partner, contraception - OCP (estrogen/progesterone). No pain/bleeding. May want TL.  Last Pap: 11/01/19 Results were: no abnormalities  Hx of STDs: none  There is no FH of breast cancer. There is no FH of ovarian cancer. The patient does not do self-breast exams. Hx of RT breast cyst.  Tobacco use: The patient denies current or previous tobacco use. Alcohol use: none No drug use.  Exercise: moderately active  She does get adequate calcium but not Vitamin D in her diet.  Patient Active Problem List   Diagnosis Date Noted   Encounter for care or examination of lactating mother 06/04/2020   Failed trial of labor following previous cesarean, delivered    Normal labor 06/01/2020   History of macrosomia in infant in prior pregnancy, currently pregnant 04/18/2020   Anemia affecting pregnancy in third trimester 04/05/2020   Supervision of high risk pregnancy, antepartum 11/01/2019   History of cesarean section complicating pregnancy 32/95/1884   Benign cyst of right breast 07/27/2018   Hidradenitis 03/05/2018   Breast mass, right 03/05/2018   Prediabetes 03/05/2018   Endometriosis 02/25/2018   Eczema 02/25/2018   Ovarian cyst 02/25/2018   Hematuria 02/25/2018   Mastitis during puerperium 04/24/2017   Anemia 04/13/2017   Annual physical exam 05/27/2013    Past  Surgical History:  Procedure Laterality Date   ABLATION ON ENDOMETRIOSIS  2012   CESAREAN SECTION N/A 04/12/2017   Procedure: CESAREAN SECTION;  Surgeon: Gae Dry, MD;  Location: [facility];  Service: Obstetrics;  Laterality: N/A;   CESAREAN SECTION N/A 06/02/2020   Procedure: CESAREAN SECTION;  Surgeon: [name], MD;  Location: [facility];  Service: Obstetrics;  Laterality: N/A;   LAPAROSCOPIC OVARIAN CYSTECTOMY Right 2012    Family History  Problem Relation Age of Onset   Hypertension Mother    Birth defects Mother        Congential heart condition    Endometriosis Mother    Diabetes Mother    Other Mother        endometrosis   Prostate cancer Father    Prostate cancer Paternal Grandfather    Diabetes Paternal Grandfather    Stroke Paternal Grandfather     Social History   Socioeconomic History   Marital status: Married    Spouse name: Heather Conner - Fiance   Number of children: Not on file   Years of education: 70   Highest education level: Not on file  Occupational History   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 She graduated from [employer] in 2008 and then went to [employer] and obtained her Bachelors in [employer] in 2012. Married with 1 son had in 2018. 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 She enjoys photography, hanging out with friends. She enjoys action movies.      Caffeine- Coffee 1 cup, 1 small can of soda   Social Determinants of Health    Financial Resource Strain: Not on file  Food Insecurity: Not on file  Transportation Needs: Not on file  Physical Activity: Not on file  Stress: Not on file  Social Connections: Not on file  Intimate Partner Violence: Not on file     Current Outpatient Medications:    Levonorgestrel-Ethinyl Estradiol (AMETHIA) 0.15-0.03 &0.01 MG tablet, Take 1 tablet by mouth daily. CONTINUOUS DOSING, Disp: 91 tablet, Rfl: 3     ROS:  Review of Systems  Constitutional:  Negative for fatigue, fever and unexpected weight change.  Respiratory:  Negative for cough, shortness of breath and wheezing.   Cardiovascular:  Negative for chest pain, palpitations and leg swelling.  Gastrointestinal:  Negative for blood in stool, constipation, diarrhea, nausea and vomiting.  Endocrine: Negative for cold intolerance, heat intolerance and polyuria.  Genitourinary:  Negative for dyspareunia, dysuria, flank pain, frequency, genital sores, hematuria, menstrual problem, pelvic pain, urgency, vaginal bleeding, vaginal discharge and vaginal pain.  Musculoskeletal:  Negative for back pain, joint swelling and myalgias.  Skin:  Negative for rash.  Neurological:  Negative for dizziness, syncope, light-headedness, numbness and headaches.  Hematological:  Negative for adenopathy.  Psychiatric/Behavioral:  Negative for agitation, confusion, sleep disturbance and suicidal ideas. The patient is not nervous/anxious.   BREAST: No symptoms   Objective: BP 110/70    Ht 5\' 1"  (1.549 m)    Wt 184 lb (83.5 kg)    Breastfeeding No    BMI 34.77 kg/m    Physical Exam Constitutional:      Appearance: She is well-developed.  Genitourinary:     Vulva normal.     Right Labia: No rash, tenderness or lesions.    Left Labia: No tenderness, lesions or rash.    No vaginal discharge, erythema or tenderness.      Right Adnexa: not tender and no mass present.    Left Adnexa: not tender and no mass present.    No cervical friability  or polyp.     Uterus is not enlarged or tender.  Breasts:    Right: No mass, nipple discharge, skin change or tenderness.     Left: No mass, nipple discharge, skin change or tenderness.  Neck:     Thyroid: No thyromegaly.  Cardiovascular:     Rate and Rhythm: Normal rate and regular rhythm.     Heart sounds: Normal heart sounds. No murmur heard. Pulmonary:     Effort: Pulmonary effort is normal.     Breath sounds: Normal breath sounds.  Abdominal:     Palpations: Abdomen is soft.     Tenderness: There is no abdominal tenderness. There is no guarding or rebound.  Musculoskeletal:        General: Normal range of motion.     Cervical back: Normal range of motion.  Lymphadenopathy:     Cervical: No cervical adenopathy.  Neurological:     General: No focal deficit present.     Mental Status: She is alert and oriented to person, place, and time.     Cranial Nerves: No cranial nerve deficit.  Skin:    General: Skin is warm and dry.  Psychiatric:        Mood and Affect: Mood normal.        Behavior: Behavior normal.        Thought Content: Thought content normal.        Judgment: Judgment normal.  Vitals reviewed.    Assessment/Plan: Encounter for annual routine gynecological examination  Cervical cancer screening - Plan: Cytology - PAP  Screening for HPV (human papillomavirus) - Plan: Cytology - PAP  Encounter for surveillance of contraceptive pills - Plan: Levonorgestrel-Ethinyl Estradiol (AMETHIA) 0.15-0.03 &0.01 MG tablet; OCP RF cont dosing. Will f/u with MD for TL prn.   Meds ordered this encounter  Medications   Levonorgestrel-Ethinyl Estradiol (AMETHIA) 0.15-0.03 &0.01 MG tablet    Sig: Take 1 tablet by mouth daily. CONTINUOUS DOSING    Dispense:  91 tablet    Refill:  3    Order Specific Question:   Supervising Provider    Answer:   Gae Dry [[id number]]             GYN counsel adequate intake of calcium and vitamin D, diet and exercise     F/U  Return  in about 1 year (around 11/06/2022).  Aylanie Cubillos B. Gretta Samons, PA-C 11/06/2021 4:55 PM

## 2021-11-08 LAB — CYTOLOGY - PAP
Comment: NEGATIVE
Diagnosis: NEGATIVE
High risk HPV: NEGATIVE

## 2022-10-15 ENCOUNTER — Telehealth: Payer: BC Managed Care – PPO | Admitting: Physician Assistant

## 2022-10-15 DIAGNOSIS — J069 Acute upper respiratory infection, unspecified: Secondary | ICD-10-CM | POA: Diagnosis not present

## 2022-10-15 MED ORDER — FLUTICASONE PROPIONATE 50 MCG/ACT NA SUSP: 2.0000 | Freq: Every day | NASAL | 0 refills | Status: DC

## 2022-10-15 MED ORDER — BENZONATATE 100 MG PO CAPS: 100.0000 mg | ORAL_CAPSULE | Freq: Three times a day (TID) | ORAL | 0 refills | Status: DC | PRN

## 2022-10-15 MED ORDER — ALBUTEROL SULFATE HFA 108 (90 BASE) MCG/ACT IN AERS: 2.0000 | INHALATION_SPRAY | Freq: Four times a day (QID) | RESPIRATORY_TRACT | 0 refills | Status: DC | PRN

## 2022-10-15 NOTE — Progress Notes (Signed)
E-Visit for Upper Respiratory Infection   We are sorry you are not feeling well.  Here is how we plan to help!  Based on what you have shared with me, it looks like you may have a viral upper respiratory infection.  Upper respiratory infections are caused by a large number of viruses; however, rhinovirus is the most common cause. Giving rising rates in our area, I do recommend COVID testing at home to be cautious.   Symptoms vary from person to person, with common symptoms including sore throat, cough, fatigue or lack of energy and feeling of general discomfort.  A low-grade fever of up to 100.4 may present, but is often uncommon.  Symptoms vary however, and are closely related to a person's age or underlying illnesses.  The most common symptoms associated with an upper respiratory infection are nasal discharge or congestion, cough, sneezing, headache and pressure in the ears and face.  These symptoms usually persist for about 3 to 10 days, but can last up to 2 weeks.  It is important to know that upper respiratory infections do not cause serious illness or complications in most cases.    Upper respiratory infections can be transmitted from person to person, with the most common method of transmission being a person's hands.  The virus is able to live on the skin and can infect other persons for up to 2 hours after direct contact.  Also, these can be transmitted when someone coughs or sneezes; thus, it is important to cover the mouth to reduce this risk.  To keep the spread of the illness at [place], good hand hygiene is very important.  This is an infection that is most likely caused by a virus. There are no specific treatments other than to help you with the symptoms until the infection runs its course.  We are sorry you are not feeling well.  Here is how we plan to help!   For nasal congestion, you may use an oral decongestants such as Mucinex D or if you have glaucoma or high blood pressure use plain  Mucinex.  Saline nasal spray or nasal drops can help and can safely be used as often as needed for congestion.  For your congestion, I have prescribed Fluticasone nasal spray one spray in each nostril twice a day  If you do not have a history of heart disease, hypertension, diabetes or thyroid disease, prostate/bladder issues or glaucoma, you may also use Sudafed to treat nasal congestion.  It is highly recommended that you consult with a pharmacist or your primary care physician to ensure this medication is safe for you to take.     If you have a cough, you may use cough suppressants such as Delsym and Robitussin.  If you have glaucoma or high blood pressure, you can also use Coricidin HBP.   For cough I have prescribed for you A prescription cough medication called Tessalon Perles 100 mg. You may take 1-2 capsules every 8 hours as needed for cough  I have also sent in an albuterol inhaler to use as directed, when needed, for chest tightness and windedness.   If you have a sore or scratchy throat, use a saltwater gargle-  to  teaspoon of salt dissolved in a 4-ounce to 8-ounce glass of warm water.  Gargle the solution for approximately 15-30 seconds and then spit.  It is important not to swallow the solution.  You can also use throat lozenges/cough drops and Chloraseptic spray to help with  throat pain or discomfort.  Warm or cold liquids can also be helpful in relieving throat pain.  For headache, pain or general discomfort, you can use Ibuprofen or Tylenol as directed.   Some authorities believe that zinc sprays or the use of Echinacea may shorten the course of your symptoms.   HOME CARE Only take medications as instructed by your medical team. Be sure to drink plenty of fluids. Water is fine as well as fruit juices, sodas and electrolyte beverages. You may want to stay away from caffeine or alcohol. If you are nauseated, try taking small sips of liquids. How do you know if you are getting enough  fluid? Your urine should be a pale yellow or almost colorless. Get rest. Taking a steamy shower or using a humidifier may help nasal congestion and ease sore throat pain. You can place a towel over your head and breathe in the steam from hot water coming from a faucet. Using a saline nasal spray works much the same way. Cough drops, hard candies and sore throat lozenges may ease your cough. Avoid close contacts especially the very young and the elderly Cover your mouth if you cough or sneeze Always remember to wash your hands.   GET HELP RIGHT AWAY IF: You develop worsening fever. If your symptoms do not improve within 10 days You develop yellow or green discharge from your nose over 3 days. You have coughing fits You develop a severe head ache or visual changes. You develop shortness of breath, difficulty breathing or start having chest pain Your symptoms persist after you have completed your treatment plan  MAKE SURE YOU  Understand these instructions. Will watch your condition. Will get help right away if you are not doing well or get worse.  Thank you for choosing an e-visit.  Your e-visit answers were reviewed by a board certified advanced clinical practitioner to complete your personal care plan. Depending upon the condition, your plan could have included both over the counter or prescription medications.  Please review your pharmacy choice. Make sure the pharmacy is open so you can pick up prescription now. If there is a problem, you may contact your provider through [employer] and have the prescription routed to another pharmacy.  Your safety is important to [place]. If you have drug allergies check your prescription carefully.   For the next 24 hours you can use MyChart to ask questions about today's visit, request a non-urgent call back, or ask for a work or school excuse. You will get an email in the next two days asking about your experience. I hope that your e-visit has  been valuable and will speed your recovery.

## 2022-10-15 NOTE — Progress Notes (Signed)
I have spent 5 minutes in review of e-visit questionnaire, review and updating patient chart, medical decision making and response to patient.   Concetta Guion Cody Faraz Ponciano, PA-C    

## 2023-01-07 NOTE — Progress Notes (Unsigned)
PCP:  Patient, No Pcp Per   No chief complaint on file.    HPI:      [name] is a 32 y.o. [id number] whose LMP was No LMP recorded. (Menstrual status: Oral contraceptives)., presents today for her annual examination.  Her menses are every 3 months with cont dosing OCPs, lasting 6 days.  Dysmenorrhea mild, occurring first 1-2 days of flow. She does not have intermenstrual bleeding. Hx of endometriosis, sx improved with OCPs. Hx of menstrual migraines, improved with cont dosing OCPs. Also has other migraines and tension headaches.   Sex activity: single partner, contraception - OCP (estrogen/progesterone). No pain/bleeding. May want TL.  Last Pap: 11/06/21 Results were: no abnormalities /neg HPV DNA Hx of STDs: none  There is no FH of breast cancer. There is no FH of ovarian cancer. The patient does not do self-breast exams. Hx of RT breast cyst.  Tobacco use: The patient denies current or previous tobacco use. Alcohol use: none No drug use.  Exercise: moderately active  She does get adequate calcium but not Vitamin D in her diet.  Patient Active Problem List   Diagnosis Date Noted   Encounter for care or examination of lactating mother 06/04/2020   Failed trial of labor following previous cesarean, delivered    Normal labor 06/01/2020   History of macrosomia in infant in prior pregnancy, currently pregnant 04/18/2020   Anemia affecting pregnancy in third trimester 04/05/2020   Supervision of high risk pregnancy, antepartum 11/01/2019   History of cesarean section complicating pregnancy [id number]   Benign cyst of right breast 07/27/2018   Hidradenitis 03/05/2018   Breast mass, right 03/05/2018   Prediabetes 03/05/2018   Endometriosis 02/25/2018   Eczema 02/25/2018   Ovarian cyst 02/25/2018   Hematuria 02/25/2018   Mastitis during puerperium 04/24/2017   Anemia 04/13/2017   Annual physical exam 05/27/2013    Past Surgical History:  Procedure Laterality Date    ABLATION ON ENDOMETRIOSIS  2012   CESAREAN SECTION N/A 04/12/2017   Procedure: CESAREAN SECTION;  Surgeon: Gae Dry, MD;  Location: [facility];  Service: Obstetrics;  Laterality: N/A;   CESAREAN SECTION N/A 06/02/2020   Procedure: CESAREAN SECTION;  Surgeon: [name], MD;  Location: [facility] OR[facility];  Service: Obstetrics;  Laterality: N/A;   LAPAROSCOPIC OVARIAN CYSTECTOMY Right 2012    Family History  Problem Relation Age of Onset   Hypertension Mother    Birth defects Mother        Congential heart condition    Endometriosis Mother    Diabetes Mother    Other Mother        endometrosis   Prostate cancer Father    Prostate cancer Paternal Grandfather    Diabetes Paternal Grandfather    Stroke Paternal Grandfather     Social History   Socioeconomic History   Marital status: Married    Spouse name: Abby [place]ar[place] - Fiance   Number of children: Not on file   Years of education: 39   Highest education level: Not on file  Occupational History   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 She graduated from [employer] in 2008 and then went to [employer] and obtained her Bachelors in [employer] in 2012. Married with 1 son had in 2018. 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 She enjoys photography, hanging out with friends. She enjoys action movies.      Caffeine- Coffee 1 cup, 1 small can of soda   Social Determinants of Health   Financial Resource Strain: Not on file   Food Insecurity: Not on file  Transportation Needs: Not on file  Physical Activity: Not on file  Stress: Not on file  Social Connections: Not on file  Intimate Partner Violence: Not on file     Current Outpatient Medications:    albuterol (VENTOLIN HFA) 108 (90 Base) MCG/ACT inhaler, Inhale 2 puffs into the lungs every 6 (six) hours as needed for wheezing or shortness of breath., Disp: 8 g, Rfl: 0   benzonatate (TESSALON) 100 MG capsule, Take 1 capsule (100 mg total) by mouth 3 (three) times daily as needed for cough., Disp: 30 capsule, Rfl: 0   fluticasone (FLONASE) 50 MCG/ACT nasal spray, Place 2 sprays into both nostrils daily., Disp: 16 g, Rfl: 0   Levonorgestrel-Ethinyl Estradiol (AMETHIA) 0.15-0.03 &0.01 MG tablet, Take 1 tablet by mouth daily. CONTINUOUS DOSING, Disp: 91 tablet, Rfl: 3     ROS:  Review of Systems  Constitutional:  Negative for fatigue, fever and unexpected weight change.  Respiratory:  Negative for cough, shortness of breath and wheezing.   Cardiovascular:  Negative for chest pain, palpitations and leg swelling.  Gastrointestinal:  Negative for blood in stool, constipation, diarrhea, nausea and vomiting.  Endocrine: Negative for cold intolerance, heat intolerance and polyuria.  Genitourinary:  Negative for dyspareunia, dysuria, flank pain, frequency, genital sores, hematuria, menstrual problem, pelvic pain, urgency, vaginal bleeding, vaginal discharge and vaginal pain.  Musculoskeletal:  Negative for back pain, joint swelling and myalgias.  Skin:  Negative for rash.  Neurological:  Negative for dizziness, syncope, light-headedness, numbness and headaches.  Hematological:  Negative for adenopathy.  Psychiatric/Behavioral:  Negative for agitation, confusion, sleep disturbance and suicidal ideas. The patient is not nervous/anxious.    BREAST: No symptoms   Objective: There were no vitals taken for this visit.   Physical Exam Constitutional:       Appearance: She is well-developed.  Genitourinary:     Vulva normal.     Right Labia: No rash, tenderness or lesions.    Left Labia: No tenderness, lesions or rash.    No vaginal discharge, erythema or tenderness.      Right Adnexa: not tender and no mass present.    Left Adnexa: not tender and no mass present.    No cervical friability or polyp.     Uterus is not enlarged or tender.  Breasts:    Right: No mass, nipple discharge, skin change or tenderness.     Left: No mass, nipple discharge, skin change or tenderness.  Neck:     Thyroid: No thyromegaly.  Cardiovascular:     Rate and Rhythm: Normal rate and regular rhythm.     Heart sounds: Normal heart sounds. No murmur heard. Pulmonary:     Effort: Pulmonary effort is normal.     Breath sounds: Normal breath sounds.  Abdominal:  Palpations: Abdomen is soft.     Tenderness: There is no abdominal tenderness. There is no guarding or rebound.  Musculoskeletal:        General: Normal range of motion.     Cervical back: Normal range of motion.  Lymphadenopathy:     Cervical: No cervical adenopathy.  Neurological:     General: No focal deficit present.     Mental Status: She is alert and oriented to person, place, and time.     Cranial Nerves: No cranial nerve deficit.  Skin:    General: Skin is warm and dry.  Psychiatric:        Mood and Affect: Mood normal.        Behavior: Behavior normal.        Thought Content: Thought content normal.        Judgment: Judgment normal.  Vitals reviewed.     Assessment/Plan: Encounter for annual routine gynecological examination  Cervical cancer screening - Plan: Cytology - PAP  Screening for HPV (human papillomavirus) - Plan: Cytology - PAP  Encounter for surveillance of contraceptive pills - Plan: Levonorgestrel-Ethinyl Estradiol (AMETHIA) 0.15-0.03 &0.01 MG tablet; OCP RF cont dosing. Will f/u with MD for TL prn.   No orders of the defined types were placed in this  encounter.            GYN counsel adequate intake of calcium and vitamin D, diet and exercise     F/U  No follow-ups on file.  Klinton Candelas B. Mashayla Lavin, PA-C 01/07/2023 9:23 PM

## 2023-01-08 ENCOUNTER — Encounter: Payer: Self-pay | Admitting: Obstetrics and Gynecology

## 2023-01-08 ENCOUNTER — Ambulatory Visit (INDEPENDENT_AMBULATORY_CARE_PROVIDER_SITE_OTHER): Payer: BC Managed Care – PPO | Admitting: Obstetrics and Gynecology

## 2023-01-08 VITALS — BP 110/80 | Ht 61.0 in | Wt 187.0 lb

## 2023-01-08 DIAGNOSIS — Z3041 Encounter for surveillance of contraceptive pills: Secondary | ICD-10-CM

## 2023-01-08 DIAGNOSIS — N809 Endometriosis, unspecified: Secondary | ICD-10-CM

## 2023-01-08 DIAGNOSIS — R6882 Decreased libido: Secondary | ICD-10-CM

## 2023-01-08 DIAGNOSIS — Z01419 Encounter for gynecological examination (general) (routine) without abnormal findings: Secondary | ICD-10-CM | POA: Diagnosis not present

## 2023-01-08 MED ORDER — LEVONORGEST-ETH ESTRAD 91-DAY 0.15-0.03 &0.01 MG PO TABS: 1.0000 | ORAL_TABLET | Freq: Every day | ORAL | 3 refills | Status: DC

## 2023-01-08 NOTE — Patient Instructions (Signed)
I value your feedback and you entrusting us with your care. If you get a [facility] patient survey, I would appreciate you taking the time to let us know about your experience today. Thank you! ? ? ?

## 2023-08-12 ENCOUNTER — Encounter: Payer: Self-pay | Admitting: Nurse Practitioner

## 2023-09-23 ENCOUNTER — Ambulatory Visit: Payer: BC Managed Care – PPO | Admitting: Nurse Practitioner

## 2023-10-14 ENCOUNTER — Ambulatory Visit: Payer: BC Managed Care – PPO | Admitting: Nurse Practitioner

## 2023-10-14 VITALS — BP 126/88 | HR 97 | Temp 98.7°F | Ht 61.0 in | Wt 192.4 lb

## 2023-10-14 DIAGNOSIS — Z0001 Encounter for general adult medical examination with abnormal findings: Secondary | ICD-10-CM | POA: Insufficient documentation

## 2023-10-14 DIAGNOSIS — Z1329 Encounter for screening for other suspected endocrine disorder: Secondary | ICD-10-CM

## 2023-10-14 DIAGNOSIS — R519 Headache, unspecified: Secondary | ICD-10-CM

## 2023-10-14 DIAGNOSIS — E559 Vitamin D deficiency, unspecified: Secondary | ICD-10-CM | POA: Diagnosis not present

## 2023-10-14 DIAGNOSIS — R7303 Prediabetes: Secondary | ICD-10-CM | POA: Diagnosis not present

## 2023-10-14 DIAGNOSIS — Z13220 Encounter for screening for lipoid disorders: Secondary | ICD-10-CM

## 2023-10-14 DIAGNOSIS — N809 Endometriosis, unspecified: Secondary | ICD-10-CM

## 2023-10-14 DIAGNOSIS — D649 Anemia, unspecified: Secondary | ICD-10-CM | POA: Diagnosis not present

## 2023-10-14 MED ORDER — PROPRANOLOL HCL ER 80 MG PO CP24: 80.0000 mg | ORAL_CAPSULE | Freq: Every day | ORAL | 1 refills | Status: DC

## 2023-10-14 NOTE — Progress Notes (Unsigned)
Bethanie Dicker, NP-C Phone: [phone]  Heather Conner is a 32 y.o. female who presents today to establish care and for annual exam.   Discussed the use of AI scribe software for clinical note transcription with the patient, who gave verbal consent to proceed.  History of Present Illness   The patient, a teacher with a history of endometriosis and a previous skin cancer spot, presents for a routine physical after a gap of five years. The patient reports experiencing headaches at least once a week, with severe episodes occurring approximately once a month. These headaches are often accompanied by pain behind the eyes and light sensitivity, occasionally escalating to a level that requires the patient to lie down with a hot rag on her head. The patient also notes an increase in feelings of anxiety and irritability, although she has developed coping mechanisms to manage these symptoms.  The patient has been on continuous birth control since her early twenties, following a diagnosis of endometriosis. She reports a history of debilitating headaches and period pain during her college years, which were managed with the birth control. The patient also mentions a history of anemia, both during and outside of pregnancy, and has been advised to take Vitamin D supplements.  The patient acknowledges being overweight and expresses a desire to lose weight. She reports a diet that includes a fair amount of vegetables and a preference for chicken over other meats. The patient admits to not exercising regularly and consuming soda. She also reports late-night habits due to her work and personal schedule, but does not report any sleep disturbances.  The patient has a history of a skin cancer spot on her back, which was removed five years ago. She regularly sees a dermatologist and has not reported any new skin changes or rashes. The patient denies any chest pain, shortness of breath, abdominal pains, or trouble using the  bathroom. She also denies any changes in vision, dizziness, or joint pains.      Active Ambulatory Problems    Diagnosis Date Noted   Annual physical exam 05/27/2013   Anemia 04/13/2017   Endometriosis 02/25/2018   Eczema 02/25/2018   Ovarian cyst 02/25/2018   Hidradenitis 03/05/2018   Breast mass, right 03/05/2018   Prediabetes 03/05/2018   Benign cyst of right breast 07/27/2018   Encounter for routine adult medical exam with abnormal findings 10/14/2023   Frequent headaches 10/14/2023   Vitamin D deficiency 10/14/2023   Resolved Ambulatory Problems    Diagnosis Date Noted   Fatigue 05/27/2013   Sleep disturbance 05/27/2013   Streptococcal sore throat 12/10/2015   Superficial swelling of eyelid 03/13/2016   Supervision of low-risk pregnancy, third trimester 01/09/2017   Anemia complicating pregnancy in third trimester 01/27/2017   PUPPP (pruritic urticarial papules and plaques of pregnancy) 03/18/2017   [redacted] weeks gestation of pregnancy 04/06/2017   Post term pregnancy 04/11/2017   Previous cesarean delivery, delivered 04/21/2017   Mastitis associated with childbirth, delivered 04/21/2017   Mastitis during puerperium 04/24/2017   Hematuria 02/25/2018   Supervision of high risk pregnancy, antepartum 11/01/2019   History of cesarean section complicating pregnancy 11/01/2019   Anemia affecting pregnancy in third trimester 04/05/2020   History of macrosomia in infant in prior pregnancy, currently pregnant 04/18/2020   Normal labor 06/01/2020   Failed trial of labor following previous cesarean, delivered    Encounter for care or examination of lactating mother 06/04/2020   Past Medical History:  Diagnosis Date   History of ovarian  cyst    Melanoma (HCC)    Migraine     Family History  Problem Relation Age of Onset   Hypertension Mother    Birth defects Mother        Congential heart condition    Endometriosis Mother    Diabetes Mother    Other Mother         endometrosis   Prostate cancer Father    Lung cancer Maternal Grandmother    Prostate cancer Paternal Grandfather    Diabetes Paternal Grandfather    Stroke Paternal Grandfather     Social History   Socioeconomic History   Marital status: Married    Spouse name: Sherissa Gravely - Fiance   Number of children: Not on file   Years of education: 16   Highest education level: Not on file  Occupational History   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 She graduated from [employer] in 2008 and then went to [employer] and obtained her Bachelors in [employer] in 2012. Married with 1 son had in 2018. 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 She enjoys photography, hanging out with friends. She enjoys action movies.      Caffeine- Coffee 1 cup, 1 small can of soda   Social Drivers of Corporate investment banker Strain: Not on file  Food Insecurity: Not on file  Transportation Needs: Not on file  Physical Activity: Not on file  Stress: Not on file  Social Connections: Not on file  Intimate Partner Violence: Not on file    ROS  General:  Negative for unexplained weight loss, fever Skin: Negative for new or changing mole, sore that won't heal HEENT: Negative for trouble hearing, trouble seeing, ringing in ears, mouth sores, hoarseness, change in voice, dysphagia. CV:  Negative for chest pain, dyspnea, edema, palpitations Resp: Negative for cough, dyspnea, hemoptysis GI: Negative  for nausea, vomiting, diarrhea, constipation, abdominal pain, melena, hematochezia. GU: Negative for dysuria, incontinence, urinary hesitance, hematuria, vaginal or penile discharge, polyuria, sexual difficulty, lumps in testicle or breasts MSK: Negative for muscle cramps or aches, joint pain or swelling Neuro: Negative for weakness, numbness, dizziness, passing out/fainting Psych: Negative for depression, anxiety, memory problems  Objective  Physical Exam Vitals:   10/14/23 1525  BP: 126/88  Pulse: 97  Temp: 98.7 F (37.1 C)  SpO2: 98%    BP Readings from Last 3 Encounters:  10/14/23 126/88  01/08/23 110/80  11/06/21 110/70   Wt Readings from Last 3 Encounters:  10/14/23 192 lb 6.4 oz (87.3 kg)  01/08/23 187 lb (84.8 kg)  11/06/21 184 lb (83.5 kg)    Physical Exam Constitutional:      General: She is not in acute distress.    Appearance: Normal appearance.  HENT:     Head: Normocephalic.     Right Ear: Tympanic membrane normal.     Left Ear: Tympanic membrane  normal.     Nose: Nose normal.     Mouth/Throat:     Mouth: Mucous membranes are moist.     Pharynx: Oropharynx is clear.  Eyes:     Conjunctiva/sclera: Conjunctivae normal.     Pupils: Pupils are equal, round, and reactive to light.  Neck:     Thyroid: No thyromegaly.  Cardiovascular:     Rate and Rhythm: Normal rate and regular rhythm.     Heart sounds: Normal heart sounds.  Pulmonary:     Effort: Pulmonary effort is normal.     Breath sounds: Normal breath sounds.  Abdominal:     General: Abdomen is flat. Bowel sounds are normal.     Palpations: Abdomen is soft. There is no mass.     Tenderness: There is no abdominal tenderness.  Musculoskeletal:        General: Normal range of motion.  Lymphadenopathy:     Cervical: No cervical adenopathy.  Skin:    General: Skin is warm and dry.     Findings: No rash.  Neurological:     General: No focal deficit present.     Mental Status: She is alert.   Psychiatric:        Mood and Affect: Mood normal.        Behavior: Behavior normal.    Assessment/Plan:   Encounter for routine adult medical exam with abnormal findings Assessment & Plan: Physical exam complete. We will order routine lab work as outlined and contact patient with the results. She will continue regular dermatology follow-ups due to a history of skin cancer. Her pap smear is up to date. She politely declined the flu vaccine today. She is up to date on her tetanus vaccine and declines all COVID vaccines. Advised regular follow ups with dentist and eye doctor for annual exams. Encouraged healthy diet, increase protein intake and to begin exercising. Return to care in one year, sooner as needed.    Frequent headaches Assessment & Plan: She experiences weekly headaches, with severe episodes occurring monthly, suggesting possible migraines accompanied by photophobia, but no nausea, vomiting, or vision changes. We will start Propranolol for headache prevention and its potential anxiety benefits, with a check-in planned in 6 weeks to assess effectiveness.   Orders: -     Comprehensive metabolic panel -     Propranolol HCl ER; Take 1 capsule (80 mg total) by mouth daily.  Dispense: 90 capsule; Refill: 1  Endometriosis Assessment & Plan: She has managed her endometriosis with continuous birth control since her early twenties and currently reports no dysmenorrhea. We will continue her current birth control regimen. Follow up with Ob-Gyn as scheduled.    Anemia, unspecified type Assessment & Plan: She has a history of anemia, both during and outside of pregnancy, but is currently asymptomatic. We will order an iron panel and complete blood count to assess her current status.  Orders: -     CBC with Differential/Platelet -     IBC + Ferritin  Prediabetes Assessment & Plan: With a history of prediabetes and a family history of diabetes, we will order an A1C for diabetes  screening. Encouraged healthy diet and exercise.   Orders: -     Hemoglobin A1c  Vitamin D deficiency Assessment & Plan: She has a history of low Vitamin D levels and is currently on supplementation, though adherence is inconsistent. We will check Vitamin D levels.  Orders: -     VITAMIN D 25 Hydroxy (Vit-D Deficiency, Fractures)  Thyroid disorder screen -     TSH  Lipid screening -     Lipid panel    Return in about 1 year (around 10/13/2024) for Annual Exam, sooner as needed.   Bethanie Dicker, NP-C [facility]

## 2023-10-15 ENCOUNTER — Encounter: Payer: Self-pay | Admitting: Nurse Practitioner

## 2023-10-15 LAB — CBC WITH DIFFERENTIAL/PLATELET
Basophils Absolute: 0 10*3/uL (ref 0.0–0.1)
Basophils Relative: 0.5 % (ref 0.0–3.0)
Eosinophils Absolute: 0.1 10*3/uL (ref 0.0–0.7)
Eosinophils Relative: 0.7 % (ref 0.0–5.0)
HCT: 36.4 % (ref 36.0–46.0)
Hemoglobin: 12.3 g/dL (ref 12.0–15.0)
Lymphocytes Relative: 34.5 % (ref 12.0–46.0)
Lymphs Abs: 3.1 10*3/uL (ref 0.7–4.0)
MCHC: 33.7 g/dL (ref 30.0–36.0)
MCV: 92.6 fL (ref 78.0–100.0)
Monocytes Absolute: 0.7 10*3/uL (ref 0.1–1.0)
Monocytes Relative: 8.2 % (ref 3.0–12.0)
Neutro Abs: 5.1 10*3/uL (ref 1.4–7.7)
Neutrophils Relative %: 56.1 % (ref 43.0–77.0)
Platelets: 307 10*3/uL (ref 150.0–400.0)
RBC: 3.93 Mil/uL (ref 3.87–5.11)
RDW: 12.9 % (ref 11.5–15.5)
WBC: 9 10*3/uL (ref 4.0–10.5)

## 2023-10-15 LAB — COMPREHENSIVE METABOLIC PANEL
ALT: 15 U/L (ref 0–35)
AST: 14 U/L (ref 0–37)
Albumin: 4.2 g/dL (ref 3.5–5.2)
Alkaline Phosphatase: 102 U/L (ref 39–117)
BUN: 16 mg/dL (ref 6–23)
CO2: 24 meq/L (ref 19–32)
Calcium: 9.5 mg/dL (ref 8.4–10.5)
Chloride: 103 meq/L (ref 96–112)
Creatinine, Ser: 0.94 mg/dL (ref 0.40–1.20)
GFR: 79.15 mL/min (ref >60.00)
Glucose, Bld: 87 mg/dL (ref 70–99)
Potassium: 4 meq/L (ref 3.5–5.1)
Sodium: 137 meq/L (ref 135–145)
Total Bilirubin: 0.3 mg/dL (ref 0.2–1.2)
Total Protein: 7.8 g/dL (ref 6.0–8.3)

## 2023-10-15 LAB — VITAMIN D 25 HYDROXY (VIT D DEFICIENCY, FRACTURES): VITD: 38.74 ng/mL (ref 30.00–100.00)

## 2023-10-15 LAB — LIPID PANEL
Cholesterol: 182 mg/dL (ref 0–200)
HDL: 40.2 mg/dL (ref >39.00)
LDL Cholesterol: 120 mg/dL — ABNORMAL HIGH (ref 0–99)
NonHDL: 141.31
Total CHOL/HDL Ratio: 5
Triglycerides: 105 mg/dL (ref 0.0–149.0)
VLDL: 21 mg/dL (ref 0.0–40.0)

## 2023-10-15 LAB — IBC + FERRITIN
Ferritin: 87.9 ng/mL (ref 10.0–291.0)
Iron: 63 ug/dL (ref 42–145)
Saturation Ratios: 16.2 % — ABNORMAL LOW (ref 20.0–50.0)
TIBC: 387.8 ug/dL (ref 250.0–450.0)
Transferrin: 277 mg/dL (ref 212.0–360.0)

## 2023-10-15 LAB — TSH: TSH: 1.48 u[IU]/mL (ref 0.35–5.50)

## 2023-10-15 LAB — HEMOGLOBIN A1C: Hgb A1c MFr Bld: 6.1 % (ref 4.6–6.5)

## 2023-10-15 NOTE — Assessment & Plan Note (Signed)
With a history of prediabetes and a family history of diabetes, we will order an A1C for diabetes screening. Encouraged healthy diet and exercise.

## 2023-10-15 NOTE — Assessment & Plan Note (Addendum)
She has managed her endometriosis with continuous birth control since her early twenties and currently reports no dysmenorrhea. We will continue her current birth control regimen. Follow up with Ob-Gyn as scheduled.

## 2023-10-15 NOTE — Assessment & Plan Note (Signed)
Physical exam complete. We will order routine lab work as outlined and contact patient with the results. She will continue regular dermatology follow-ups due to a history of skin cancer. Her pap smear is up to date. She politely declined the flu vaccine today. She is up to date on her tetanus vaccine and declines all COVID vaccines. Advised regular follow ups with dentist and eye doctor for annual exams. Encouraged healthy diet, increase protein intake and to begin exercising. Return to care in one year, sooner as needed.

## 2023-10-15 NOTE — Assessment & Plan Note (Signed)
She has a history of anemia, both during and outside of pregnancy, but is currently asymptomatic. We will order an iron panel and complete blood count to assess her current status.

## 2023-10-15 NOTE — Assessment & Plan Note (Signed)
She has a history of low Vitamin D levels and is currently on supplementation, though adherence is inconsistent. We will check Vitamin D levels.

## 2023-10-15 NOTE — Assessment & Plan Note (Signed)
She experiences weekly headaches, with severe episodes occurring monthly, suggesting possible migraines accompanied by photophobia, but no nausea, vomiting, or vision changes. We will start Propranolol for headache prevention and its potential anxiety benefits, with a check-in planned in 6 weeks to assess effectiveness.

## 2023-12-04 ENCOUNTER — Other Ambulatory Visit: Payer: Self-pay | Admitting: Nurse Practitioner

## 2023-12-04 DIAGNOSIS — R519 Headache, unspecified: Secondary | ICD-10-CM

## 2023-12-04 MED ORDER — PROPRANOLOL HCL ER 120 MG PO CP24: 120.0000 mg | ORAL_CAPSULE | Freq: Every day | ORAL | 0 refills | Status: DC

## 2024-01-14 NOTE — Progress Notes (Signed)
 PCP:  Bethanie Dicker, NP   Chief Complaint  Patient presents with   Gynecologic Exam    Discuss hormones     HPI:      Heather Conner is a 33 y.o. [id number] whose LMP was Patient's last menstrual period was 10/25/2023 (approximate)., presents today for her annual examination.  Her menses are every 3 months with cont dosing OCPs, lasting 4 days, mod flow, no BTB, mild dysmen, no meds needed. Hx of endometriosis, sx much improved with OCPs. Hx of menstrual migraines, improved with cont dosing OCPs. Also has other migraines and tension headaches. Having fatigue (sleeping about 6 hrs nightly and takes naps), headaches, feels "edgy" and wonders if related to OCPs. Under stress with job/young children. Not getting regular exercise but plans to start now that weather is better.  Sex activity: single partner, contraception - OCP (estrogen/progesterone). No pain/bleeding/dryness. Last Pap: 11/06/21 Results were: no abnormalities /neg HPV DNA Hx of STDs: none  There is no FH of breast cancer. There is no FH of ovarian cancer. The patient does not do self-breast exams. Hx of RT breast cyst.  Tobacco use: The patient denies current or previous tobacco use. Alcohol use: none No drug use.  Exercise: min active  She does get adequate calcium and Vitamin D in her diet.  Patient Active Problem List   Diagnosis Date Noted   Encounter for routine adult medical exam with abnormal findings 10/14/2023   Frequent headaches 10/14/2023   Vitamin D deficiency 10/14/2023   Benign cyst of right breast 07/27/2018   Hidradenitis 03/05/2018   Breast mass, right 03/05/2018   Prediabetes 03/05/2018   Endometriosis 02/25/2018   Eczema 02/25/2018   Ovarian cyst 02/25/2018   Anemia 04/13/2017   Annual physical exam 05/27/2013    Past Surgical History:  Procedure Laterality Date   ABLATION ON ENDOMETRIOSIS  2012   CESAREAN SECTION N/A 04/12/2017   Procedure: CESAREAN SECTION;  Surgeon: Nadara Mustard,  MD;  Location: [facility] ORS;  Service: Obstetrics;  Laterality: N/A;   CESAREAN SECTION N/A 06/02/2020   Procedure: CESAREAN SECTION;  Surgeon: Vena Austria, MD;  Location: [facility] ORS;  Service: Obstetrics;  Laterality: N/A;   LAPAROSCOPIC OVARIAN CYSTECTOMY Right 2012    Family History  Problem Relation Age of Onset   Hypertension Mother    Birth defects Mother        Congential heart condition    Endometriosis Mother    Diabetes Mother    Other Mother        endometrosis   Prostate cancer Father    Lung cancer Maternal Grandmother    Prostate cancer Paternal Grandfather    Diabetes Paternal Grandfather    Stroke Paternal Grandfather     Social History   Socioeconomic History   Marital status: Married    Spouse name: Patsy Zaragoza - Fiance   Number of children: Not on file   Years of education: 16   Highest education level: Not on file  Occupational History   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 She graduated from [employer] in 2008 and then went to [employer] and obtained her Bachelors in [employer] in 2012. Married with 1 son had in 2018. 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 She enjoys photography, hanging out with friends. She enjoys action movies.      Caffeine- Coffee 1 cup, 1 small can of soda   Social Drivers of Corporate investment banker Strain: Not on file  Food Insecurity: Not on file  Transportation Needs: Not on file  Physical Activity: Not on  file  Stress: Not on file  Social Connections: Not on file  Intimate Partner Violence: Not on file     Current Outpatient Medications:    clindamycin (CLEOCIN T) 1 % lotion, daily., Disp: , Rfl:    clobetasol cream (TEMOVATE) 0.05 %, Apply topically 2 (two) times daily., Disp: , Rfl:    propranolol ER (INDERAL LA) 120 MG 24 hr capsule, Take 1 capsule (120 mg total) by mouth daily., Disp: 90 capsule, Rfl: 0   triamcinolone cream (KENALOG) 0.1 %, Apply topically., Disp: , Rfl:    Levonorgestrel-Ethinyl Estradiol (AMETHIA) 0.15-0.03 &0.01 MG tablet, Take 1 tablet by mouth daily. CONTINUOUS DOSING, Disp: 91 tablet, Rfl: 3     ROS:  Review of Systems  Constitutional:  Positive for fatigue. Negative for fever and unexpected weight change.  Respiratory:  Negative for cough, shortness of breath and wheezing.   Cardiovascular:  Negative for chest pain, palpitations and leg swelling.  Gastrointestinal:  Negative for blood in stool, constipation, diarrhea, nausea and vomiting.  Endocrine: Negative for cold intolerance, heat intolerance and polyuria.  Genitourinary:  Negative for dyspareunia, dysuria, flank pain, frequency, genital sores, hematuria, menstrual problem, pelvic pain, urgency, vaginal bleeding, vaginal discharge and vaginal pain.  Musculoskeletal:  Negative for back pain, joint swelling and myalgias.  Skin:  Negative for rash.  Neurological:  Negative for dizziness, syncope, light-headedness, numbness and headaches.  Hematological:  Negative for adenopathy.  Psychiatric/Behavioral:  Positive for agitation. Negative for confusion, sleep disturbance and suicidal ideas. The patient is not nervous/anxious.    BREAST: No symptoms   Objective: BP 110/71   Pulse 81   Ht 5\' 1"  (1.549 m)   Wt 199 lb (90.3 kg)   LMP 10/25/2023 (Approximate)   BMI 37.60 kg/m    Physical Exam Constitutional:      Appearance: She is well-developed.  Genitourinary:     Vulva normal.     Right  Labia: No rash, tenderness or lesions.    Left Labia: No tenderness, lesions or rash.    No vaginal discharge, erythema or tenderness.      Right Adnexa: not tender and no mass present.    Left Adnexa: not tender and no mass present.    No cervical friability or polyp.     Uterus is not enlarged or tender.  Breasts:    Right: No mass, nipple discharge, skin change or tenderness.     Left: No mass, nipple discharge, skin change or tenderness.  Neck:     Thyroid: No thyromegaly.  Cardiovascular:     Rate and Rhythm: Normal rate and regular rhythm.     Heart sounds: Normal heart sounds. No murmur heard. Pulmonary:     Effort: Pulmonary effort is normal.     Breath sounds: Normal breath sounds.  Abdominal:  Palpations: Abdomen is soft.     Tenderness: There is no abdominal tenderness. There is no guarding or rebound.  Musculoskeletal:        General: Normal range of motion.     Cervical back: Normal range of motion.  Lymphadenopathy:     Cervical: No cervical adenopathy.  Neurological:     General: No focal deficit present.     Mental Status: She is alert and oriented to person, place, and time.     Cranial Nerves: No cranial nerve deficit.  Skin:    General: Skin is warm and dry.  Psychiatric:        Mood and Affect: Mood normal.        Behavior: Behavior normal.        Thought Content: Thought content normal.        Judgment: Judgment normal.  Vitals reviewed.     Assessment/Plan: Encounter for annual routine gynecological examination  Encounter for surveillance of contraceptive pills - Plan: Levonorgestrel-Ethinyl Estradiol (AMETHIA) 0.15-0.03 &0.01 MG tablet; OCP RF eRxd. Doing well  Endometriosis - Plan: Levonorgestrel-Ethinyl Estradiol (AMETHIA) 0.15-0.03 &0.01 MG tablet; controlled with OCPs.   Mood change--most likely stress related. Increase sleep and exercise, time for herself. 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 F/u prn.    Meds  ordered this encounter  Medications   Levonorgestrel-Ethinyl Estradiol (AMETHIA) 0.15-0.03 &0.01 MG tablet    Sig: Take 1 tablet by mouth daily. CONTINUOUS DOSING    Dispense:  91 tablet    Refill:  3    Supervising Provider:   Waymon Budge             GYN counsel adequate intake of calcium and vitamin D, diet and exercise     F/U  Return in about 1 year (around 01/17/2025).  Heather Tieu B. Satoru Milich, PA-C 01/18/2024 5:09 PM

## 2024-01-18 ENCOUNTER — Encounter: Payer: Self-pay | Admitting: Obstetrics and Gynecology

## 2024-01-18 ENCOUNTER — Ambulatory Visit (INDEPENDENT_AMBULATORY_CARE_PROVIDER_SITE_OTHER): Payer: BC Managed Care – PPO | Admitting: Obstetrics and Gynecology

## 2024-01-18 VITALS — BP 110/71 | HR 81 | Ht 61.0 in | Wt 199.0 lb

## 2024-01-18 DIAGNOSIS — R4586 Emotional lability: Secondary | ICD-10-CM

## 2024-01-18 DIAGNOSIS — Z01419 Encounter for gynecological examination (general) (routine) without abnormal findings: Secondary | ICD-10-CM

## 2024-01-18 DIAGNOSIS — Z3041 Encounter for surveillance of contraceptive pills: Secondary | ICD-10-CM

## 2024-01-18 DIAGNOSIS — N809 Endometriosis, unspecified: Secondary | ICD-10-CM

## 2024-01-18 MED ORDER — LEVONORGEST-ETH ESTRAD 91-DAY 0.15-0.03 &0.01 MG PO TABS: 1.0000 | ORAL_TABLET | Freq: Every day | ORAL | 3 refills | Status: AC

## 2024-01-18 NOTE — Patient Instructions (Signed)
 I value your feedback and you entrusting [place] with your care. If you get a [place] patient survey, I would appreciate you taking the time to let us know about your experience today. Thank you! ? ? ?

## 2024-03-01 ENCOUNTER — Other Ambulatory Visit: Payer: Self-pay | Admitting: Nurse Practitioner

## 2024-03-01 DIAGNOSIS — R519 Headache, unspecified: Secondary | ICD-10-CM

## 2024-10-13 ENCOUNTER — Ambulatory Visit: Payer: BC Managed Care – PPO | Admitting: Nurse Practitioner

## 2024-10-13 VITALS — BP 110/72 | HR 72 | Temp 99.0°F | Ht 61.0 in | Wt 201.6 lb

## 2024-10-13 DIAGNOSIS — E559 Vitamin D deficiency, unspecified: Secondary | ICD-10-CM

## 2024-10-13 DIAGNOSIS — R7303 Prediabetes: Secondary | ICD-10-CM | POA: Diagnosis not present

## 2024-10-13 DIAGNOSIS — Z0001 Encounter for general adult medical examination with abnormal findings: Secondary | ICD-10-CM | POA: Diagnosis not present

## 2024-10-13 DIAGNOSIS — R519 Headache, unspecified: Secondary | ICD-10-CM | POA: Diagnosis not present

## 2024-10-13 DIAGNOSIS — Z1329 Encounter for screening for other suspected endocrine disorder: Secondary | ICD-10-CM

## 2024-10-13 DIAGNOSIS — F439 Reaction to severe stress, unspecified: Secondary | ICD-10-CM | POA: Diagnosis not present

## 2024-10-13 DIAGNOSIS — E7800 Pure hypercholesterolemia, unspecified: Secondary | ICD-10-CM | POA: Diagnosis not present

## 2024-10-13 MED ORDER — FLUOXETINE HCL 20 MG PO CAPS: 20.0000 mg | ORAL_CAPSULE | Freq: Every day | ORAL | 0 refills | Status: AC

## 2024-10-13 NOTE — Progress Notes (Unsigned)
 " Heather Glance, NP-C Phone: [phone]  Heather Conner is a 33 y.o. female who presents today for annual exam.   Discussed the use of AI scribe software for clinical note transcription with the patient, who gave verbal consent to proceed.  History of Present Illness   Heather Conner is a 33 year old female who presents for an annual physical exam.  She experiences increased irritability and stress, which she attributes to her responsibilities as a parent of young children and her husband's health issues. Her husband's recent knee replacement surgery and her mother-in-law's poor health have disrupted her childcare arrangements, adding to her stress. She does not feel anxious or depressed but acknowledges that the stress feels more than usual. She describes feeling irritable on some days without a clear reason and acknowledges that she does not engage in much physical exercise.  She is currently taking propranolol  for headaches, which she finds effective, noting that she can tell if she misses a few days. She reports normal headaches managed with propranolol . No chest pain, shortness of breath, abdominal pain, constipation, diarrhea, burning when urinating, abnormal discharge, or painful sex. No new skin changes or rashes.  Her diet includes soda as a significant part, although she has cut back and is trying to drink more water, using electrolyte packets to help. She cooks at home more often than eating out, focusing on vegetables like broccoli and asparagus, and tries to limit pasta to once a week. She also consumes coffee and occasionally fast food due to a busy schedule. She sleeps well once she goes to bed, getting about five to six hours of sleep per night, but struggles to find time for herself during the day. She describes herself as a night owl, using the time after her children go to bed to unwind.  She has a history of endometriosis and sees her OB annually. She had a melanoma spot removed  from her back and sees a dermatologist every six months for skin checks. She also has a history of eczema, which she manages by avoiding scented products. Her father had prostate cancer, but there is no family history of breast, ovarian, or colon cancer. She does not smoke, drink alcohol, or use drugs. She sees a education officer, community and an eye doctor regularly.      Tobacco Use History[1]  Medications Ordered Prior to Encounter[2]   ROS see history of present illness  Objective  Physical Exam Vitals:   10/13/24 1515  BP: 110/72  Pulse: 72  Temp: 99 F (37.2 C)  SpO2: 98%    BP Readings from Last 3 Encounters:  10/13/24 110/72  01/18/24 110/71  10/14/23 126/88   Wt Readings from Last 3 Encounters:  10/13/24 201 lb 9.6 oz (91.4 kg)  01/18/24 199 lb (90.3 kg)  10/14/23 192 lb 6.4 oz (87.3 kg)    Physical Exam Constitutional:      General: She is not in acute distress.    Appearance: Normal appearance.  HENT:     Head: Normocephalic.     Right Ear: Tympanic membrane normal.     Left Ear: Tympanic membrane normal.     Nose: Nose normal.     Mouth/Throat:     Mouth: Mucous membranes are moist.     Pharynx: Oropharynx is clear.  Eyes:     Conjunctiva/sclera: Conjunctivae normal.     Pupils: Pupils are equal, round, and reactive to light.  Neck:     Thyroid : No thyromegaly.  Cardiovascular:  Rate and Rhythm: Normal rate and regular rhythm.     Heart sounds: Normal heart sounds.  Pulmonary:     Effort: Pulmonary effort is normal.     Breath sounds: Normal breath sounds.  Abdominal:     General: Abdomen is flat. Bowel sounds are normal.     Palpations: Abdomen is soft. There is no mass.     Tenderness: There is no abdominal tenderness.  Musculoskeletal:        General: Normal range of motion.  Lymphadenopathy:     Cervical: No cervical adenopathy.  Skin:    General: Skin is warm and dry.     Findings: No rash.  Neurological:     General: No focal deficit present.      Mental Status: She is alert.  Psychiatric:        Mood and Affect: Mood normal.        Behavior: Behavior normal.      Assessment/Plan: Please see individual problem list.  Encounter for routine adult health examination with abnormal findings Assessment & Plan: Physical exam complete. We will check lab work as outlined. Pap smear is up to date. Politely declines flu and COVID vaccines. Tetanus vaccine is up to date. Her diet is high in vegetables with limited fast food intake, but exercise is limited. Encouraged a low-carb, high-protein diet and recommended regular exercise. Continue routine dental and eye exams. Return to care in 6 weeks.   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 Discussed using SSRIs, specifically Prozac , for managing symptoms. Explained the mechanism, side effects, and potential discontinuation of SSRIs. Emphasized the importance of self-care and stress relief. Started Prozac  20 mg daily and encouraged engaging in stress-relieving activities like hobbies or exercise. Counseled patient on common side effects. Encouraged to contact if worsening symptoms, unusual behavior changes or suicidal thoughts occur. Reassess in 4-6 weeks to evaluate Prozac 's effectiveness.  Orders: -     FLUoxetine  HCl; Take 1 capsule (20 mg total) by mouth daily.  Dispense: 90 capsule; Refill: 0  Frequent headaches Assessment & Plan: Well controlled with Propranolol . Continue.    Prediabetes Assessment & Plan: Check A1c. Encourage healthy diet and regular exercise.   Orders: -     Hemoglobin A1c  Vitamin D  deficiency Assessment & Plan: Check vitamin D  level. Advise daily OTC supplementation.   Orders: -     VITAMIN D  25 Hydroxy (Vit-D Deficiency, Fractures)  Elevated LDL cholesterol level Assessment & Plan: Managed with healthy diet and regular exercise. Continue. Check lipid panel.    Orders: -     Comprehensive metabolic panel with GFR -     Lipid panel  Thyroid  disorder screen -     TSH     Return in about 6 weeks (around 11/24/2024) for Anxiety/Depression.   Heather Glance, NP-C [facility] [place]     [1]  Social History Tobacco Use  Smoking Status Never  Smokeless Tobacco Never  [2]  Current Outpatient Medications on File Prior to Visit  Medication Sig Dispense Refill   clindamycin  (CLEOCIN  T) 1 % lotion daily.     clobetasol cream (TEMOVATE) 0.05 % Apply topically 2 (two) times daily.     Levonorgestrel-Ethinyl Estradiol (AMETHIA) 0.15-0.03 &0.01 MG tablet Take 1 tablet by mouth daily. CONTINUOUS DOSING 91 tablet 3   propranolol  ER (INDERAL  LA) 120 MG 24 hr capsule TAKE 1 CAPSULE BY MOUTH  EVERY DAY 90 capsule 0   triamcinolone  cream (KENALOG ) 0.1 % Apply topically.     No current facility-administered medications on file prior to visit.   "

## 2024-10-14 LAB — CBC WITH DIFFERENTIAL/PLATELET
Basophils Absolute: 0 K/uL (ref 0.0–0.1)
Basophils Relative: 0.5 % (ref 0.0–3.0)
Eosinophils Absolute: 0.1 K/uL (ref 0.0–0.7)
Eosinophils Relative: 1.5 % (ref 0.0–5.0)
HCT: 36 % (ref 36.0–46.0)
Hemoglobin: 12.2 g/dL (ref 12.0–15.0)
Lymphocytes Relative: 35.8 % (ref 12.0–46.0)
Lymphs Abs: 2.8 K/uL (ref 0.7–4.0)
MCHC: 34 g/dL (ref 30.0–36.0)
MCV: 93 fl (ref 78.0–100.0)
Monocytes Absolute: 0.6 K/uL (ref 0.1–1.0)
Monocytes Relative: 8.1 % (ref 3.0–12.0)
Neutro Abs: 4.2 K/uL (ref 1.4–7.7)
Neutrophils Relative %: 54.1 % (ref 43.0–77.0)
Platelets: 309 K/uL (ref 150.0–400.0)
RBC: 3.87 Mil/uL (ref 3.87–5.11)
RDW: 12.8 % (ref 11.5–15.5)
WBC: 7.8 K/uL (ref 4.0–10.5)

## 2024-10-14 LAB — HEMOGLOBIN A1C: Hgb A1c MFr Bld: 6.1 % (ref 4.6–6.5)

## 2024-10-14 LAB — COMPREHENSIVE METABOLIC PANEL WITH GFR
ALT: 18 U/L (ref 3–35)
AST: 15 U/L (ref 5–37)
Albumin: 4.2 g/dL (ref 3.5–5.2)
Alkaline Phosphatase: 105 U/L (ref 39–117)
BUN: 14 mg/dL (ref 6–23)
CO2: 26 meq/L (ref 19–32)
Calcium: 9.6 mg/dL (ref 8.4–10.5)
Chloride: 104 meq/L (ref 96–112)
Creatinine, Ser: 0.85 mg/dL (ref 0.40–1.20)
GFR: 88.68 mL/min
Glucose, Bld: 86 mg/dL (ref 70–99)
Potassium: 4.3 meq/L (ref 3.5–5.1)
Sodium: 139 meq/L (ref 135–145)
Total Bilirubin: 0.3 mg/dL (ref 0.2–1.2)
Total Protein: 7.4 g/dL (ref 6.0–8.3)

## 2024-10-14 LAB — LIPID PANEL
Cholesterol: 173 mg/dL (ref 28–200)
HDL: 39.7 mg/dL
LDL Cholesterol: 109 mg/dL — ABNORMAL HIGH (ref 10–99)
NonHDL: 133.03
Total CHOL/HDL Ratio: 4
Triglycerides: 119 mg/dL (ref 10.0–149.0)
VLDL: 23.8 mg/dL (ref 0.0–40.0)

## 2024-10-14 LAB — TSH: TSH: 1.44 u[IU]/mL (ref 0.35–5.50)

## 2024-10-14 LAB — VITAMIN D 25 HYDROXY (VIT D DEFICIENCY, FRACTURES): VITD: 31.04 ng/mL (ref 30.00–100.00)

## 2024-10-19 ENCOUNTER — Ambulatory Visit: Payer: Self-pay | Admitting: Nurse Practitioner

## 2024-10-25 ENCOUNTER — Encounter: Payer: Self-pay | Admitting: Nurse Practitioner

## 2024-10-25 NOTE — Assessment & Plan Note (Signed)
 Symptoms indicate adjustment disorder due to life stressors. Discussed using SSRIs, specifically Prozac , for managing symptoms. Explained the mechanism, side effects, and potential discontinuation of SSRIs. Emphasized the importance of self-care and stress relief. Started Prozac  20 mg daily and encouraged engaging in stress-relieving activities like hobbies or exercise. Counseled patient on common side effects. Encouraged to contact if worsening symptoms, unusual behavior changes or suicidal thoughts occur. Reassess in 4-6 weeks to evaluate Prozac 's effectiveness.

## 2024-10-25 NOTE — Assessment & Plan Note (Signed)
 Physical exam complete. We will check lab work as outlined. Pap smear is up to date. Politely declines flu and COVID vaccines. Tetanus vaccine is up to date. Her diet is high in vegetables with limited fast food intake, but exercise is limited. Encouraged a low-carb, high-protein diet and recommended regular exercise. Continue routine dental and eye exams. Return to care in 6 weeks.

## 2024-10-25 NOTE — Assessment & Plan Note (Signed)
 Managed with healthy diet and regular exercise. Continue. Check lipid panel.

## 2024-10-25 NOTE — Assessment & Plan Note (Signed)
 Check A1c. Encourage healthy diet and regular exercise.

## 2024-10-25 NOTE — Assessment & Plan Note (Signed)
 Well controlled with Propranolol . Continue.

## 2024-10-25 NOTE — Assessment & Plan Note (Signed)
 Check vitamin D  level. Advise daily OTC supplementation.

## 2024-11-05 ENCOUNTER — Other Ambulatory Visit: Payer: Self-pay | Admitting: Nurse Practitioner

## 2024-11-05 DIAGNOSIS — R519 Headache, unspecified: Secondary | ICD-10-CM

## 2024-12-02 ENCOUNTER — Encounter: Payer: Self-pay | Admitting: Nurse Practitioner

## 2024-12-02 ENCOUNTER — Ambulatory Visit: Admitting: Nurse Practitioner

## 2024-12-02 VITALS — BP 100/68 | HR 83 | Temp 99.2°F | Ht 61.0 in | Wt 203.2 lb

## 2024-12-02 DIAGNOSIS — E559 Vitamin D deficiency, unspecified: Secondary | ICD-10-CM

## 2024-12-02 DIAGNOSIS — R7303 Prediabetes: Secondary | ICD-10-CM

## 2024-12-02 DIAGNOSIS — N809 Endometriosis, unspecified: Secondary | ICD-10-CM

## 2024-12-02 DIAGNOSIS — E7800 Pure hypercholesterolemia, unspecified: Secondary | ICD-10-CM

## 2024-12-02 DIAGNOSIS — F439 Reaction to severe stress, unspecified: Secondary | ICD-10-CM

## 2024-12-02 NOTE — Assessment & Plan Note (Signed)
 Severe endometriosis is managed with continuous birth control. Discussed long-term use risks and benefits. Continue current birth control regimen.

## 2024-12-02 NOTE — Assessment & Plan Note (Signed)
 Her A1c is 6.1, indicating prediabetes. Emphasized lifestyle modifications. Continue to monitor A1c levels regularly. Encourage diet and exercise to manage blood sugar levels.

## 2024-12-02 NOTE — Assessment & Plan Note (Signed)
 Her irritability and mood have improved with Prozac  20 mg. She is compliant with her medication regimen. Continue Prozac  20 mg daily. A follow-up is scheduled in three months to reassess symptoms and medication efficacy. Encouraged to contact if worsening symptoms, unusual behavior changes or suicidal thoughts occur.

## 2024-12-02 NOTE — Assessment & Plan Note (Signed)
 LDL cholesterol is elevated. The goal is to maintain LDL under 100. Emphasized lifestyle modifications. Encourage diet and exercise to manage cholesterol levels.

## 2024-12-02 NOTE — Progress Notes (Signed)
 " Leron Glance, NP-C Phone: [phone]  Discussed the use of AI scribe software for clinical note transcription with the patient, who gave verbal consent to proceed.  History of Present Illness   Heather Conner is a 34 year old female with depression who presents for a follow-up visit.  She has been on Prozac  for approximately six weeks and feels better, with less irritability and a decrease in being 'quick to get snappy.' The recent period has been atypical due to school holidays and snow days, which may have influenced her mood. She is currently taking Prozac  20 mg at night, along with propranolol  and birth control. She finds taking all her medications at night helpful.  She has a history of endometriosis, for which she underwent surgery to remove a cyst that was causing significant pain. Continuous birth control has been used to manage her symptoms, effectively reducing headaches and menstrual pain. She is concerned about the hereditary nature of endometriosis, as her mother also suffered from it.  Her recent lab work showed normal blood counts, electrolytes, kidney and liver function. Her LDL cholesterol was slightly elevated, and her vitamin D  level was low-normal. She has a family history of diabetes, with her grandfather and mother having the condition. Her A1c is 6.1.       Tobacco Use History[1]  Medications Ordered Prior to Encounter[2]   ROS see history of present illness  Objective  Physical Exam Vitals:   12/02/24 1607  BP: 100/68  Pulse: 83  Temp: 99.2 F (37.3 C)  SpO2: 98%    BP Readings from Last 3 Encounters:  12/02/24 100/68  10/13/24 110/72  01/18/24 110/71   Wt Readings from Last 3 Encounters:  12/02/24 203 lb 3.2 oz (92.2 kg)  10/13/24 201 lb 9.6 oz (91.4 kg)  01/18/24 199 lb (90.3 kg)    Physical Exam Constitutional:      General: She is not in acute distress.    Appearance: Normal appearance.  HENT:     Head: Normocephalic.  Cardiovascular:      Rate and Rhythm: Normal rate and regular rhythm.     Heart sounds: Normal heart sounds.  Pulmonary:     Effort: Pulmonary effort is normal.     Breath sounds: Normal breath sounds.  Skin:    General: Skin is warm and dry.  Neurological:     General: No focal deficit present.     Mental Status: She is alert.  Psychiatric:        Mood and Affect: Mood normal.        Behavior: Behavior normal.      Assessment/Plan: Please see individual problem list.  Stress Assessment & Plan: Her irritability and mood have improved with Prozac  20 mg. She is compliant with her medication regimen. Continue Prozac  20 mg daily. A follow-up is scheduled in three months to reassess symptoms and medication efficacy. Encouraged to contact if worsening symptoms, unusual behavior changes or suicidal thoughts occur.    Elevated LDL cholesterol level Assessment & Plan: LDL cholesterol is elevated. The goal is to maintain LDL under 100. Emphasized lifestyle modifications. Encourage diet and exercise to manage cholesterol levels.   Prediabetes Assessment & Plan: Her A1c is 6.1, indicating prediabetes. Emphasized lifestyle modifications. Continue to monitor A1c levels regularly. Encourage diet and exercise to manage blood sugar levels.   Endometriosis Assessment & Plan: Severe endometriosis is managed with continuous birth control. Discussed long-term use risks and benefits. Continue current birth control regimen.   Vitamin  D deficiency Assessment & Plan: Vitamin D  level is slightly low. Discussed importance for bone health. Continue vitamin D  supplementation, 1000-2000 IU daily.       Return in about 3 months (around 03/01/2025) for Follow up.   Leron Glance, NP-C [facility] [place]    [1]  Social History Tobacco Use  Smoking Status Never  Smokeless Tobacco Never  [2]  Current Outpatient Medications on File Prior to Visit  Medication Sig Dispense Refill    clindamycin  (CLEOCIN  T) 1 % lotion daily.     clobetasol cream (TEMOVATE) 0.05 % Apply topically 2 (two) times daily.     FLUoxetine  (PROZAC ) 20 MG capsule Take 1 capsule (20 mg total) by mouth daily. 90 capsule 0   Levonorgestrel-Ethinyl Estradiol (AMETHIA) 0.15-0.03 &0.01 MG tablet Take 1 tablet by mouth daily. CONTINUOUS DOSING 91 tablet 3   propranolol  ER (INDERAL  LA) 120 MG 24 hr capsule TAKE 1 CAPSULE BY MOUTH EVERY DAY 90 capsule 0   triamcinolone  cream (KENALOG ) 0.1 % Apply topically.     No current facility-administered medications on file prior to visit.   "

## 2024-12-02 NOTE — Assessment & Plan Note (Signed)
 Vitamin D  level is slightly low. Discussed importance for bone health. Continue vitamin D  supplementation, 1000-2000 IU daily.

## 2025-03-01 ENCOUNTER — Ambulatory Visit: Admitting: Nurse Practitioner
